# Patient Record
Sex: Female | Born: 1980 | Race: White | Hispanic: No | Marital: Married | Smoking: Never smoker
Health system: Southern US, Community
[De-identification: ages and names within clinical notes are randomized; demographics above are authoritative.]

## PROBLEM LIST (undated history)

## (undated) DIAGNOSIS — F32A Depression, unspecified: Secondary | ICD-10-CM

## (undated) DIAGNOSIS — Z6830 Body mass index (BMI) 30.0-30.9, adult: Secondary | ICD-10-CM

## (undated) DIAGNOSIS — T7840XA Allergy, unspecified, initial encounter: Secondary | ICD-10-CM

## (undated) DIAGNOSIS — M545 Low back pain, unspecified: Secondary | ICD-10-CM

## (undated) DIAGNOSIS — E559 Vitamin D deficiency, unspecified: Secondary | ICD-10-CM

## (undated) DIAGNOSIS — S0300XA Dislocation of jaw, unspecified side, initial encounter: Secondary | ICD-10-CM

## (undated) DIAGNOSIS — R739 Hyperglycemia, unspecified: Secondary | ICD-10-CM

## (undated) DIAGNOSIS — K5900 Constipation, unspecified: Secondary | ICD-10-CM

## (undated) DIAGNOSIS — Z5189 Encounter for other specified aftercare: Secondary | ICD-10-CM

## (undated) DIAGNOSIS — J302 Other seasonal allergic rhinitis: Secondary | ICD-10-CM

## (undated) DIAGNOSIS — L509 Urticaria, unspecified: Secondary | ICD-10-CM

## (undated) DIAGNOSIS — E041 Nontoxic single thyroid nodule: Secondary | ICD-10-CM

## (undated) DIAGNOSIS — L508 Other urticaria: Secondary | ICD-10-CM

## (undated) DIAGNOSIS — K219 Gastro-esophageal reflux disease without esophagitis: Secondary | ICD-10-CM

## (undated) DIAGNOSIS — F419 Anxiety disorder, unspecified: Secondary | ICD-10-CM

## (undated) HISTORY — DX: Low back pain, unspecified: M54.50

## (undated) HISTORY — DX: Low back pain: M54.5

## (undated) HISTORY — DX: Obesity, unspecified: E66.9

## (undated) HISTORY — DX: Urticaria, unspecified: L50.9

## (undated) HISTORY — DX: Constipation, unspecified: K59.00

## (undated) HISTORY — DX: Gastro-esophageal reflux disease without esophagitis: K21.9

## (undated) HISTORY — DX: Hyperlipidemia, unspecified: E78.5

## (undated) HISTORY — DX: Body mass index (BMI) 30.0-30.9, adult: Z68.30

## (undated) HISTORY — DX: Encounter for other specified aftercare: Z51.89

## (undated) HISTORY — DX: Chest pain, unspecified: R07.9

## (undated) HISTORY — DX: Depression, unspecified: F32.A

## (undated) HISTORY — DX: Other seasonal allergic rhinitis: J30.2

## (undated) HISTORY — DX: Vitamin D deficiency, unspecified: E55.9

## (undated) HISTORY — DX: Allergy, unspecified, initial encounter: T78.40XA

## (undated) HISTORY — DX: Hyperglycemia, unspecified: R73.9

## (undated) HISTORY — PX: SPINE SURGERY: SHX786

---
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
## 2004-09-19 HISTORY — PX: LUMBAR LAMINECTOMY: SHX95

## 2005-01-17 DIAGNOSIS — M51369 Other intervertebral disc degeneration, lumbar region without mention of lumbar back pain or lower extremity pain: Secondary | ICD-10-CM | POA: Insufficient documentation

## 2015-02-27 DIAGNOSIS — M519 Unspecified thoracic, thoracolumbar and lumbosacral intervertebral disc disorder: Secondary | ICD-10-CM | POA: Insufficient documentation

## 2015-04-20 DIAGNOSIS — R700 Elevated erythrocyte sedimentation rate: Secondary | ICD-10-CM | POA: Insufficient documentation

## 2015-10-12 ENCOUNTER — Other Ambulatory Visit: Payer: Self-pay | Admitting: Otolaryngology

## 2015-10-12 DIAGNOSIS — D34 Benign neoplasm of thyroid gland: Secondary | ICD-10-CM

## 2015-10-15 ENCOUNTER — Ambulatory Visit
Admission: RE | Admit: 2015-10-15 | Discharge: 2015-10-15 | Disposition: A | Discharge status: Home / Self Care | Payer: No Typology Code available for payment source | Source: Ambulatory Visit | Attending: Otolaryngology | Admitting: Otolaryngology

## 2015-10-15 DIAGNOSIS — D34 Benign neoplasm of thyroid gland: Secondary | ICD-10-CM

## 2015-10-19 ENCOUNTER — Other Ambulatory Visit: Payer: Self-pay

## 2015-11-10 ENCOUNTER — Other Ambulatory Visit (HOSPITAL_COMMUNITY): Payer: Self-pay | Admitting: Oncology

## 2015-11-10 DIAGNOSIS — F419 Anxiety disorder, unspecified: Secondary | ICD-10-CM | POA: Insufficient documentation

## 2015-11-10 DIAGNOSIS — E079 Disorder of thyroid, unspecified: Secondary | ICD-10-CM

## 2015-11-10 DIAGNOSIS — E0789 Other specified disorders of thyroid: Secondary | ICD-10-CM

## 2015-11-20 ENCOUNTER — Ambulatory Visit (HOSPITAL_COMMUNITY)
Admission: RE | Admit: 2015-11-20 | Discharge: 2015-11-20 | Disposition: A | Discharge status: Home / Self Care | Payer: PRIVATE HEALTH INSURANCE | Source: Ambulatory Visit | Attending: Oncology | Admitting: Oncology

## 2015-11-20 DIAGNOSIS — E079 Disorder of thyroid, unspecified: Secondary | ICD-10-CM

## 2015-11-20 DIAGNOSIS — E0789 Other specified disorders of thyroid: Secondary | ICD-10-CM | POA: Diagnosis not present

## 2015-11-20 MED ORDER — SODIUM PERTECHNETATE TC 99M INJECTION
11.0000 | Freq: Once | INTRAVENOUS | Status: AC | PRN
  Administered 2015-11-20: 11 via INTRAVENOUS

## 2015-11-26 ENCOUNTER — Other Ambulatory Visit (HOSPITAL_COMMUNITY): Payer: Self-pay | Admitting: Oncology

## 2015-11-26 ENCOUNTER — Other Ambulatory Visit: Payer: Self-pay | Admitting: Oncology

## 2015-11-26 DIAGNOSIS — E0789 Other specified disorders of thyroid: Secondary | ICD-10-CM

## 2015-11-26 DIAGNOSIS — E041 Nontoxic single thyroid nodule: Secondary | ICD-10-CM

## 2015-11-26 DIAGNOSIS — E079 Disorder of thyroid, unspecified: Secondary | ICD-10-CM

## 2015-12-03 ENCOUNTER — Ambulatory Visit
Admission: RE | Admit: 2015-12-03 | Discharge: 2015-12-03 | Disposition: A | Discharge status: Home / Self Care | Payer: PRIVATE HEALTH INSURANCE | Source: Ambulatory Visit | Attending: Oncology | Admitting: Oncology

## 2015-12-03 ENCOUNTER — Other Ambulatory Visit (HOSPITAL_COMMUNITY)
Admission: RE | Admit: 2015-12-03 | Discharge: 2015-12-03 | Disposition: A | Discharge status: Home / Self Care | Payer: PRIVATE HEALTH INSURANCE | Source: Ambulatory Visit | Attending: Interventional Radiology | Admitting: Interventional Radiology

## 2015-12-03 DIAGNOSIS — E041 Nontoxic single thyroid nodule: Secondary | ICD-10-CM | POA: Diagnosis present

## 2016-01-26 ENCOUNTER — Encounter: Payer: Self-pay | Admitting: Pediatrics

## 2016-01-26 ENCOUNTER — Ambulatory Visit (INDEPENDENT_AMBULATORY_CARE_PROVIDER_SITE_OTHER): Payer: PRIVATE HEALTH INSURANCE | Admitting: Pediatrics

## 2016-01-26 VITALS — BP 120/90 | HR 80 | Temp 98.7°F | Resp 16 | Ht 64.17 in | Wt 223.1 lb

## 2016-01-26 DIAGNOSIS — J301 Allergic rhinitis due to pollen: Secondary | ICD-10-CM | POA: Diagnosis not present

## 2016-01-26 DIAGNOSIS — L501 Idiopathic urticaria: Secondary | ICD-10-CM

## 2016-01-26 DIAGNOSIS — M1990 Unspecified osteoarthritis, unspecified site: Secondary | ICD-10-CM | POA: Insufficient documentation

## 2016-01-26 DIAGNOSIS — Z889 Allergy status to unspecified drugs, medicaments and biological substances status: Secondary | ICD-10-CM | POA: Insufficient documentation

## 2016-01-26 MED ORDER — EPINEPHRINE 0.3 MG/0.3ML IJ SOAJ: 0.3000 mg | Freq: Once | INTRAMUSCULAR | Status: DC

## 2016-01-26 NOTE — Progress Notes (Signed)
  [place] [id number] Dept: [phone]  FOLLOW UP NOTE  Patient ID: Christy Alexander, female    DOB: January 09, 1981  Age: 35 y.o. MRN: [id number] Date of Office Visit: 01/26/2016  Assessment Chief Complaint: Food/Drug Challenge and Food Intolerance  HPI Christy Alexander presents for evaluation of a drug allergy to nonsteroidal medications. She has been avoiding nonsteroidals medications. Several years ago after taking Naproxen, she developed swelling of the eyes and hoarseness but no hives,, difficulty swallowing or other allergic symptoms. She has had nasal congestion for several years which is worse in the springtime. She has a history of pressure related urticaria. She has developed arthritis and has been on prednisone 60 mg a day. She had a sedimentation rate of 26 mm/h, CRP 30.3, negative rheumatoid factor negative lupus screen and negative Sjogren's screening. . She did not stop her Zyrtec for 3 days before this visit.   Drug Allergies:  Allergies  Allergen Reactions  . Aspirin Swelling    hoarseness  . Chlorhexidine Dermatitis  . Nsaids Other (See Comments)    Eyes swelling/itching and loss of voice    Physical Exam: BP 120/90 mmHg  Pulse 80  Temp(Src) 98.7 F (37.1 C) (Oral)  Resp 16  Ht 5' 4.17" (1.63 m)  Wt 223 lb 1.7 oz (101.2 kg)  BMI 38.09 kg/m2   Physical Exam  Constitutional: She is oriented to person, place, and time. She appears well-developed and well-nourished.  HENT:  Eyes normal. Ears normal. Nose normal. Pharynx normal.  Neck: Neck supple. No thyromegaly present.  Cardiovascular:  S1 and S2 normal no murmurs  Pulmonary/Chest:  Clear to percussion and auscultation  Lymphadenopathy:    She has no cervical adenopathy.  Neurological: She is alert and oriented to person, place, and time.  Psychiatric: She has a normal mood and affect. Her behavior is normal. Judgment and thought content normal.  Vitals reviewed.   Diagnostics:    none Assessment and Plan: 1. Drug allergy   2. Idiopathic urticaria   3. Allergic rhinitis due to pollen   4. Arthritis         Patient Instructions  Stop Zyrtec 10 mg 3 days before the oral challenge to meloxicam. Do not take prednisone the day before the challenge or the day of the challenge    Return in about 6 days (around 02/01/2016).    Thank you for the opportunity to care for this patient.  Please do not hesitate to contact me with questions.  Penne Lash, M.D.  [facility] [address] [place], [place] [address] [phone]

## 2016-01-26 NOTE — Patient Instructions (Signed)
Stop Zyrtec 10 mg 3 days before the oral challenge to meloxicam. Do not take prednisone the day before the challenge or the day of the challenge

## 2016-01-26 NOTE — Addendum Note (Signed)
Addended by: Katherina Right D on: 01/26/2016 12:01 PM   Modules accepted: Orders

## 2016-01-29 ENCOUNTER — Encounter: Payer: PRIVATE HEALTH INSURANCE | Admitting: Pediatrics

## 2016-01-29 ENCOUNTER — Telehealth: Payer: Self-pay

## 2016-02-01 ENCOUNTER — Encounter: Payer: PRIVATE HEALTH INSURANCE | Admitting: Pediatrics

## 2016-02-02 NOTE — Telephone Encounter (Signed)
Spoke with patient about prednisone prior to her appointment with Dr. Shaune Leeks. Patient advised to take two 10mg  of prednisone BID if needed the day before appointment but none the day of.(per Dr. Shaune Leeks)

## 2016-03-08 DIAGNOSIS — K5909 Other constipation: Secondary | ICD-10-CM | POA: Insufficient documentation

## 2016-03-08 DIAGNOSIS — M519 Unspecified thoracic, thoracolumbar and lumbosacral intervertebral disc disorder: Secondary | ICD-10-CM | POA: Insufficient documentation

## 2016-03-08 DIAGNOSIS — R234 Changes in skin texture: Secondary | ICD-10-CM | POA: Insufficient documentation

## 2016-03-14 ENCOUNTER — Telehealth: Payer: Self-pay

## 2016-03-14 NOTE — Telephone Encounter (Signed)
Spoke to patient regarding coming in for the drug challenge to meloxicam in which she will be coming in on Friday at 8:30 am. She will stop the zyrtec and  Pt.hasn't had any prednisone since June 15th 2017. Pt. Will pick up meloxicam at the pharmacy and will bring for drug challenge. Dr. Shaune Leeks stated it was ok to bring pt. In to do the drug challenge.

## 2016-03-18 ENCOUNTER — Other Ambulatory Visit: Payer: Self-pay | Admitting: Pediatrics

## 2016-03-18 ENCOUNTER — Encounter: Payer: Self-pay | Admitting: Pediatrics

## 2016-03-18 ENCOUNTER — Ambulatory Visit (INDEPENDENT_AMBULATORY_CARE_PROVIDER_SITE_OTHER): Payer: PRIVATE HEALTH INSURANCE | Admitting: Pediatrics

## 2016-03-18 VITALS — BP 118/72 | HR 66 | Temp 98.0°F | Resp 20

## 2016-03-18 DIAGNOSIS — R109 Unspecified abdominal pain: Secondary | ICD-10-CM

## 2016-03-18 DIAGNOSIS — L503 Dermatographic urticaria: Secondary | ICD-10-CM | POA: Diagnosis not present

## 2016-03-18 DIAGNOSIS — Z889 Allergy status to unspecified drugs, medicaments and biological substances status: Secondary | ICD-10-CM

## 2016-03-18 NOTE — Patient Instructions (Addendum)
Zyrtec 10 mg once or twice a day if needed for itching or dermographia Do foods with salicylates make you itch? I will call you with the results of your  celiac antibody screen Meloxicam 7.5 mg once a day for 4 days. If you do well, increase meloxicam to 2 tablets equals 15 mg in the morning Call me if you are not doing well on this treatment plan

## 2016-03-18 NOTE — Progress Notes (Signed)
  [place] [address] Dept: [phone]  FOLLOW UP NOTE  Patient ID: Christy Alexander, female    DOB: 03/17/1981  Age: 35 y.o. MRN: [id number] Date of Office Visit: 03/18/2016  Assessment Chief Complaint: Food/Drug Challenge  HPI Christy Alexander presents for a gradual oral challenge to meloxicam.. When she was able to stop Zyrtec , she has noted some itching. Her other medications are outlined in the chart. She has been without any prednisone for about a week.. She has been having abdominal pain and is concerned that she may have celiac disease   Drug Allergies:  Allergies  Allergen Reactions  . Aspirin Swelling    hoarseness  . Chlorhexidine Dermatitis  . Nsaids Other (See Comments)    Eyes swelling/itching and loss of voice    Physical Exam: BP 118/72 mmHg  Pulse 66  Temp(Src) 98 F (36.7 C) (Oral)  Resp 20  SpO2 96%   Physical Exam  Constitutional: She appears well-developed and well-nourished.  HENT:  Eyes normal. Ears normal. Nose normal. Pharynx normal.  Neck: Neck supple.  Cardiovascular:  S1 and S2 normal no murmurs  Pulmonary/Chest:  Clear to percussion auscultation  Lymphadenopathy:    She has no cervical adenopathy.  Skin:  She had 2 erythematous areas around her neck. She also has dermographia  Psychiatric: She has a normal mood and affect. Her behavior is normal. Judgment and thought content normal.    Diagnostics:  She was given meloxicam 3.75 mg and 2 hours later 7.5 mg and she tolerated this challenge without any problems  Assessment and Plan: 1. Drug allergy   2. Abdominal pain, unspecified abdominal location   3. Dermographia         Patient Instructions  Zyrtec 10 mg once or twice a day if needed for itching or dermographia Do foods with salicylates make you itch? I will call you with the results of your  celiac antibody screen Meloxicam 7.5 mg once a day for 4 days. If you do well, increase meloxicam to 2 tablets equals  15 mg in the morning Call me if you are not doing well on this treatment plan    Return if symptoms worsen or fail to improve.    Thank you for the opportunity to care for this patient.  Please do not hesitate to contact me with questions.  Penne Lash, M.D.  [facility] [address] [place], [place] [address] [phone]

## 2016-03-22 LAB — CELIAC PANEL 10
Antigliadin Abs, IgA: 6 units (ref 0–19)
Endomysial IgA: NEGATIVE
Gliadin IgG: 2 units (ref 0–19)
IgA/Immunoglobulin A, Serum: 278 mg/dL (ref 87–352)
Tissue Transglut Ab: <2 U/mL (ref 0–5)
Transglutaminase IgA: <2 U/mL (ref 0–3)

## 2016-03-23 ENCOUNTER — Telehealth: Payer: Self-pay | Admitting: Allergy and Immunology

## 2016-03-23 NOTE — Progress Notes (Signed)
Oral challenge test to meloxicam was negative

## 2016-03-23 NOTE — Telephone Encounter (Signed)
Please call patient back with lab work.

## 2016-03-24 NOTE — Telephone Encounter (Signed)
Informed patient of blood test results. 

## 2016-04-28 ENCOUNTER — Encounter: Payer: Self-pay | Admitting: Allergy and Immunology

## 2016-05-10 ENCOUNTER — Other Ambulatory Visit: Payer: Self-pay | Admitting: Surgery

## 2016-05-10 DIAGNOSIS — K219 Gastro-esophageal reflux disease without esophagitis: Secondary | ICD-10-CM | POA: Insufficient documentation

## 2016-05-10 DIAGNOSIS — J302 Other seasonal allergic rhinitis: Secondary | ICD-10-CM | POA: Insufficient documentation

## 2016-05-10 DIAGNOSIS — E041 Nontoxic single thyroid nodule: Secondary | ICD-10-CM

## 2016-05-10 DIAGNOSIS — E669 Obesity, unspecified: Secondary | ICD-10-CM | POA: Insufficient documentation

## 2016-05-11 ENCOUNTER — Ambulatory Visit
Admission: RE | Admit: 2016-05-11 | Discharge: 2016-05-11 | Disposition: A | Discharge status: Home / Self Care | Payer: PRIVATE HEALTH INSURANCE | Source: Ambulatory Visit | Attending: Surgery | Admitting: Surgery

## 2016-05-11 DIAGNOSIS — E041 Nontoxic single thyroid nodule: Secondary | ICD-10-CM

## 2016-06-30 DIAGNOSIS — R7982 Elevated C-reactive protein (CRP): Secondary | ICD-10-CM | POA: Insufficient documentation

## 2016-06-30 DIAGNOSIS — M2550 Pain in unspecified joint: Secondary | ICD-10-CM | POA: Insufficient documentation

## 2016-08-18 ENCOUNTER — Telehealth: Payer: Self-pay

## 2016-08-18 NOTE — Telephone Encounter (Signed)
Patient has mold in home.  She wants to know if you can do any additional testing to see if she has been exposed to toxic mold.   Patient has seen rheumatologist for high ESR and CRP, joint pain, hair loss, dry mouth and dry eyes. All testing from rheumatologist Dr. Scarlette Shorts in [place], [place]  has been normal.  Patient has been to [facility] for a lot of testing.  All testing has been normal.  Autoimmune, opthalmology testing and all blood work is normal . Please advise.  Prefers blood testing.

## 2016-08-22 NOTE — Telephone Encounter (Signed)
Patient does want to have a blood test for mold hypersensitivity screen done.  Please mail lab order to her home.  What test do I order?

## 2016-08-22 NOTE — Telephone Encounter (Signed)
Agree 

## 2016-08-22 NOTE — Telephone Encounter (Signed)
Left message for patient to call back  

## 2016-08-22 NOTE — Telephone Encounter (Signed)
She could have a blood test for a mold hypersensitivity screen

## 2016-08-22 NOTE — Telephone Encounter (Addendum)
Per Dr. Shaune Leeks, we cannot order Hypersensitivity Pneumonitis.  We have only seen patient for possible NSAID drug intolerance.  Patient will make an OV with Dr. Shaune Leeks to discuss possible mold allergy, evaluation and treat.

## 2016-11-21 DIAGNOSIS — E785 Hyperlipidemia, unspecified: Secondary | ICD-10-CM | POA: Insufficient documentation

## 2017-03-27 DIAGNOSIS — N94819 Vulvodynia, unspecified: Secondary | ICD-10-CM | POA: Diagnosis not present

## 2017-03-27 DIAGNOSIS — E041 Nontoxic single thyroid nodule: Secondary | ICD-10-CM | POA: Diagnosis not present

## 2017-05-02 ENCOUNTER — Other Ambulatory Visit: Payer: Self-pay | Admitting: Surgery

## 2017-05-02 DIAGNOSIS — E042 Nontoxic multinodular goiter: Secondary | ICD-10-CM

## 2017-05-08 DIAGNOSIS — E042 Nontoxic multinodular goiter: Secondary | ICD-10-CM | POA: Diagnosis not present

## 2017-05-16 DIAGNOSIS — R5383 Other fatigue: Secondary | ICD-10-CM | POA: Diagnosis not present

## 2017-05-16 DIAGNOSIS — R7982 Elevated C-reactive protein (CRP): Secondary | ICD-10-CM | POA: Diagnosis not present

## 2017-05-16 DIAGNOSIS — E041 Nontoxic single thyroid nodule: Secondary | ICD-10-CM | POA: Diagnosis not present

## 2017-05-16 DIAGNOSIS — M2550 Pain in unspecified joint: Secondary | ICD-10-CM | POA: Diagnosis not present

## 2017-05-16 DIAGNOSIS — M25562 Pain in left knee: Secondary | ICD-10-CM | POA: Diagnosis not present

## 2017-05-29 DIAGNOSIS — K219 Gastro-esophageal reflux disease without esophagitis: Secondary | ICD-10-CM | POA: Diagnosis not present

## 2017-05-29 DIAGNOSIS — E041 Nontoxic single thyroid nodule: Secondary | ICD-10-CM | POA: Diagnosis not present

## 2017-05-29 DIAGNOSIS — J302 Other seasonal allergic rhinitis: Secondary | ICD-10-CM | POA: Diagnosis not present

## 2017-05-29 DIAGNOSIS — M2550 Pain in unspecified joint: Secondary | ICD-10-CM | POA: Insufficient documentation

## 2017-05-29 DIAGNOSIS — K5909 Other constipation: Secondary | ICD-10-CM | POA: Diagnosis not present

## 2017-05-29 DIAGNOSIS — Z6839 Body mass index (BMI) 39.0-39.9, adult: Secondary | ICD-10-CM | POA: Diagnosis not present

## 2017-05-29 DIAGNOSIS — Z01419 Encounter for gynecological examination (general) (routine) without abnormal findings: Secondary | ICD-10-CM | POA: Diagnosis not present

## 2017-05-29 DIAGNOSIS — Z789 Other specified health status: Secondary | ICD-10-CM | POA: Diagnosis not present

## 2017-05-29 DIAGNOSIS — Z0000 Encounter for general adult medical examination without abnormal findings: Secondary | ICD-10-CM | POA: Diagnosis not present

## 2017-05-29 DIAGNOSIS — H532 Diplopia: Secondary | ICD-10-CM | POA: Diagnosis not present

## 2017-05-29 DIAGNOSIS — E669 Obesity, unspecified: Secondary | ICD-10-CM | POA: Diagnosis not present

## 2017-05-29 DIAGNOSIS — E785 Hyperlipidemia, unspecified: Secondary | ICD-10-CM | POA: Diagnosis not present

## 2017-05-30 ENCOUNTER — Ambulatory Visit
Admission: RE | Admit: 2017-05-30 | Discharge: 2017-05-30 | Disposition: A | Discharge status: Home / Self Care | Payer: 59 | Source: Ambulatory Visit | Attending: Surgery | Admitting: Surgery

## 2017-05-30 DIAGNOSIS — E041 Nontoxic single thyroid nodule: Secondary | ICD-10-CM | POA: Diagnosis not present

## 2017-05-30 DIAGNOSIS — E042 Nontoxic multinodular goiter: Secondary | ICD-10-CM

## 2017-06-06 DIAGNOSIS — E041 Nontoxic single thyroid nodule: Secondary | ICD-10-CM | POA: Diagnosis not present

## 2017-08-31 ENCOUNTER — Telehealth: Payer: 59 | Admitting: Physician Assistant

## 2017-08-31 DIAGNOSIS — R300 Dysuria: Secondary | ICD-10-CM

## 2017-08-31 MED ORDER — CEPHALEXIN 500 MG PO CAPS: 500.0000 mg | ORAL_CAPSULE | Freq: Two times a day (BID) | ORAL | 0 refills | Status: AC

## 2017-08-31 NOTE — Progress Notes (Signed)

## 2017-09-06 DIAGNOSIS — H5213 Myopia, bilateral: Secondary | ICD-10-CM | POA: Diagnosis not present

## 2017-09-06 DIAGNOSIS — H532 Diplopia: Secondary | ICD-10-CM | POA: Diagnosis not present

## 2017-09-06 DIAGNOSIS — H43393 Other vitreous opacities, bilateral: Secondary | ICD-10-CM | POA: Diagnosis not present

## 2017-09-18 DIAGNOSIS — M542 Cervicalgia: Secondary | ICD-10-CM | POA: Diagnosis not present

## 2017-09-18 DIAGNOSIS — S139XXA Sprain of joints and ligaments of unspecified parts of neck, initial encounter: Secondary | ICD-10-CM | POA: Diagnosis not present

## 2017-09-18 DIAGNOSIS — M5090 Cervical disc disorder, unspecified, unspecified cervical region: Secondary | ICD-10-CM | POA: Diagnosis not present

## 2017-09-24 ENCOUNTER — Telehealth: Payer: 59 | Admitting: Family

## 2017-09-24 DIAGNOSIS — J019 Acute sinusitis, unspecified: Secondary | ICD-10-CM | POA: Diagnosis not present

## 2017-09-24 MED ORDER — AMOXICILLIN-POT CLAVULANATE 875-125 MG PO TABS: 1.0000 | ORAL_TABLET | Freq: Two times a day (BID) | ORAL | 0 refills | Status: DC

## 2017-09-24 NOTE — Progress Notes (Signed)
Disregard

## 2017-09-24 NOTE — Progress Notes (Signed)

## 2017-10-27 DIAGNOSIS — M542 Cervicalgia: Secondary | ICD-10-CM | POA: Diagnosis not present

## 2017-10-27 DIAGNOSIS — R7982 Elevated C-reactive protein (CRP): Secondary | ICD-10-CM | POA: Diagnosis not present

## 2017-10-27 DIAGNOSIS — M2550 Pain in unspecified joint: Secondary | ICD-10-CM | POA: Diagnosis not present

## 2017-12-13 DIAGNOSIS — E782 Mixed hyperlipidemia: Secondary | ICD-10-CM | POA: Diagnosis not present

## 2017-12-13 DIAGNOSIS — Z6841 Body Mass Index (BMI) 40.0 and over, adult: Secondary | ICD-10-CM | POA: Diagnosis not present

## 2017-12-13 DIAGNOSIS — M2550 Pain in unspecified joint: Secondary | ICD-10-CM | POA: Diagnosis not present

## 2017-12-13 DIAGNOSIS — E041 Nontoxic single thyroid nodule: Secondary | ICD-10-CM | POA: Diagnosis not present

## 2017-12-26 ENCOUNTER — Ambulatory Visit: Payer: Self-pay | Admitting: Surgery

## 2017-12-26 DIAGNOSIS — E041 Nontoxic single thyroid nodule: Secondary | ICD-10-CM | POA: Diagnosis not present

## 2017-12-28 ENCOUNTER — Other Ambulatory Visit: Payer: Self-pay | Admitting: Surgery

## 2017-12-28 DIAGNOSIS — E042 Nontoxic multinodular goiter: Secondary | ICD-10-CM

## 2018-01-03 ENCOUNTER — Encounter (HOSPITAL_COMMUNITY): Payer: Self-pay | Admitting: *Deleted

## 2018-01-08 ENCOUNTER — Ambulatory Visit
Admission: RE | Admit: 2018-01-08 | Discharge: 2018-01-08 | Disposition: A | Discharge status: Home / Self Care | Payer: 59 | Source: Ambulatory Visit | Attending: Surgery | Admitting: Surgery

## 2018-01-08 DIAGNOSIS — E041 Nontoxic single thyroid nodule: Secondary | ICD-10-CM | POA: Diagnosis not present

## 2018-01-08 DIAGNOSIS — E042 Nontoxic multinodular goiter: Secondary | ICD-10-CM

## 2018-01-25 NOTE — Patient Instructions (Addendum)
Christy Alexander  01/25/2018   Your procedure is scheduled on: 02-01-18  Report to [facility] Main  Entrance  Report to admitting at 530  AM    Call this number if you have problems the morning of surgery [phone]   Remember: Do not eat food or drink liquids :After Midnight.     Take these medicines the morning of surgery with A SIP OF WATER: NONE                               You may not have any metal on your body including hair pins and              piercings  Do not wear jewelry, make-up, lotions, powders or perfumes, deodorant             Do not wear nail polish.  Do not shave  48 hours prior to surgery.              Do not bring valuables to the hospital. [place].  Contacts, dentures or bridgework may not be worn into surgery.  Leave suitcase in the car. After surgery it may be brought to your room.                   Please read over the following fact sheets you were given: _____________________________________________________________________             [facility] - Preparing for Surgery Before surgery, you can play an important role.  Because skin is not sterile, your skin needs to be as free of germs as possible.  You can reduce the number of germs on your skin by washing with DIAL   soap before surgery. DIALis an antiseptic cleaner which kills germs and bonds with the skin to continue killing germs even after washing. Please DO NOT use if you have an allergy to DIALor antibacterial soaps.  If your skin becomes reddened/irritated stop using theDIAL and inform your nurse when you arrive at Short Stay. Do not shave (including legs and underarms) for at least 48 hours prior to the first DIAL shower.  You may shave your face/neck. Please follow these instructions carefully:  1.  Shower with DIAL Soap the night before surgery and the  morning of Surgery.  2.  If you choose to wash your hair, wash your  hair first as usual with your  normal  shampoo.  3.  After you shampoo, rinse your hair and body thoroughly to remove the  shampoo.                           4.  Use DIAL as you would any other liquid soap.  You can apply chg directly  to the skin and wash                       Gently with a scrungie or clean washcloth.  5.  Apply the DIAL Soap to your body ONLY FROM THE NECK DOWN.   Do not use on face/ open  Wound or open sores. Avoid contact with eyes, ears mouth and genitals (private parts).                       Wash face,  Genitals (private parts) with your normal soap.             6.  Wash thoroughly, paying special attention to the area where your surgery  will be performed.  7.  Thoroughly rinse your body with warm water from the neck down.  8.  DO NOT shower/wash with your normal soap after using and rinsing off  the DIAL Soap.                9.  Pat yourself dry with a clean towel.            10.  Wear clean pajamas.            11.  Place clean sheets on your bed the night of your first shower and do not  sleep with pets. Day of Surgery : Do not apply any lotions/deodorants the morning of surgery.  Please wear clean clothes to the hospital/surgery center.  FAILURE TO FOLLOW THESE INSTRUCTIONS MAY RESULT IN THE CANCELLATION OF YOUR SURGERY PATIENT SIGNATURE_________________________________  NURSE SIGNATURE__________________________________  ________________________________________________________________________

## 2018-01-26 ENCOUNTER — Encounter (HOSPITAL_COMMUNITY): Payer: Self-pay | Admitting: *Deleted

## 2018-01-26 ENCOUNTER — Ambulatory Visit (HOSPITAL_COMMUNITY)
Admission: RE | Admit: 2018-01-26 | Discharge: 2018-01-26 | Disposition: A | Discharge status: Home / Self Care | Payer: 59 | Source: Ambulatory Visit | Attending: Anesthesiology | Admitting: Anesthesiology

## 2018-01-26 ENCOUNTER — Encounter (HOSPITAL_COMMUNITY)
Admission: RE | Admit: 2018-01-26 | Discharge: 2018-01-26 | Disposition: A | Discharge status: Home / Self Care | Payer: 59 | Source: Ambulatory Visit | Attending: Surgery | Admitting: Surgery

## 2018-01-26 ENCOUNTER — Other Ambulatory Visit: Payer: Self-pay

## 2018-01-26 DIAGNOSIS — Z01818 Encounter for other preprocedural examination: Secondary | ICD-10-CM | POA: Diagnosis not present

## 2018-01-26 DIAGNOSIS — J398 Other specified diseases of upper respiratory tract: Secondary | ICD-10-CM | POA: Diagnosis not present

## 2018-01-26 DIAGNOSIS — E041 Nontoxic single thyroid nodule: Secondary | ICD-10-CM | POA: Diagnosis not present

## 2018-01-26 HISTORY — DX: Low back pain, unspecified: M54.50

## 2018-01-26 HISTORY — DX: Nontoxic single thyroid nodule: E04.1

## 2018-01-26 HISTORY — DX: Low back pain: M54.5

## 2018-01-26 HISTORY — DX: Gastro-esophageal reflux disease without esophagitis: K21.9

## 2018-01-26 HISTORY — DX: Other urticaria: L50.8

## 2018-01-26 HISTORY — DX: Anxiety disorder, unspecified: F41.9

## 2018-01-26 LAB — CBC
HCT: 42.4 % (ref 36.0–46.0)
Hemoglobin: 14 g/dL (ref 12.0–15.0)
MCH: 29.6 pg (ref 26.0–34.0)
MCHC: 33 g/dL (ref 30.0–36.0)
MCV: 89.6 fL (ref 78.0–100.0)
Platelets: 286 10*3/uL (ref 150–400)
RBC: 4.73 MIL/uL (ref 3.87–5.11)
RDW: 12.9 % (ref 11.5–15.5)
WBC: 8.1 10*3/uL (ref 4.0–10.5)

## 2018-01-26 LAB — HCG, SERUM, QUALITATIVE: Preg, Serum: NEGATIVE

## 2018-01-26 NOTE — Consult Note (Signed)
Was asked to see Christy Alexander as a preoperative consult for her Delayed Pressure Urticaria, she suffers from. 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 It has only involved her fingers and lower extremeties but she is concerned about her surgery and intubation causing this reaction.  Overweight female with a normal airway. She does have some TMJ symptoms. She brought some case reports with her, which suggest pretreatment with steroids, H1 and H2 blockers.  I answered all her questions and discussed her case with Dr Roanna [place] who agreed to care for her as he is scheduled at [facility] next Thursday.  Lillia Abed MD

## 2018-01-27 ENCOUNTER — Encounter (HOSPITAL_COMMUNITY): Payer: Self-pay | Admitting: Surgery

## 2018-01-27 DIAGNOSIS — E041 Nontoxic single thyroid nodule: Secondary | ICD-10-CM | POA: Diagnosis present

## 2018-01-27 NOTE — H&P (Signed)
[facility]  Christy Alexander DOB: [birth year] Married / Language: English / Race: [place] Female   History of Present Illness  The patient is a 38 year old female who presents with a thyroid nodule.  CC: dominant thyroid nodule with compressive symptoms  Patient returns for scheduled follow-up. She has a known right sided dominant thyroid nodule which measures nearly 5 cm in dimension. Left thyroid lobe is relatively normal with only a small cyst noted on previous ultrasound examinations. She has not had a recent ultrasound study. Patient was last evaluated in September 2018. At that time she was having mild to moderate compressive symptoms. Over the past 6 months she has noted increased symptoms including dysphagia and she has developed some discomfort in the right neck. She notes positional discomfort in the right neck. At this time she would like to proceed with thyroid surgery. We had previously discussed right thyroid lobectomy. However, the patient is concerned about the cyst noted in the left thyroid lobe on previous examinations. She would like to have her thyroid ultrasound repeated prior to surgery. We will arrange for that study in the near future. TSH level has been normal. Patient does not take thyroid hormone supplementation.   Problem List/Past Medical  MULTIPLE THYROID NODULES (E04.2)  RIGHT THYROID NODULE (E04.1)   Past Surgical History Oral Surgery  Spinal Surgery - Lower Back   Diagnostic Studies History Colonoscopy  1-5 years ago Mammogram  never Pap Smear  1-5 years ago  Allergies Aspirin *ANALGESICS - NonNarcotic*  NSAIDs  Chlorhexidine *PHARMACEUTICAL ADJUVANTS*  Allergies Reconciled   Medication History Mobic (7.5MG  Tablet, Oral as needed) Active. Vitamin C (Oral) Specific strength unknown - Active. EPINEPHrine (0.3MG /0.3ML Soln Auto-inj, Injection as needed) Active. ZyrTEC Allergy (10MG   Capsule, Oral) Active. Docusate Calcium (240MG  Capsule, Oral) Active. Medications Reconciled  Social History Caffeine use  Coffee, Tea. No alcohol use  No drug use  Tobacco use  Never smoker.  Family History Anesthetic complications  Father. Cancer  Father. Diabetes Mellitus  Brother, Mother. Heart Disease  Father, Mother. Heart disease in female family member before age 85  Hypertension  Father, Mother. Ischemic Bowel Disease  Family Members In General.  Pregnancy / Birth History Age at menarche  12 years. Contraceptive History  Oral contraceptives. Gravida  0 Irregular periods  Para  0  Other Problems Anxiety Disorder  Arthritis  Back Pain  Gastroesophageal Reflux Disease  Hemorrhoids  Thyroid Disease   Vitals Weight: 239.2 lb Height: 64in Body Surface Area: 2.11 m Body Mass Index: 41.06 kg/m  Temp.: 98.1F  Pulse: 102 (Regular)  BP: 124/86 (Sitting, Left Arm, Standard)  Physical Exam  See vital signs recorded above  GENERAL APPEARANCE Development: normal Nutritional status: normal Gross deformities: none  SKIN Rash, lesions, ulcers: none Induration, erythema: none Nodules: none palpable  EYES Conjunctiva and lids: normal Pupils: equal and reactive Iris: normal bilaterally  EARS, NOSE, MOUTH, THROAT External ears: no lesion or deformity External nose: no lesion or deformity Hearing: grossly normal Lips: no lesion or deformity Dentition: normal for age Oral mucosa: moist  NECK Symmetric: no Trachea: midline Thyroid: Dominant mass occupying most of the right thyroid lobe, smooth, firm, approximately 5 cm in size, mobile with swallowing. Left thyroid lobe without palpable abnormality.  CHEST Respiratory effort: normal Retraction or accessory muscle use: no Breath sounds: normal bilaterally Rales, rhonchi, wheeze: none  CARDIOVASCULAR Auscultation: regular rhythm, normal rate Murmurs: none Pulses:  carotid and radial pulse 2+  palpable Lower extremity edema: none Lower extremity varicosities: none  MUSCULOSKELETAL Station and gait: normal Digits and nails: no clubbing or cyanosis Muscle strength: grossly normal all extremities Range of motion: grossly normal all extremities Deformity: none  LYMPHATIC Cervical: none palpable Supraclavicular: none palpable  PSYCHIATRIC Oriented to person, place, and time: yes Mood and affect: normal for situation Judgment and insight: appropriate for situation    Assessment & Plan  RIGHT THYROID NODULE (E04.1)  Patient returns with a dominant right sided thyroid nodule and progression in her compressive symptoms. We discussed proceeding with either thyroid lobectomy or total thyroidectomy. Patient would like to repeat her thyroid ultrasound prior to making a final decision on the surgical procedure. We will arrange for that study and the near future and intact the patient with results when they are available.  We discussed thyroid lobectomy versus total thyroidectomy. We discussed the risk and benefits of the procedure including the risk of recurrent laryngeal nerve injury and injury to parathyroid glands. We discussed the hospital stay to be anticipated. We discussed the location and size of her surgical incision. We discussed the potential need for lifelong thyroid hormone replacement. Patient understands and wishes to proceed with surgery in the near future.  Patient has concerns regarding reactive airway and immune mediated soft tissue swelling which could lead to airway compromise. We will arrange for preoperative anesthesia consultation to discuss these issues prior to surgery.  The risks and benefits of the procedure have been discussed at length with the patient. The patient understands the proposed procedure, potential alternative treatments, and the course of recovery to be expected. All of the patient's questions have been  answered at this time. The patient wishes to proceed with surgery.   Christy Alexander, [place] [facility] Office: [phone]

## 2018-01-30 ENCOUNTER — Encounter (HOSPITAL_COMMUNITY): Payer: Self-pay | Admitting: *Deleted

## 2018-02-01 ENCOUNTER — Encounter (HOSPITAL_COMMUNITY): Payer: Self-pay | Admitting: *Deleted

## 2018-02-01 ENCOUNTER — Observation Stay (HOSPITAL_COMMUNITY)
Admission: RE | Admit: 2018-02-01 | Discharge: 2018-02-02 | Disposition: A | Discharge status: Home / Self Care | Payer: 59 | Source: Ambulatory Visit | Attending: Surgery | Admitting: Surgery

## 2018-02-01 ENCOUNTER — Ambulatory Visit (HOSPITAL_COMMUNITY): Payer: 59 | Admitting: Anesthesiology

## 2018-02-01 ENCOUNTER — Encounter (HOSPITAL_COMMUNITY)
Admission: RE | Disposition: A | Discharge status: Home / Self Care | Payer: Self-pay | Source: Ambulatory Visit | Attending: Surgery

## 2018-02-01 ENCOUNTER — Other Ambulatory Visit: Payer: Self-pay

## 2018-02-01 DIAGNOSIS — F419 Anxiety disorder, unspecified: Secondary | ICD-10-CM | POA: Insufficient documentation

## 2018-02-01 DIAGNOSIS — L501 Idiopathic urticaria: Secondary | ICD-10-CM | POA: Insufficient documentation

## 2018-02-01 DIAGNOSIS — M1990 Unspecified osteoarthritis, unspecified site: Secondary | ICD-10-CM | POA: Insufficient documentation

## 2018-02-01 DIAGNOSIS — E041 Nontoxic single thyroid nodule: Principal | ICD-10-CM | POA: Diagnosis present

## 2018-02-01 DIAGNOSIS — Z888 Allergy status to other drugs, medicaments and biological substances status: Secondary | ICD-10-CM | POA: Diagnosis not present

## 2018-02-01 DIAGNOSIS — K219 Gastro-esophageal reflux disease without esophagitis: Secondary | ICD-10-CM | POA: Diagnosis not present

## 2018-02-01 DIAGNOSIS — Z6841 Body Mass Index (BMI) 40.0 and over, adult: Secondary | ICD-10-CM | POA: Diagnosis not present

## 2018-02-01 DIAGNOSIS — Z886 Allergy status to analgesic agent status: Secondary | ICD-10-CM | POA: Insufficient documentation

## 2018-02-01 DIAGNOSIS — Z79899 Other long term (current) drug therapy: Secondary | ICD-10-CM | POA: Insufficient documentation

## 2018-02-01 DIAGNOSIS — D34 Benign neoplasm of thyroid gland: Secondary | ICD-10-CM | POA: Diagnosis not present

## 2018-02-01 DIAGNOSIS — Z91041 Radiographic dye allergy status: Secondary | ICD-10-CM | POA: Insufficient documentation

## 2018-02-01 HISTORY — PX: THYROID LOBECTOMY: SHX420

## 2018-02-01 HISTORY — DX: Dislocation of jaw, unspecified side, initial encounter: S03.00XA

## 2018-02-01 SURGERY — LOBECTOMY, THYROID
Anesthesia: General | Site: Neck | Laterality: Right

## 2018-02-01 MED ORDER — HYDROMORPHONE HCL 1 MG/ML IJ SOLN
INTRAMUSCULAR | Status: AC
  Filled 2018-02-01: qty 1

## 2018-02-01 MED ORDER — PHENYLEPHRINE 40 MCG/ML (10ML) SYRINGE FOR IV PUSH (FOR BLOOD PRESSURE SUPPORT)
PREFILLED_SYRINGE | INTRAVENOUS | Status: AC
  Filled 2018-02-01: qty 10

## 2018-02-01 MED ORDER — ROCURONIUM BROMIDE 10 MG/ML (PF) SYRINGE
PREFILLED_SYRINGE | INTRAVENOUS | Status: AC
  Filled 2018-02-01: qty 5

## 2018-02-01 MED ORDER — FENTANYL CITRATE (PF) 100 MCG/2ML IJ SOLN
INTRAMUSCULAR | Status: DC | PRN
  Administered 2018-02-01 (×2): 50 ug via INTRAVENOUS
  Administered 2018-02-01: 150 ug via INTRAVENOUS
  Administered 2018-02-01 (×2): 50 ug via INTRAVENOUS

## 2018-02-01 MED ORDER — ONDANSETRON HCL 4 MG/2ML IJ SOLN
4.0000 mg | Freq: Four times a day (QID) | INTRAMUSCULAR | Status: DC | PRN
  Administered 2018-02-01: 4 mg via INTRAVENOUS
  Filled 2018-02-01: qty 2

## 2018-02-01 MED ORDER — DIPHENHYDRAMINE HCL 50 MG/ML IJ SOLN
25.0000 mg | Freq: Once | INTRAMUSCULAR | Status: AC
  Administered 2018-02-01: 07:00:00 via INTRAVENOUS
  Filled 2018-02-01: qty 1

## 2018-02-01 MED ORDER — DEXAMETHASONE SODIUM PHOSPHATE 10 MG/ML IJ SOLN
INTRAMUSCULAR | Status: DC | PRN
  Administered 2018-02-01: 10 mg via INTRAVENOUS

## 2018-02-01 MED ORDER — ACETAMINOPHEN 500 MG PO TABS
1000.0000 mg | ORAL_TABLET | Freq: Once | ORAL | Status: AC
  Administered 2018-02-01: 1000 mg via ORAL
  Filled 2018-02-01: qty 2

## 2018-02-01 MED ORDER — EPHEDRINE 5 MG/ML INJ
INTRAVENOUS | Status: AC
  Filled 2018-02-01: qty 10

## 2018-02-01 MED ORDER — ACETAMINOPHEN 650 MG RE SUPP: 650.0000 mg | Freq: Four times a day (QID) | RECTAL | Status: DC | PRN

## 2018-02-01 MED ORDER — HYDROMORPHONE HCL 1 MG/ML IJ SOLN
1.0000 mg | INTRAMUSCULAR | Status: DC | PRN
  Administered 2018-02-01: 1 mg via INTRAVENOUS

## 2018-02-01 MED ORDER — CEFAZOLIN SODIUM-DEXTROSE 2-4 GM/100ML-% IV SOLN
2.0000 g | INTRAVENOUS | Status: AC
  Administered 2018-02-01: 2 g via INTRAVENOUS
  Filled 2018-02-01: qty 100

## 2018-02-01 MED ORDER — ONDANSETRON HCL 4 MG/2ML IJ SOLN
INTRAMUSCULAR | Status: AC
  Filled 2018-02-01: qty 2

## 2018-02-01 MED ORDER — HYDROMORPHONE HCL 1 MG/ML IJ SOLN
0.2500 mg | INTRAMUSCULAR | Status: DC | PRN
  Administered 2018-02-01 (×4): 0.5 mg via INTRAVENOUS
  Filled 2018-02-01: qty 1

## 2018-02-01 MED ORDER — HYDROMORPHONE HCL 1 MG/ML IJ SOLN
INTRAMUSCULAR | Status: AC
  Administered 2018-02-01: 0.5 mg via INTRAVENOUS
  Filled 2018-02-01: qty 2

## 2018-02-01 MED ORDER — MIDAZOLAM HCL 5 MG/5ML IJ SOLN
INTRAMUSCULAR | Status: DC | PRN
  Administered 2018-02-01: 1 mg via INTRAVENOUS

## 2018-02-01 MED ORDER — 0.9 % SODIUM CHLORIDE (POUR BTL) OPTIME
TOPICAL | Status: DC | PRN
  Administered 2018-02-01: 1000 mL

## 2018-02-01 MED ORDER — PROPOFOL 10 MG/ML IV BOLUS
INTRAVENOUS | Status: AC
  Filled 2018-02-01: qty 20

## 2018-02-01 MED ORDER — SUCCINYLCHOLINE CHLORIDE 200 MG/10ML IV SOSY
PREFILLED_SYRINGE | INTRAVENOUS | Status: AC
  Filled 2018-02-01: qty 10

## 2018-02-01 MED ORDER — LIDOCAINE 2% (20 MG/ML) 5 ML SYRINGE
INTRAMUSCULAR | Status: AC
  Filled 2018-02-01: qty 5

## 2018-02-01 MED ORDER — PROPOFOL 10 MG/ML IV BOLUS
INTRAVENOUS | Status: DC | PRN
  Administered 2018-02-01: 200 mg via INTRAVENOUS

## 2018-02-01 MED ORDER — CHLORHEXIDINE GLUCONATE CLOTH 2 % EX PADS: 6.0000 | MEDICATED_PAD | Freq: Once | CUTANEOUS | Status: DC

## 2018-02-01 MED ORDER — TRAMADOL HCL 50 MG PO TABS: 50.0000 mg | ORAL_TABLET | Freq: Four times a day (QID) | ORAL | Status: DC | PRN

## 2018-02-01 MED ORDER — OXYCODONE HCL 5 MG/5ML PO SOLN
5.0000 mg | Freq: Once | ORAL | Status: AC | PRN
  Filled 2018-02-01: qty 5

## 2018-02-01 MED ORDER — SUGAMMADEX SODIUM 200 MG/2ML IV SOLN
INTRAVENOUS | Status: AC
  Filled 2018-02-01: qty 2

## 2018-02-01 MED ORDER — ONDANSETRON 4 MG PO TBDP: 4.0000 mg | ORAL_TABLET | Freq: Four times a day (QID) | ORAL | Status: DC | PRN

## 2018-02-01 MED ORDER — OXYCODONE HCL 5 MG PO TABS
5.0000 mg | ORAL_TABLET | Freq: Once | ORAL | Status: AC | PRN
  Administered 2018-02-01: 5 mg via ORAL
  Filled 2018-02-01: qty 1

## 2018-02-01 MED ORDER — ONDANSETRON HCL 4 MG/2ML IJ SOLN
INTRAMUSCULAR | Status: DC | PRN
Start: 2018-02-01 — End: 2018-02-01
  Administered 2018-02-01: 4 mg via INTRAVENOUS

## 2018-02-01 MED ORDER — PROMETHAZINE HCL 25 MG/ML IJ SOLN
6.2500 mg | INTRAMUSCULAR | Status: DC | PRN
Start: 2018-02-01 — End: 2018-02-01
  Administered 2018-02-01: 6.25 mg via INTRAVENOUS

## 2018-02-01 MED ORDER — SUGAMMADEX SODIUM 200 MG/2ML IV SOLN
INTRAVENOUS | Status: DC | PRN
  Administered 2018-02-01: 200 mg via INTRAVENOUS

## 2018-02-01 MED ORDER — FENTANYL CITRATE (PF) 250 MCG/5ML IJ SOLN
INTRAMUSCULAR | Status: AC
  Filled 2018-02-01: qty 5

## 2018-02-01 MED ORDER — FAMOTIDINE 20 MG PO TABS
20.0000 mg | ORAL_TABLET | Freq: Once | ORAL | Status: AC
  Administered 2018-02-01: 20 mg via ORAL
  Filled 2018-02-01: qty 1

## 2018-02-01 MED ORDER — ACETAMINOPHEN 160 MG/5ML PO SOLN: 960.0000 mg | Freq: Once | ORAL | Status: AC

## 2018-02-01 MED ORDER — KCL IN DEXTROSE-NACL 20-5-0.45 MEQ/L-%-% IV SOLN
INTRAVENOUS | Status: DC
  Administered 2018-02-01: 15:00:00 via INTRAVENOUS
  Filled 2018-02-01: qty 1000

## 2018-02-01 MED ORDER — EPINEPHRINE 0.3 MG/0.3ML IJ SOAJ: 0.3000 mg | Freq: Every day | INTRAMUSCULAR | Status: DC | PRN

## 2018-02-01 MED ORDER — DEXAMETHASONE SODIUM PHOSPHATE 10 MG/ML IJ SOLN
INTRAMUSCULAR | Status: AC
  Filled 2018-02-01: qty 1

## 2018-02-01 MED ORDER — PROMETHAZINE HCL 25 MG/ML IJ SOLN
INTRAMUSCULAR | Status: AC
  Filled 2018-02-01: qty 1

## 2018-02-01 MED ORDER — FENTANYL CITRATE (PF) 100 MCG/2ML IJ SOLN
INTRAMUSCULAR | Status: AC
  Filled 2018-02-01: qty 2

## 2018-02-01 MED ORDER — PROMETHAZINE HCL 25 MG/ML IJ SOLN
12.5000 mg | Freq: Four times a day (QID) | INTRAMUSCULAR | Status: DC | PRN
Start: 2018-02-01 — End: 2018-02-02
  Administered 2018-02-01: 12.5 mg via INTRAVENOUS
  Filled 2018-02-01: qty 1

## 2018-02-01 MED ORDER — LIDOCAINE 2% (20 MG/ML) 5 ML SYRINGE
INTRAMUSCULAR | Status: DC | PRN
  Administered 2018-02-01: 50 mg via INTRAVENOUS

## 2018-02-01 MED ORDER — ROCURONIUM BROMIDE 50 MG/5ML IV SOSY
PREFILLED_SYRINGE | INTRAVENOUS | Status: DC | PRN
  Administered 2018-02-01: 40 mg via INTRAVENOUS
  Administered 2018-02-01: 20 mg via INTRAVENOUS

## 2018-02-01 MED ORDER — HYDROCODONE-ACETAMINOPHEN 5-325 MG PO TABS
1.0000 | ORAL_TABLET | ORAL | Status: DC | PRN
  Administered 2018-02-01 – 2018-02-02 (×4): 1 via ORAL
  Filled 2018-02-01 (×4): qty 1

## 2018-02-01 MED ORDER — LACTATED RINGERS IV SOLN
INTRAVENOUS | Status: DC
  Administered 2018-02-01 (×2): via INTRAVENOUS

## 2018-02-01 MED ORDER — DIPHENHYDRAMINE HCL 25 MG PO CAPS: 25.0000 mg | ORAL_CAPSULE | Freq: Four times a day (QID) | ORAL | Status: DC | PRN

## 2018-02-01 MED ORDER — ACETAMINOPHEN 325 MG PO TABS: 650.0000 mg | ORAL_TABLET | Freq: Four times a day (QID) | ORAL | Status: DC | PRN

## 2018-02-01 MED ORDER — MIDAZOLAM HCL 2 MG/2ML IJ SOLN
INTRAMUSCULAR | Status: AC
  Filled 2018-02-01: qty 2

## 2018-02-01 SURGICAL SUPPLY — 34 items
ATTRACTOMAT 16X20 MAGNETIC DRP (DRAPES) ×2 IMPLANT
BLADE SURG 15 STRL LF DISP TIS (BLADE) ×1 IMPLANT
BLADE SURG 15 STRL SS (BLADE) ×1
CHLORAPREP W/TINT 26ML (MISCELLANEOUS) ×4 IMPLANT
CLIP VESOCCLUDE MED 6/CT (CLIP) ×4 IMPLANT
CLIP VESOCCLUDE SM WIDE 6/CT (CLIP) ×4 IMPLANT
DISSECTOR ROUND CHERRY 3/8 STR (MISCELLANEOUS) IMPLANT
DRAPE LAPAROTOMY T 98X78 PEDS (DRAPES) ×2 IMPLANT
ELECT PENCIL ROCKER SW 15FT (MISCELLANEOUS) ×2 IMPLANT
ELECT REM PT RETURN 15FT ADLT (MISCELLANEOUS) ×2 IMPLANT
GAUZE 4X4 16PLY RFD (DISPOSABLE) ×4 IMPLANT
GAUZE SPONGE 4X4 12PLY STRL (GAUZE/BANDAGES/DRESSINGS) ×2 IMPLANT
GLOVE SURG ORTHO 8.0 STRL STRW (GLOVE) ×2 IMPLANT
GOWN STRL REUS W/TWL XL LVL3 (GOWN DISPOSABLE) ×4 IMPLANT
HEMOSTAT SURGICEL 2X4 FIBR (HEMOSTASIS) ×2 IMPLANT
ILLUMINATOR WAVEGUIDE N/F (MISCELLANEOUS) ×2 IMPLANT
KIT BASIN OR (CUSTOM PROCEDURE TRAY) ×2 IMPLANT
LIGHT WAVEGUIDE WIDE FLAT (MISCELLANEOUS) IMPLANT
PACK BASIC VI WITH GOWN DISP (CUSTOM PROCEDURE TRAY) ×2 IMPLANT
POWDER SURGICEL 3.0 GRAM (HEMOSTASIS) IMPLANT
SHEARS HARMONIC 9CM CVD (BLADE) ×2 IMPLANT
STAPLER VISISTAT 35W (STAPLE) ×2 IMPLANT
STRIP CLOSURE SKIN 1/2X4 (GAUZE/BANDAGES/DRESSINGS) ×2 IMPLANT
SUT MNCRL AB 4-0 PS2 18 (SUTURE) ×2 IMPLANT
SUT SILK 2 0 (SUTURE) ×1
SUT SILK 2-0 18XBRD TIE 12 (SUTURE) ×1 IMPLANT
SUT SILK 3 0 (SUTURE)
SUT SILK 3-0 18XBRD TIE 12 (SUTURE) IMPLANT
SUT VIC AB 3-0 SH 18 (SUTURE) ×2 IMPLANT
SYR BULB IRRIGATION 50ML (SYRINGE) ×2 IMPLANT
TAPE CLOTH SURG 4X10 WHT LF (GAUZE/BANDAGES/DRESSINGS) ×2 IMPLANT
TOWEL OR 17X26 10 PK STRL BLUE (TOWEL DISPOSABLE) ×2 IMPLANT
TOWEL OR NON WOVEN STRL DISP B (DISPOSABLE) ×2 IMPLANT
YANKAUER SUCT BULB TIP 10FT TU (MISCELLANEOUS) ×2 IMPLANT

## 2018-02-01 NOTE — Anesthesia Preprocedure Evaluation (Addendum)
Anesthesia Evaluation  Patient identified by MRN, date of birth, ID band Patient awake    Reviewed: Allergy & Precautions, NPO status , Patient's Chart, lab work & pertinent test results  Airway Mallampati: II  TM Distance: >3 FB Neck ROM: Full    Dental no notable dental hx.    Pulmonary neg pulmonary ROS,    Pulmonary exam normal breath sounds clear to auscultation       Cardiovascular negative cardio ROS Normal cardiovascular exam Rhythm:Regular Rate:Normal     Neuro/Psych Anxiety negative neurological ROS     GI/Hepatic Neg liver ROS, GERD  Controlled,  Endo/Other  Morbid obesityIdiopathic urticaria  Renal/GU negative Renal ROS     Musculoskeletal Lower back pain   Abdominal (+) + obese,   Peds  Hematology negative hematology ROS (+)   Anesthesia Other Findings RIGHT THYROID NODULE  Reproductive/Obstetrics hcg negative                            Anesthesia Physical Anesthesia Plan  ASA: III  Anesthesia Plan: General   Post-op Pain Management:    Induction: Intravenous  PONV Risk Score and Plan: 3 and Midazolam, Dexamethasone, Ondansetron and Treatment may vary due to age or medical condition  Airway Management Planned: Oral ETT and Video Laryngoscope Planned  Additional Equipment:   Intra-op Plan:   Post-operative Plan: Extubation in OR and Possible Post-op intubation/ventilation  Informed Consent: I have reviewed the patients History and Physical, chart, labs and discussed the procedure including the risks, benefits and alternatives for the proposed anesthesia with the patient or authorized representative who has indicated his/her understanding and acceptance.   Dental advisory given  Plan Discussed with: CRNA and Surgeon  Anesthesia Plan Comments: (Case discussed with Dr. Conrad [place] and Dr. Harlow Asa)        Anesthesia Quick Evaluation

## 2018-02-01 NOTE — Progress Notes (Signed)
Dr. Roanna Banning in to see pt. Ok to discharge to floor, room 1534

## 2018-02-01 NOTE — Progress Notes (Signed)
Report given at bedside to [place].

## 2018-02-01 NOTE — Interval H&P Note (Signed)
History and Physical Interval Note:  02/01/2018 7:02 AM  Christy Alexander  has presented today for surgery, with the diagnosis of RIGHT THYROID NODULE.  The various methods of treatment have been discussed with the patient and family. After consideration of risks, benefits and other options for treatment, the patient has consented to    Procedure(s): RIGHT THYROID LOBECTOMY (Right) as a surgical intervention.    The patient's history has been reviewed, patient examined, no change in status, stable for surgery.  I have reviewed the patient's chart and labs.  Questions were answered to the patient's satisfaction.    Armandina Gemma, [place] [facility] Office: [place]

## 2018-02-01 NOTE — Op Note (Signed)
Procedure Note  Pre-operative Diagnosis:  Right thyroid nodule  Post-operative Diagnosis:  same  Surgeon:  Armandina Gemma, MD  Assistant:  none   Procedure:  Right thyroid lobectomy  Anesthesia:  General  Estimated Blood Loss:  minimal  Drains: none         Specimen: thyroid lobe to pathology  Indications:  Patient returns for scheduled follow-up. She has a known right sided dominant thyroid nodule which measures nearly 5 cm in dimension. Left thyroid lobe is relatively normal with only a small cyst noted on previous ultrasound examinations. She has not had a recent ultrasound study. Patient was last evaluated in September 2018. At that time she was having mild to moderate compressive symptoms. Over the past 6 months she has noted increased symptoms including dysphagia and she has developed some discomfort in the right neck. She notes positional discomfort in the right neck. At this time she would like to proceed with thyroid surgery.  Procedure Details: Procedure was done in OR #5 at the [facility].  The patient was brought to the operating room and placed in a supine position on the operating room table.  Following administration of general anesthesia, the patient was positioned and then prepped and draped in the usual aseptic fashion.  After ascertaining that an adequate level of anesthesia had been achieved, a Kocher incision was made with #15 blade.  Dissection was carried through subcutaneous tissues and platysma. Hemostasis was achieved with the electrocautery.  Skin flaps were elevated cephalad and caudad from the thyroid notch to the sternal notch.  A self-retaining retractor was placed for exposure.  Strap muscles were incised in the midline and dissection was begun on the right side.  Strap muscles were reflected laterally.  The right thyroid lobe was moderately enlarged with a dominant nodule in the mid and inferior portion of the lobe.  The lobe was gently mobilized  with blunt dissection.  Superior pole vessels were dissected out and divided individually between small and medium Ligaclips with the Harmonic scalpel.  The thyroid lobe was rolled anteriorly.  Branches of the inferior thyroid artery were divided between small Ligaclips with the Harmonic scalpel.  Inferior venous tributaries were divided between Ligaclips.  Both the superior and inferior parathyroid glands were identified and preserved on their vascular pedicles.  The recurrent laryngeal nerve was identified and preserved along its course.  The ligament of Gwenlyn Found was released with the electrocautery and the gland was mobilized onto the anterior trachea. Isthmus was mobilized across the midline.  There was a small pyramidal lobe present which was resected with the thyroid isthmus.  The thyroid parenchyma was transected at the junction of the isthmus and contralateral thyroid lobe with the Harmonic scalpel.  The thyroid lobe and isthmus were submitted to pathology for review.  The neck was irrigated with warm saline.  Fibrillar was placed throughout the operative field.  Strap muscles were reapproximated in the midline with interrupted 3-0 Vicryl sutures.  Platysma was closed with interrupted 3-0 Vicryl sutures.  Skin was closed with a running 4-0 Monocryl subcuticular suture.  Wound was washed and dried and steri-strips were applied.  Dry gauze dressing was placed.  The patient was awakened from anesthesia and brought to the recovery room.  The patient tolerated the procedure well.   Armandina Gemma, MD [facility] Office: [phone]

## 2018-02-01 NOTE — Anesthesia Procedure Notes (Signed)
Procedure Name: Intubation Date/Time: 02/01/2018 7:50 AM Performed by: Maxwell Caul, CRNA Pre-anesthesia Checklist: Patient identified, Emergency Drugs available, Suction available and Patient being monitored Patient Re-evaluated:Patient Re-evaluated prior to induction Oxygen Delivery Method: Circle system utilized Preoxygenation: Pre-oxygenation with 100% oxygen Induction Type: IV induction Ventilation: Mask ventilation without difficulty Laryngoscope Size: Glidescope Grade View: Grade I Tube type: Oral Tube size: 7.0 mm Airway Equipment and Method: Video-laryngoscopy and Stylet Placement Confirmation: ETT inserted through vocal cords under direct vision,  positive ETCO2,  CO2 detector and breath sounds checked- equal and bilateral Secured at: 22 cm Tube secured with: Tape Dental Injury: Bloody posterior oropharynx

## 2018-02-01 NOTE — Transfer of Care (Signed)
Immediate Anesthesia Transfer of Care Note  Patient: Christy Alexander  Procedure(s) Performed: RIGHT THYROID LOBECTOMY (Right Neck)  Patient Location: PACU  Anesthesia Type:General  Level of Consciousness: awake, alert  and oriented  Airway & Oxygen Therapy: Patient Spontanous Breathing and Patient connected to face mask oxygen  Post-op Assessment: Report given to RN and Post -op Vital signs reviewed and stable  Post vital signs: Reviewed and stable  Last Vitals:  Vitals Value Taken Time  BP    Temp    Pulse    Resp    SpO2      Last Pain:  Vitals:   02/01/18 0546  TempSrc: Oral      Patients Stated Pain Goal: 4 (27/07/86 7544)  Complications: No apparent anesthesia complications

## 2018-02-01 NOTE — Anesthesia Postprocedure Evaluation (Signed)
Anesthesia Post Note  Patient: Christy Alexander  Procedure(s) Performed: RIGHT THYROID LOBECTOMY (Right Neck)     Patient location during evaluation: PACU Anesthesia Type: General Level of consciousness: awake and alert Pain management: pain level controlled Vital Signs Assessment: post-procedure vital signs reviewed and stable Respiratory status: spontaneous breathing, nonlabored ventilation, respiratory function stable and patient connected to nasal cannula oxygen Cardiovascular status: blood pressure returned to baseline and stable Postop Assessment: no apparent nausea or vomiting Anesthetic complications: no    Last Vitals:  Vitals:   02/01/18 1358 02/01/18 1442  BP: (!) 142/88 129/86  Pulse: 88 85  Resp: 18 17  Temp: 36.6 C 36.7 C  SpO2: 99% 97%    Last Pain:  Vitals:   02/01/18 1442  TempSrc: Oral  PainSc: 2                  Ryan P Ellender

## 2018-02-02 ENCOUNTER — Encounter (HOSPITAL_COMMUNITY): Payer: Self-pay | Admitting: Surgery

## 2018-02-02 DIAGNOSIS — Z888 Allergy status to other drugs, medicaments and biological substances status: Secondary | ICD-10-CM | POA: Diagnosis not present

## 2018-02-02 DIAGNOSIS — M1990 Unspecified osteoarthritis, unspecified site: Secondary | ICD-10-CM | POA: Diagnosis not present

## 2018-02-02 DIAGNOSIS — L501 Idiopathic urticaria: Secondary | ICD-10-CM | POA: Diagnosis not present

## 2018-02-02 DIAGNOSIS — Z91041 Radiographic dye allergy status: Secondary | ICD-10-CM | POA: Diagnosis not present

## 2018-02-02 DIAGNOSIS — Z886 Allergy status to analgesic agent status: Secondary | ICD-10-CM | POA: Diagnosis not present

## 2018-02-02 DIAGNOSIS — F419 Anxiety disorder, unspecified: Secondary | ICD-10-CM | POA: Diagnosis not present

## 2018-02-02 DIAGNOSIS — E041 Nontoxic single thyroid nodule: Secondary | ICD-10-CM | POA: Diagnosis not present

## 2018-02-02 DIAGNOSIS — K219 Gastro-esophageal reflux disease without esophagitis: Secondary | ICD-10-CM | POA: Diagnosis not present

## 2018-02-02 LAB — BASIC METABOLIC PANEL
Anion gap: 9 (ref 5–15)
BUN: 9 mg/dL (ref 6–20)
CO2: 24 mmol/L (ref 22–32)
Calcium: 8.8 mg/dL — ABNORMAL LOW (ref 8.9–10.3)
Chloride: 104 mmol/L (ref 101–111)
Creatinine, Ser: 0.57 mg/dL (ref 0.44–1.00)
GFR calc Af Amer: >60 mL/min (ref >60)
GFR calc non Af Amer: >60 mL/min (ref >60)
Glucose, Bld: 113 mg/dL — ABNORMAL HIGH (ref 65–99)
Potassium: 4.4 mmol/L (ref 3.5–5.1)
Sodium: 137 mmol/L (ref 135–145)

## 2018-02-02 MED ORDER — TRAMADOL HCL 50 MG PO TABS: 50.0000 mg | ORAL_TABLET | Freq: Four times a day (QID) | ORAL | 0 refills | Status: DC | PRN

## 2018-02-02 MED ORDER — MUPIROCIN 2 % EX OINT
TOPICAL_OINTMENT | CUTANEOUS | Status: AC
  Filled 2018-02-02: qty 22

## 2018-02-02 NOTE — Progress Notes (Signed)
Discharge instructions discussed with patient and family, verbalized agreement and understanding, prescription given to patient 

## 2018-02-02 NOTE — Discharge Summary (Signed)
Physician Discharge Summary [facility]  Patient ID: Christy Alexander MRN: [id number] DOB/AGE: 1981/06/22 37 y.o.  Admit date: 02/01/2018 Discharge date: 02/02/2018  Admission Diagnoses:  Right thyroid nodule  Discharge Diagnoses:  Principal Problem:   Right thyroid nodule   Discharged Condition: good  Hospital Course: Patient was admitted for observation following thyroid surgery.  Post op course was uncomplicated.  Pain was well controlled.  Tolerated diet.  Post op calcium level on morning following surgery was 8.8 mg/dl.  Patient was prepared for discharge home on POD#1.  Consults: None  Treatments: surgery: right thyroid lobectomy  Discharge Exam: Blood pressure (!) 96/57, pulse (!) 58, temperature 98.5 F (36.9 C), temperature source Oral, resp. rate 18, height 5\' 4"  (1.626 m), weight 106.6 kg (235 lb), last menstrual period 01/30/2018, SpO2 97 %. HEENT - clear Neck - wound dry and intact; mild STS; voice normal Chest - clear bilaterally Cor - RRR  Disposition: Home  Discharge Instructions    Diet - low sodium heart healthy   Complete by:  As directed    Discharge instructions   Complete by:  As directed    [facility]  THYROID & PARATHYROID SURGERY:  POST-OP INSTRUCTIONS  Always review your discharge instruction sheet from the facility where your surgery was performed.  A prescription for pain medication may be given to you upon discharge.  Take your pain medication as prescribed.  If narcotic pain medicine is not needed, then you may take acetaminophen (Tylenol) or ibuprofen (Advil) as needed.  Take your usually prescribed medications unless otherwise directed.  If you need a refill on your pain medication, please contact our office during regular business hours.  Prescriptions cannot be processed by our office after 5 pm or on weekends.  Start with a light diet upon arrival home, such as soup and crackers or toast.  Be  sure to drink plenty of fluids daily.  Resume your normal diet the day after surgery.  Most patients will experience some swelling and bruising on the chest and neck area.  Ice packs will help.  Swelling and bruising can take several days to resolve.   It is common to experience some constipation after surgery.  Increasing fluid intake and taking a stool softener (Colace) will usually help or prevent this problem.  A mild laxative (Milk of Magnesia or Miralax) should be taken according to package directions if there has been no bowel movement after 48 hours.  You have steri-strips and a gauze dressing over your incision.  You may remove the gauze bandage on the second day after surgery, and you may shower at that time.  Leave your steri-strips (small skin tapes) in place directly over the incision.  These strips should remain on the skin for 5-7 days and then be removed.  You may get them wet in the shower and pat them dry.  You may resume regular (light) daily activities beginning the next day (such as daily self-care, walking, climbing stairs) gradually increasing activities as tolerated.  You may have sexual intercourse when it is comfortable.  Refrain from any heavy lifting or straining until approved by your doctor.  You may drive when you no longer are taking prescription pain medication, you can comfortably wear a seatbelt, and you can safely maneuver your car and apply brakes.  You should see your doctor in the office for a follow-up appointment approximately three weeks after your surgery.  Make sure that you call  for this appointment within a day or two after you arrive home to insure a convenient appointment time.  WHEN TO CALL YOUR DOCTOR: -- Fever greater than 101.5 -- Inability to urinate -- Nausea and/or vomiting - persistent -- Extreme swelling or bruising -- Continued bleeding from incision -- Increased pain, redness, or drainage from the incision -- Difficulty swallowing or  breathing -- Muscle cramping or spasms -- Numbness or tingling in hands or around lips  The clinic staff is available to answer your questions during regular business hours.  Please don't hesitate to call and ask to speak to one of the nurses if you have concerns.  Armandina Gemma, MD [facility] Office: [phone]   Increase activity slowly   Complete by:  As directed    Remove dressing in 24 hours   Complete by:  As directed      Allergies as of 02/02/2018      Reactions   Aspirin Swelling   hoarseness   Chlorhexidine Dermatitis   Contrast Media [iodinated Diagnostic Agents] Hives, Itching   Nsaids Other (See Comments)   Eyes swelling/loss of voice      Medication List    TAKE these medications   acetaminophen 500 MG tablet Commonly known as:  TYLENOL Take 1,000 mg by mouth every 6 (six) hours as needed for headache.   amoxicillin-clavulanate 875-125 MG tablet Commonly known as:  AUGMENTIN Take 1 tablet by mouth 2 (two) times daily.   cetirizine 10 MG tablet Commonly known as:  ZYRTEC Take 10 mg by mouth at bedtime.   diphenhydrAMINE 2 % cream Commonly known as:  BENADRYL Apply 1 application topically 3 (three) times daily as needed (for hives.).   diphenhydrAMINE 25 mg capsule Commonly known as:  BENADRYL Take 25-50 mg by mouth every 6 (six) hours as needed (for hives.).   Docusate Sodium 100 MG capsule Take 200 mg by mouth at bedtime.   EPINEPHrine 0.3 mg/0.3 mL Soaj injection Commonly known as:  EPIPEN 2-PAK Inject 0.3 mLs (0.3 mg total) into the muscle once. What changed:    when to take this  reasons to take this   meloxicam 15 MG tablet Commonly known as:  MOBIC Take 15 mg by mouth daily as needed (for back/joint pain).   traMADol 50 MG tablet Commonly known as:  ULTRAM Take 1-2 tablets (50-100 mg total) by mouth every 6 (six) hours as needed for moderate pain (mild pain not relieved by APAP).   vitamin C 1000 MG tablet Take  1,000 mg by mouth at bedtime.      Follow-up Information    Armandina Gemma, MD. Schedule an appointment as soon as possible for a visit in 3 week(s).   Specialty:  General Surgery Contact information: [address] [place] [address] [phone]           Earnstine Regal, MD, [facility] Office: [phone]   Signed: Earnstine Regal 02/02/2018, 8:16 AM

## 2018-02-06 NOTE — Progress Notes (Signed)
Please contact patient and notify of benign pathology results.  Lexington Krotz M. Devell Parkerson, MD, FACS [facility] Office: [phone]   

## 2018-02-13 DIAGNOSIS — E042 Nontoxic multinodular goiter: Secondary | ICD-10-CM | POA: Diagnosis not present

## 2018-02-13 DIAGNOSIS — E041 Nontoxic single thyroid nodule: Secondary | ICD-10-CM | POA: Diagnosis not present

## 2018-03-01 DIAGNOSIS — E042 Nontoxic multinodular goiter: Secondary | ICD-10-CM | POA: Diagnosis not present

## 2018-03-01 DIAGNOSIS — E041 Nontoxic single thyroid nodule: Secondary | ICD-10-CM | POA: Diagnosis not present

## 2018-06-11 DIAGNOSIS — L304 Erythema intertrigo: Secondary | ICD-10-CM | POA: Diagnosis not present

## 2018-06-11 DIAGNOSIS — Z01419 Encounter for gynecological examination (general) (routine) without abnormal findings: Secondary | ICD-10-CM | POA: Diagnosis not present

## 2018-06-11 MED FILL — LARIN FE 1.5-30 TABLET: 1.5-30 | 84 days supply | Qty: 84 | Fill #0

## 2018-06-25 DIAGNOSIS — K219 Gastro-esophageal reflux disease without esophagitis: Secondary | ICD-10-CM | POA: Diagnosis not present

## 2018-06-25 DIAGNOSIS — E785 Hyperlipidemia, unspecified: Secondary | ICD-10-CM | POA: Diagnosis not present

## 2018-06-25 DIAGNOSIS — K5909 Other constipation: Secondary | ICD-10-CM | POA: Diagnosis not present

## 2018-06-25 DIAGNOSIS — Z6841 Body Mass Index (BMI) 40.0 and over, adult: Secondary | ICD-10-CM | POA: Diagnosis not present

## 2018-06-25 DIAGNOSIS — Z79899 Other long term (current) drug therapy: Secondary | ICD-10-CM | POA: Diagnosis not present

## 2018-06-25 DIAGNOSIS — J302 Other seasonal allergic rhinitis: Secondary | ICD-10-CM | POA: Diagnosis not present

## 2018-06-25 DIAGNOSIS — E041 Nontoxic single thyroid nodule: Secondary | ICD-10-CM | POA: Diagnosis not present

## 2018-06-25 DIAGNOSIS — Z0000 Encounter for general adult medical examination without abnormal findings: Secondary | ICD-10-CM | POA: Diagnosis not present

## 2018-06-26 DIAGNOSIS — E559 Vitamin D deficiency, unspecified: Secondary | ICD-10-CM | POA: Insufficient documentation

## 2018-06-26 DIAGNOSIS — R7309 Other abnormal glucose: Secondary | ICD-10-CM | POA: Insufficient documentation

## 2018-06-29 DIAGNOSIS — Z0100 Encounter for examination of eyes and vision without abnormal findings: Secondary | ICD-10-CM | POA: Diagnosis not present

## 2018-06-29 DIAGNOSIS — H5213 Myopia, bilateral: Secondary | ICD-10-CM | POA: Diagnosis not present

## 2018-06-29 DIAGNOSIS — H52223 Regular astigmatism, bilateral: Secondary | ICD-10-CM | POA: Diagnosis not present

## 2018-07-03 DIAGNOSIS — J343 Hypertrophy of nasal turbinates: Secondary | ICD-10-CM | POA: Diagnosis not present

## 2018-07-03 DIAGNOSIS — R490 Dysphonia: Secondary | ICD-10-CM

## 2018-07-03 DIAGNOSIS — Z9009 Acquired absence of other part of head and neck: Secondary | ICD-10-CM | POA: Diagnosis not present

## 2018-07-03 DIAGNOSIS — E890 Postprocedural hypothyroidism: Secondary | ICD-10-CM | POA: Insufficient documentation

## 2018-07-03 DIAGNOSIS — J384 Edema of larynx: Secondary | ICD-10-CM | POA: Diagnosis not present

## 2018-07-03 HISTORY — DX: Dysphonia: R49.0

## 2018-07-13 DIAGNOSIS — H532 Diplopia: Secondary | ICD-10-CM | POA: Diagnosis not present

## 2018-07-13 DIAGNOSIS — H04123 Dry eye syndrome of bilateral lacrimal glands: Secondary | ICD-10-CM | POA: Diagnosis not present

## 2018-07-19 ENCOUNTER — Encounter: Payer: 59 | Attending: Internal Medicine | Admitting: Dietician

## 2018-07-19 ENCOUNTER — Encounter: Payer: Self-pay | Admitting: Dietician

## 2018-07-19 DIAGNOSIS — E669 Obesity, unspecified: Secondary | ICD-10-CM

## 2018-07-19 DIAGNOSIS — Z713 Dietary counseling and surveillance: Secondary | ICD-10-CM | POA: Diagnosis not present

## 2018-07-19 NOTE — Progress Notes (Signed)
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 Pt states she feels that she eats pretty healthy but is concerned with her portion sizes. Pt states she does not like salty foods so it is easy for her to not eat it. Pt states that she loves sweets, but does not eat them in excessive amounts (a little something every day.) Pt states she understands her "trigger foods" and does not do well with restriction, because it often leads to overeating later.  Estimated Daily Fluid Intake: 16 oz GI / Other Notable Symptoms: constipation  Physical Activity  Current average weekly physical activity: none currently, but just got a new exercise bike assembled today. Pt states she and her husband used to walk daily during which time pt lost ~50 lbs.   Encouraged to engage in 150 minutes of moderate physical activity including  cardiovascular and weight baring weekly.   Estimated Energy Needs Calories: 1800 Carbohydrate: 200g Protein: 113g Fat: 60g   NUTRITION DIAGNOSIS  Overweight/obesity ([place]-3.3) related to excessive energy intake and physical inactivity as evidenced by BMI more than normative standard for age and sex (BMI 40.8), estimated excessive energy intake, infrequent physical activity, thyroid disease, and hx of familial obesity.    NUTRITION INTERVENTION  Nutrition education (E-1) and diet modification including general healthful diet (ND-1.1) and energy modified diet (ND-1.2.2.) Content related nutrition education included the following topics:   General healthful diet: review of MyPlate, food groups, healthful food choices  Fiber: importance (increase satiety, promote digestion, relieve constipation, etc,) food sources  Fluid: goal of 64+ fl oz/day, tips to increase intake (refillable water bottles, set reminders, etc.)   Energy/calories: calculated REE, discussed macronutrient distribution (amounts of energy coming from carbohydrates, protein, and fat,) deficit needed for weight loss (-500 kcals/day to lose 1 lb/week)   Thyroid: nutrients that promote and inhibit thyroid health, role in metabolism   Physical activity: recommend 150+ mins/week of physical activity  Prediabetes: brief discussion of Hgb A1c and lifestyle strategies to control blood sugar (healthy diet, exercise, and weight loss of ~5-7% of body weight)   Handouts Provided Include   MyPlate  Types of Fat  Types of Sugar   Thyroid Health   Weight Management Resources  Learning Style & Readiness for Change Teaching method utilized: Visual & Auditory  Demonstrated degree of understanding via: Teach Back  Barriers to learning/adherence to lifestyle change: none identified   Pt asked many well-informed  questions and is already very knowlegable about nutrition. Pt states she has identified her weaknesses (regarding dietary  choices/habits and physical activity) and is willing to make changes. Today pt made goals to increase fluid intake, work on monitoring added sugar intake, and incorporating physical activity throughout the week using new stationary bike.    MONITORING & EVALUATION Dietary intake, fluid intake, weekly physical activity, and weight.   Next Steps  Patient is to contact [facility] for follow up appointment as needed/desired by pt or recommended by MD.

## 2018-07-19 NOTE — Patient Instructions (Addendum)
   Work on limiting intake of sweets (added sugars) with goal of <25g of added sugars/day    Incorporate physical activity, getting in 150+ minutes / week  Ride new stationary bike  Aim to get 64+ fl oz of fluid per day (ideally at least 1/2 coming from plain water)

## 2018-07-30 NOTE — Progress Notes (Signed)
Name: Christy Alexander  MRN/ DOB: [id number], Jan 28, 1981    Age/ Sex: 37 y.o., female    PCP: Pomposini, Cherly Anderson, MD   Reason for Endocrinology Evaluation: Thyroid nodule      Date of Initial Endocrinology Evaluation: 07/31/2018     HPI: Christy Alexander is a 37 y.o. female with a past medical history of idiopathic urticaria. The patient presented for initial endocrinology clinic visit on 07/31/2018 for consultative assistance with her Thyroid nodule .   Pt was noted to have a right thyroid nodule many years ago. She is S/P right thyroid nodule FNA in 2017 but due to compression symptoms, patient underwent a right thyroid lobectomy in 01/2018 with benign pathology.   Today she is c/o weight gain, dry skin , dry hair and arthralgias which she attributes her symptoms to TSH trending upwards since her lobectomy.   Her TSH prior to the surgery used to be in 1-2 uIU/mL range but after her surgery it is now in the 4's. uIU/mL  The patient attributed her symptoms to hypothyroidism.   We discussed her normal TSH but she is convinced that since her TSH was much lower prior to the lobectomy and now since the lobectomy her TSH is in the 4's that she may need LT-4 replacement.    She denies any local neck symptoms at this time  No FH of thyroid disease but has strong FH of DM   HISTORY:  Past Medical History:  Past Medical History:  Diagnosis Date  . Anxiety   . Back pain at L4-L5 level   . Delayed pressure urticaria dx age 45   trauma to skin causes deep tissue  swelling  . GERD (gastroesophageal reflux disease)   . Lower back pain    l4 to l5  . Right thyroid nodule   . TMJ (dislocation of temporomandibular joint)    Past Surgical History:  Past Surgical History:  Procedure Laterality Date  . LUMBAR LAMINECTOMY  2006   L4 and L5 discectomy micro, hemilaminectomy, had conscious sedation for   . THYROID LOBECTOMY Right 02/01/2018   Procedure: RIGHT THYROID LOBECTOMY;  Surgeon:  Armandina Gemma, MD;  Location: [facility];  Service: General;  Laterality: Right;  . WISDOM TOOTH EXTRACTION  2001      Social History:  reports that she has never smoked. She has never used smokeless tobacco. She reports that she does not drink alcohol or use drugs.  Family History: family history includes Diabetes type II in her brother and mother.   HOME MEDICATIONS: Current Outpatient Medications on File Prior to Visit  Medication Sig Dispense Refill  . acetaminophen (TYLENOL) 500 MG tablet Take 1,000 mg by mouth every 6 (six) hours as needed for headache.     . Ascorbic Acid (VITAMIN C) 1000 MG tablet Take 1,000 mg by mouth at bedtime.    . cetirizine (ZYRTEC) 10 MG tablet Take 10 mg by mouth at bedtime.     . diphenhydrAMINE (BENADRYL) 2 % cream Apply 1 application topically 3 (three) times daily as needed (for hives.).    Marland Kitchen diphenhydrAMINE (BENADRYL) 25 mg capsule Take 25-50 mg by mouth every 6 (six) hours as needed (for hives.).     Marland Kitchen Docusate Sodium 100 MG capsule Take 200 mg by mouth at bedtime.     Marland Kitchen EPINEPHrine (EPIPEN 2-PAK) 0.3 mg/0.3 mL IJ SOAJ injection Inject 0.3 mLs (0.3 mg total) into the muscle once. (Patient taking differently: Inject 0.3 mg into the  muscle daily as needed (for anaphylatic reaction.). ) 2 Device 1  . meloxicam (MOBIC) 15 MG tablet Take 15 mg by mouth daily as needed (for back/joint pain).    Marland Kitchen amoxicillin-clavulanate (AUGMENTIN) 875-125 MG tablet Take 1 tablet by mouth 2 (two) times daily. (Patient not taking: Reported on 01/23/2018) 14 tablet 0  . traMADol (ULTRAM) 50 MG tablet Take 1-2 tablets (50-100 mg total) by mouth every 6 (six) hours as needed for moderate pain (mild pain not relieved by APAP). (Patient not taking: Reported on 07/31/2018) 20 tablet 0   No current facility-administered medications on file prior to visit.       REVIEW OF SYSTEMS: A comprehensive ROS was conducted with the patient and is negative except as per HPI and below:  Review of  Systems  Constitutional: Positive for malaise/fatigue and weight loss.  HENT: Negative for congestion and sore throat.   Eyes: Negative for blurred vision and pain.  Respiratory: Negative for cough and shortness of breath.   Cardiovascular: Negative for chest pain and palpitations.  Gastrointestinal: Positive for constipation. Negative for nausea.  Genitourinary: Negative for frequency.  Musculoskeletal: Positive for joint pain.  Skin:       Dry skin   Neurological: Negative for tingling and tremors.  Endo/Heme/Allergies: Positive for polydipsia.  Psychiatric/Behavioral: Negative for depression. The patient is nervous/anxious.        OBJECTIVE:  VS: BP 138/82 (BP Location: Right Arm, Patient Position: Sitting, Cuff Size: Normal)   Pulse 95   Ht 5\' 4"  (1.626 m)   Wt 240 lb 3.2 oz (109 kg)   SpO2 98%   BMI 41.23 kg/m    Wt Readings from Last 3 Encounters:  07/31/18 240 lb 3.2 oz (109 kg)  07/19/18 237 lb 8 oz (107.7 kg)  02/01/18 235 lb (106.6 kg)     EXAM: General: Pt appears well and is in NAD  Hydration: Well-hydrated with moist mucous membranes and good skin turgor  Eyes: External eye exam normal without stare, lid lag or exophthalmos.  EOM intact.    Ears, Nose, Throat: Hearing: Grossly intact bilaterally Dental: Good dentition  Throat: Clear without mass, erythema or exudate  Neck: General: Supple without adenopathy. Thyroid: No goiter or nodules appreciated. No thyroid bruit. Lower neck scar reddish.   Lungs: Clear with good BS bilat with no rales, rhonchi, or wheezes  Heart: Auscultation: RRR.  Abdomen: Normoactive bowel sounds, soft, nontender, without masses or organomegaly palpable  Extremities: BL LE: No pretibial edema normal ROM and strength.  Skin: Hair: Texture and amount normal with gender appropriate distribution Skin Inspection: No rashes Skin Palpation: Skin temperature, texture, and thickness normal to palpation  Neuro: Cranial nerves: II - XII  grossly intact  Motor: Normal strength throughout DTRs: 2+ and symmetric in UE without delay in relaxation phase  Mental Status: Judgment, insight: Intact Orientation: Oriented to time, place, and person Mood and affect: No depression, anxiety, or agitation     DATA REVIEWED: 06/26/18  TSH 4.14 uIU/mL TT4 7.6 mcg/dL     TSH (05/08/17) 1.260 uIU/mL  Vitamin D 21 ng/mL A1c 5.9%  Thyroid Ultrasound (01/08/2018) Parenchymal Echotexture: Moderately heterogenous  Isthmus: 0.2 cm  Right lobe: 6.3 x 3.1 x 3.8 cm  Left lobe: 5.8 x 1.4 x 1.6 cm  _________________________________________________________  Estimated total number of nodules >/= 1 cm: 1  Number of spongiform nodules >/=  2 cm not described below (TR1): 0  Number of mixed cystic and solid nodules >/= 1.5  cm not described below ([place]): 0  _________________________________________________________  Similar appearance of the previously biopsied right-sided mixed solid and cystic thyroid nodule. The mass measures approximately 5.3 x 3.6 x 2.7 cm compared to 4.7 x 3.2 x 2.4 cm previously no new thyroid nodules are identified. The mass demonstrates similar imaging characteristics. No new or suspicious features.  IMPRESSION: 1. Perhaps slight interval enlargement of the mixed cystic and solid nodule occupying the majority of the right gland. This nodule was previously biopsied on 12/03/2015. Recommend correlation with prior biopsy results. 2. No new thyroid nodules.   Results for ANIZA, SHOR (MRN [id number]) as of 08/01/2018 12:32  Ref. Range 07/31/2018 13:58  TSH Latest Ref Range: 0.35 - 4.50 uIU/mL 2.95  T4,Free(Direct) Latest Ref Range: 0.60 - 1.60 ng/dL 0.69     ASSESSMENT/PLAN/RECOMMENDATIONS:   1. Hx of right thyroid nodule, S/P Right thyroid lobectomy :   - Patient with multiple non-specific symptoms. She is biochemically euthyroid - No local neck symptoms.  - I have explained to her that to  determine the need for LT-4 replacement, her TSH has to be > 10 uI/mL or have a low FT4. 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   2. Vitamin D insufficiency:  - Continue Vitamin D 1000 units daily, recheck by PCP in 3 months.   3. Pre-Diabetes :   - Praised patient on recent lifestyle changes, as she just started intermittent fasting and exercise.  - Encouraged aerobic exercise 150-175 min/week - Discussed further options for low CHO diet and avoid snacks - She already avoids sugar-sweetened beverages.    Addendum: TSh and FT4 is normal . I do not recommend any LT-4 replacement at this time unless TSH is above 10 uIU/mL of FT4 is below normal. Attempted to call the patient on 11/13 , left a message for a call back to discuss the above.  :mychart" message will be sent    F/u PRN   Signed electronically by: Mack Guise, MD  [facility]  [facility] [address] [place], [place] [address] Phone: [phone] FAX: [phone]   CC: Pomposini, Cherly Anderson, MD No address on file Phone: None Fax: [phone]   Return to Endocrinology clinic as below: No future appointments.

## 2018-07-31 ENCOUNTER — Ambulatory Visit: Payer: 59 | Admitting: Internal Medicine

## 2018-07-31 ENCOUNTER — Encounter: Payer: Self-pay | Admitting: Internal Medicine

## 2018-07-31 VITALS — BP 138/82 | HR 95 | Ht 64.0 in | Wt 240.2 lb

## 2018-07-31 DIAGNOSIS — R5383 Other fatigue: Secondary | ICD-10-CM

## 2018-07-31 DIAGNOSIS — E559 Vitamin D deficiency, unspecified: Secondary | ICD-10-CM | POA: Diagnosis not present

## 2018-07-31 DIAGNOSIS — M2550 Pain in unspecified joint: Secondary | ICD-10-CM

## 2018-07-31 DIAGNOSIS — R7303 Prediabetes: Secondary | ICD-10-CM

## 2018-07-31 DIAGNOSIS — Z9889 Other specified postprocedural states: Secondary | ICD-10-CM | POA: Diagnosis not present

## 2018-07-31 DIAGNOSIS — E041 Nontoxic single thyroid nodule: Secondary | ICD-10-CM | POA: Diagnosis not present

## 2018-07-31 LAB — T4, FREE: Free T4: 0.69 ng/dL (ref 0.60–1.60)

## 2018-07-31 LAB — TSH: TSH: 2.95 u[IU]/mL (ref 0.35–4.50)

## 2018-07-31 NOTE — Patient Instructions (Addendum)
-   Please stop by the lab today - We will contact you with your results.

## 2018-08-01 ENCOUNTER — Encounter: Payer: Self-pay | Admitting: Internal Medicine

## 2018-08-01 ENCOUNTER — Telehealth: Payer: Self-pay | Admitting: Internal Medicine

## 2018-08-01 LAB — THYROID PEROXIDASE ANTIBODIES (TPO) (REFL): Thyroperoxidase Ab SerPl-aCnc: 2 IU/mL (ref <9)

## 2018-08-01 NOTE — Telephone Encounter (Signed)
Pt stated understanding but wanted to know if she still needed to go to [facility] for the antibodies lab work, I informed pt that you would reach out to her on her mychart

## 2018-08-01 NOTE — Telephone Encounter (Signed)
Patient stated she received a phone call from Dr Kelton Pillar.

## 2018-08-01 NOTE — Telephone Encounter (Signed)
Please advise, do not see any previous phone note

## 2018-09-03 DIAGNOSIS — K219 Gastro-esophageal reflux disease without esophagitis: Secondary | ICD-10-CM | POA: Diagnosis not present

## 2018-09-03 DIAGNOSIS — R490 Dysphonia: Secondary | ICD-10-CM | POA: Diagnosis not present

## 2018-09-03 DIAGNOSIS — Z9009 Acquired absence of other part of head and neck: Secondary | ICD-10-CM | POA: Diagnosis not present

## 2018-09-13 MED FILL — OMEPRAZOLE 40 MG CPDR: 40 | 30 days supply | Qty: 60 | Fill #0

## 2018-09-26 MED FILL — OMEPRAZOLE 40 MG CPDR: 40 | 30 days supply | Qty: 60 | Fill #0

## 2018-10-02 ENCOUNTER — Telehealth: Payer: Self-pay | Admitting: Internal Medicine

## 2018-10-02 NOTE — Telephone Encounter (Signed)
Called pt and left detailed voicemail explaining that the labs that she had drawn in the office were sent to [facility] to be tested and that is why she received a letter from them.

## 2018-10-02 NOTE — Telephone Encounter (Signed)
Please call patient at ph# [phone]. Patient is confused due to getting lab results that she never had done at [facility] (for antibodies).

## 2018-10-24 DIAGNOSIS — Z79899 Other long term (current) drug therapy: Secondary | ICD-10-CM | POA: Diagnosis not present

## 2018-10-24 DIAGNOSIS — E041 Nontoxic single thyroid nodule: Secondary | ICD-10-CM | POA: Diagnosis not present

## 2018-10-24 DIAGNOSIS — Z6841 Body Mass Index (BMI) 40.0 and over, adult: Secondary | ICD-10-CM | POA: Diagnosis not present

## 2018-11-01 DIAGNOSIS — J385 Laryngeal spasm: Secondary | ICD-10-CM | POA: Diagnosis not present

## 2018-11-01 DIAGNOSIS — K219 Gastro-esophageal reflux disease without esophagitis: Secondary | ICD-10-CM | POA: Diagnosis not present

## 2018-11-01 DIAGNOSIS — R490 Dysphonia: Secondary | ICD-10-CM | POA: Diagnosis not present

## 2018-11-20 DIAGNOSIS — N9089 Other specified noninflammatory disorders of vulva and perineum: Secondary | ICD-10-CM | POA: Diagnosis not present

## 2018-11-28 DIAGNOSIS — G4730 Sleep apnea, unspecified: Secondary | ICD-10-CM | POA: Diagnosis not present

## 2018-12-03 DIAGNOSIS — K219 Gastro-esophageal reflux disease without esophagitis: Secondary | ICD-10-CM | POA: Diagnosis not present

## 2018-12-03 DIAGNOSIS — R490 Dysphonia: Secondary | ICD-10-CM | POA: Diagnosis not present

## 2018-12-03 MED FILL — DEXILANT DR 60 MG CAPSULE: 60 | 30 days supply | Qty: 30 | Fill #0

## 2018-12-13 DIAGNOSIS — R309 Painful micturition, unspecified: Secondary | ICD-10-CM | POA: Diagnosis not present

## 2019-01-04 MED FILL — OMEPRAZOLE 40 MG CPDR: 40 | 30 days supply | Qty: 60 | Fill #1

## 2019-03-17 MED FILL — OMEPRAZOLE 40 MG CPDR: 40 | 30 days supply | Qty: 60 | Fill #2

## 2019-03-21 DIAGNOSIS — Z6841 Body Mass Index (BMI) 40.0 and over, adult: Secondary | ICD-10-CM | POA: Diagnosis not present

## 2019-03-21 DIAGNOSIS — E782 Mixed hyperlipidemia: Secondary | ICD-10-CM | POA: Diagnosis not present

## 2019-03-21 DIAGNOSIS — R7309 Other abnormal glucose: Secondary | ICD-10-CM | POA: Diagnosis not present

## 2019-03-21 DIAGNOSIS — E559 Vitamin D deficiency, unspecified: Secondary | ICD-10-CM | POA: Diagnosis not present

## 2019-03-21 DIAGNOSIS — Z79899 Other long term (current) drug therapy: Secondary | ICD-10-CM | POA: Diagnosis not present

## 2019-03-22 DIAGNOSIS — R739 Hyperglycemia, unspecified: Secondary | ICD-10-CM | POA: Insufficient documentation

## 2019-04-10 MED FILL — OMEPRAZOLE 40 MG CPDR: 40 | 30 days supply | Qty: 60 | Fill #3

## 2019-04-18 DIAGNOSIS — K219 Gastro-esophageal reflux disease without esophagitis: Secondary | ICD-10-CM | POA: Diagnosis not present

## 2019-04-18 DIAGNOSIS — R490 Dysphonia: Secondary | ICD-10-CM | POA: Diagnosis not present

## 2019-05-10 MED FILL — OMEPRAZOLE 40 MG CPDR: 40 | 30 days supply | Qty: 60 | Fill #4

## 2019-06-04 ENCOUNTER — Other Ambulatory Visit: Payer: Self-pay

## 2019-06-04 ENCOUNTER — Ambulatory Visit: Payer: 59 | Admitting: Neurology

## 2019-06-04 ENCOUNTER — Encounter: Payer: Self-pay | Admitting: Neurology

## 2019-06-04 VITALS — BP 124/79 | HR 85 | Temp 97.1°F | Ht 64.0 in | Wt 240.8 lb

## 2019-06-04 DIAGNOSIS — H532 Diplopia: Secondary | ICD-10-CM | POA: Diagnosis not present

## 2019-06-04 DIAGNOSIS — R531 Weakness: Secondary | ICD-10-CM

## 2019-06-04 DIAGNOSIS — R202 Paresthesia of skin: Secondary | ICD-10-CM | POA: Insufficient documentation

## 2019-06-04 NOTE — Progress Notes (Signed)
Vision Screen w/ correction: L: 20/20-1 R: 20/20

## 2019-06-04 NOTE — Progress Notes (Signed)
PATIENT: Christy Alexander DOB: 1980-10-21  Chief Complaint  Patient presents with  . Multiple Concerns    Reports concerns about the following symptoms:  blurred/double vision, voice cracking/hoarseness, stress incontinence, constipation, weak hand strength, tingling in toes/fingers, fatigue.  [facility]    Melida Quitter, MD - referring MD  . PCP    Pomposini, Cherly Anderson, MD     HISTORICAL  Christy Alexander 38 year old female, seen in request by ENT Dr. Melida Quitter and her primary care physician Dr. Harmon Pier, Cherly Anderson for evaluation of constellation of complaints, initial evaluation was #15 2020.  I have reviewed and summarized the referring note from the referring physician.  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  She had a history of lumbar decompression surgery for right lumbar radiculopathy many years ago, mild low back pain, recent onset of numbness tingling lateral fourth and fifth toes, in addition she complains of symptoms over the years, this including double vision, difficulty focusing, she described monocular double vision, was seen by different ophthalmologist over the past few years, was no significant abnormality found, she also had a history of right thyroidectomy in May 2019, following the surgery, she noticed significant voice quality change, she can no longer seen, she was seen by ENT, there was no significant structural abnormality identified, she also complains of chronic stress incontinence, chronic constipation I reviewed ENT evaluation by Dr. Redmond Baseman on April 18, 2019, dysphonia, laryngeal pharyngeal reflux, suggested continue speech therapy, Prilosec treatment   REVIEW OF SYSTEMS: Full 14 system review of systems performed and notable only for as above All other review of systems were negative.  ALLERGIES: Allergies  Allergen Reactions  . Aspirin Swelling    hoarseness  . Chlorhexidine  Dermatitis  . Contrast Media [Iodinated Diagnostic Agents] Hives and Itching  . Nsaids Other (See Comments)    Eyes swelling/loss of voice    HOME MEDICATIONS: Current Outpatient Medications  Medication Sig Dispense Refill  . acetaminophen (TYLENOL) 500 MG tablet Take 1,000 mg by mouth every 6 (six) hours as needed for headache.     . Ascorbic Acid (VITAMIN C) 1000 MG tablet Take 1,000 mg by mouth at bedtime.    . cetirizine (ZYRTEC) 10 MG tablet Take 10 mg by mouth at bedtime.     . diphenhydrAMINE (BENADRYL) 2 % cream Apply 1 application topically 3 (three) times daily as needed (for hives.).    Marland Kitchen diphenhydrAMINE (BENADRYL) 25 mg capsule Take 25-50 mg by mouth every 6 (six) hours as needed (for hives.).     Marland Kitchen Docusate Sodium 100 MG capsule Take 200 mg by mouth at bedtime.     Marland Kitchen EPINEPHrine (EPIPEN 2-PAK) 0.3 mg/0.3 mL IJ SOAJ injection Inject 0.3 mLs (0.3 mg total) into the muscle once. (Patient taking differently: Inject 0.3 mg into the muscle daily as needed (for anaphylatic reaction.). ) 2 Device 1  . meloxicam (MOBIC) 15 MG tablet Take 15 mg by mouth daily as needed (for back/joint pain).    Marland Kitchen omeprazole (PRILOSEC) 40 MG capsule Take 40 mg by mouth 2 (two) times daily.    Marland Kitchen VITAMIN D PO Take 1,000 Units by mouth daily.     No current facility-administered medications for this visit.     PAST MEDICAL HISTORY: Past Medical History:  Diagnosis Date  . Anxiety   . Back pain at L4-L5 level   . Delayed pressure urticaria dx age 76  trauma to skin causes deep tissue  swelling  . GERD (gastroesophageal reflux disease)   . Lower back pain    l4 to l5  . Right thyroid nodule   . TMJ (dislocation of temporomandibular joint)     PAST SURGICAL HISTORY: Past Surgical History:  Procedure Laterality Date  . LUMBAR LAMINECTOMY  2006   L4 and L5 discectomy micro, hemilaminectomy, had conscious sedation for   . THYROID LOBECTOMY Right 02/01/2018   Procedure: RIGHT THYROID LOBECTOMY;   Surgeon: Armandina Gemma, MD;  Location: [facility];  Service: General;  Laterality: Right;  . WISDOM TOOTH EXTRACTION  2001    FAMILY HISTORY: Family History  Problem Relation Age of Onset  . Diabetes type II Mother   . Heart disease Mother   . Sleep apnea Mother   . Tongue cancer Father   . Glaucoma Father   . Heart disease Father   . Diabetes type II Brother   . Cataracts Brother   . Heart attack Maternal Grandmother   . Heart disease Maternal Grandmother   . Other Maternal Grandfather        worked in [employer]  . Colon cancer Paternal Grandmother   . Diabetes Paternal Grandfather   . Heart disease Paternal Grandfather   . Allergic rhinitis Neg Hx   . Angioedema Neg Hx   . Asthma Neg Hx   . Eczema Neg Hx   . Immunodeficiency Neg Hx   . Urticaria Neg Hx     SOCIAL HISTORY: Social History   Socioeconomic History  . Marital status: Married    Spouse name: Not on file  . Number of children: 0  . Years of education: college  . Highest education level: Master's degree (e.g., MA, MS, MEng, MEd, MSW, MBA)  Occupational History  . Occupation: Therapist, sports  Social Needs  . Financial resource strain: Not on file  . Food insecurity    Worry: Never true    Inability: Never true  . Transportation needs    Medical: Not on file    Non-medical: Not on file  Tobacco Use  . Smoking status: Never Smoker  . Smokeless tobacco: Never Used  Substance and Sexual Activity  . Alcohol use: No  . Drug use: No  . Sexual activity: Not on file  Lifestyle  . Physical activity    Days per week: Not on file    Minutes per session: Not on file  . Stress: Not on file  Relationships  . Social Herbalist on phone: Not on file    Gets together: Not on file    Attends religious service: Not on file    Active member of club or organization: Not on file    Attends meetings of clubs or organizations: Not on file    Relationship status: Not on file  . Intimate partner violence    Fear of  current or ex partner: Not on file    Emotionally abused: Not on file    Physically abused: Not on file    Forced sexual activity: Not on file  Other Topics Concern  . Not on file  Social History Narrative   Lives at home with her husband.   Right-handed.   1 cup coffee per day.     PHYSICAL EXAM   Vitals:   06/04/19 0819  BP: 124/79  Pulse: 85  Temp: (!) 97.1 F (36.2 C)  Weight: 240 lb 12 oz (109.2 kg)  Height: 5\' 4"  (  1.626 m)    Not recorded      Body mass index is 41.32 kg/m.  PHYSICAL EXAMNIATION:  Gen: NAD, conversant, well nourised, well groomed                     Cardiovascular: Regular rate rhythm, no peripheral edema, warm, nontender. Eyes: Conjunctivae clear without exudates or hemorrhage Neck: Supple, no carotid bruits. Pulmonary: Clear to auscultation bilaterally   NEUROLOGICAL EXAM:  MENTAL STATUS: Speech:    Speech is normal; fluent and spontaneous with normal comprehension.  Cognition:     Orientation to time, place and person     Normal recent and remote memory     Normal Attention span and concentration     Normal Language, naming, repeating,spontaneous speech     Fund of knowledge   CRANIAL NERVES: CN II: Visual fields are full to confrontation.  Pupils are round equal and briskly reactive to light. CN III, IV, VI: extraocular movement are normal. No ptosis. CN V: Facial sensation is intact to pinprick in all 3 divisions bilaterally. Corneal responses are intact.  CN VII: Face is symmetric with normal eye closure and smile. CN VIII: Hearing is normal to causal conversation. CN IX, X: Palate elevates symmetrically. Phonation is normal. CN XI: Head turning and shoulder shrug are intact CN XII: Tongue is midline with normal movements and no atrophy.  MOTOR: There is no pronator drift of out-stretched arms. Muscle bulk and tone are normal. Muscle strength is normal.  REFLEXES: Reflexes are 2+ and symmetric at the biceps, triceps, knees,  and ankles. Plantar responses are flexor.  SENSORY: Intact to light touch, pinprick, positional sensation and vibratory sensation are intact in fingers and toes.  COORDINATION: Rapid alternating movements and fine finger movements are intact. There is no dysmetria on finger-to-nose and heel-knee-shin.    GAIT/STANCE: Posture is normal. Gait is steady with normal steps, base, arm swing, and turning. Heel and toe walking are normal. Tandem gait is normal.  Romberg is absent.   DIAGNOSTIC DATA (LABS, IMAGING, TESTING) - I reviewed patient records, labs, notes, testing and imaging myself where available.   ASSESSMENT AND PLAN  Christy Alexander is a 38 y.o. female   Constellation of complaints, including lower extremity paresthesia, voice change, double vision  Proceed with MRI of the brain, cervical spine,  Laboratory evaluation to rule out neuromuscular junctional disorders   Christy Alexander, M.D. Ph.D.  [facility] [address] [place], [place] [address] Ph: [phone] Fax: [phone]  CC: Melida Quitter, MD, Pomposini, Cherly Anderson, MD

## 2019-06-05 ENCOUNTER — Telehealth: Payer: Self-pay | Admitting: Neurology

## 2019-06-05 ENCOUNTER — Encounter: Payer: Self-pay | Admitting: Neurology

## 2019-06-05 LAB — COMPREHENSIVE METABOLIC PANEL
ALT: 30 IU/L (ref 0–32)
AST: 21 IU/L (ref 0–40)
Albumin/Globulin Ratio: 1.7 (ref 1.2–2.2)
Albumin: 4.3 g/dL (ref 3.8–4.8)
Alkaline Phosphatase: 80 IU/L (ref 39–117)
BUN/Creatinine Ratio: 15 (ref 9–23)
BUN: 10 mg/dL (ref 6–20)
Bilirubin Total: 0.3 mg/dL (ref 0.0–1.2)
CO2: 23 mmol/L (ref 20–29)
Calcium: 8.8 mg/dL (ref 8.7–10.2)
Chloride: 104 mmol/L (ref 96–106)
Creatinine, Ser: 0.65 mg/dL (ref 0.57–1.00)
GFR calc Af Amer: 132 mL/min/{1.73_m2} (ref >59)
GFR calc non Af Amer: 115 mL/min/{1.73_m2} (ref >59)
Globulin, Total: 2.5 g/dL (ref 1.5–4.5)
Glucose: 99 mg/dL (ref 65–99)
Potassium: 4.5 mmol/L (ref 3.5–5.2)
Sodium: 140 mmol/L (ref 134–144)
Total Protein: 6.8 g/dL (ref 6.0–8.5)

## 2019-06-05 LAB — PROTEIN ELECTROPHORESIS
A/G Ratio: 1.1 (ref 0.7–1.7)
Albumin ELP: 3.6 g/dL (ref 2.9–4.4)
Alpha 1: 0.2 g/dL (ref 0.0–0.4)
Alpha 2: 0.9 g/dL (ref 0.4–1.0)
Beta: 1.2 g/dL (ref 0.7–1.3)
Gamma Globulin: 0.9 g/dL (ref 0.4–1.8)
Globulin, Total: 3.2 g/dL (ref 2.2–3.9)

## 2019-06-05 LAB — CBC WITH DIFFERENTIAL
Basophils Absolute: 0.1 10*3/uL (ref 0.0–0.2)
Basos: 1 %
EOS (ABSOLUTE): 0.1 10*3/uL (ref 0.0–0.4)
Eos: 2 %
Hematocrit: 39.4 % (ref 34.0–46.6)
Hemoglobin: 12.9 g/dL (ref 11.1–15.9)
Immature Grans (Abs): 0 10*3/uL (ref 0.0–0.1)
Immature Granulocytes: 0 %
Lymphocytes Absolute: 1.8 10*3/uL (ref 0.7–3.1)
Lymphs: 28 %
MCH: 28.8 pg (ref 26.6–33.0)
MCHC: 32.7 g/dL (ref 31.5–35.7)
MCV: 88 fL (ref 79–97)
Monocytes Absolute: 0.4 10*3/uL (ref 0.1–0.9)
Monocytes: 7 %
Neutrophils Absolute: 3.9 10*3/uL (ref 1.4–7.0)
Neutrophils: 62 %
RBC: 4.48 x10E6/uL (ref 3.77–5.28)
RDW: 13.5 % (ref 11.7–15.4)
WBC: 6.3 10*3/uL (ref 3.4–10.8)

## 2019-06-05 LAB — FOLATE: Folate: 9.7 ng/mL (ref >3.0)

## 2019-06-05 LAB — CK: Total CK: 45 U/L (ref 32–182)

## 2019-06-05 LAB — TSH: TSH: 2.52 u[IU]/mL (ref 0.450–4.500)

## 2019-06-05 LAB — VITAMIN B12: Vitamin B-12: 402 pg/mL (ref 232–1245)

## 2019-06-05 LAB — RPR: RPR Ser Ql: NONREACTIVE

## 2019-06-05 LAB — SEDIMENTATION RATE: Sed Rate: 22 mm/hr (ref 0–32)

## 2019-06-05 LAB — C-REACTIVE PROTEIN: CRP: 13 mg/L — ABNORMAL HIGH (ref 0–10)

## 2019-06-05 LAB — ACETYLCHOLINE RECEPTOR, BINDING: AChR Binding Ab, Serum: 0.03 nmol/L (ref 0.00–0.24)

## 2019-06-05 LAB — VITAMIN D 25 HYDROXY (VIT D DEFICIENCY, FRACTURES): Vit D, 25-Hydroxy: 31.3 ng/mL (ref 30.0–100.0)

## 2019-06-05 NOTE — Telephone Encounter (Signed)
LVM for pt to call back about scheduling mri  [facility] Auth: [place] Ref # [id number]

## 2019-06-05 NOTE — Telephone Encounter (Signed)
Patient returned my call she is scheduled for 06/11/19 at [facility]. No to the covid questions.

## 2019-06-11 ENCOUNTER — Other Ambulatory Visit: Payer: Self-pay

## 2019-06-11 ENCOUNTER — Ambulatory Visit: Payer: 59

## 2019-06-11 DIAGNOSIS — R202 Paresthesia of skin: Secondary | ICD-10-CM | POA: Diagnosis not present

## 2019-06-11 DIAGNOSIS — R531 Weakness: Secondary | ICD-10-CM

## 2019-06-11 DIAGNOSIS — H532 Diplopia: Secondary | ICD-10-CM

## 2019-06-11 MED FILL — OMEPRAZOLE 40 MG CPDR: 40 | 30 days supply | Qty: 60 | Fill #0

## 2019-06-19 DIAGNOSIS — E669 Obesity, unspecified: Secondary | ICD-10-CM | POA: Diagnosis not present

## 2019-06-19 DIAGNOSIS — Z01419 Encounter for gynecological examination (general) (routine) without abnormal findings: Secondary | ICD-10-CM | POA: Diagnosis not present

## 2019-06-19 DIAGNOSIS — Z719 Counseling, unspecified: Secondary | ICD-10-CM | POA: Diagnosis not present

## 2019-06-19 DIAGNOSIS — Z2821 Immunization not carried out because of patient refusal: Secondary | ICD-10-CM | POA: Diagnosis not present

## 2019-06-19 MED FILL — HYDROXYZINE PAMOATE 25 MG C: 25 | 30 days supply | Qty: 30 | Fill #0

## 2019-06-19 MED FILL — HYDROCORTISONE 2.5% OINT: 2.5 | 28 days supply | Qty: 28 | Fill #0

## 2019-06-26 DIAGNOSIS — N3946 Mixed incontinence: Secondary | ICD-10-CM | POA: Diagnosis not present

## 2019-06-26 DIAGNOSIS — R3121 Asymptomatic microscopic hematuria: Secondary | ICD-10-CM | POA: Diagnosis not present

## 2019-07-29 MED FILL — OMEPRAZOLE 40 MG CPDR: 40 | 30 days supply | Qty: 60 | Fill #0

## 2019-08-05 ENCOUNTER — Ambulatory Visit: Payer: 59 | Admitting: Neurology

## 2019-08-23 ENCOUNTER — Ambulatory Visit (INDEPENDENT_AMBULATORY_CARE_PROVIDER_SITE_OTHER): Payer: 59

## 2019-08-23 ENCOUNTER — Other Ambulatory Visit: Payer: Self-pay

## 2019-08-23 ENCOUNTER — Ambulatory Visit
Admission: EM | Admit: 2019-08-23 | Discharge: 2019-08-23 | Disposition: A | Discharge status: Home / Self Care | Payer: 59 | Attending: Emergency Medicine | Admitting: Emergency Medicine

## 2019-08-23 DIAGNOSIS — W25XXXA Contact with sharp glass, initial encounter: Secondary | ICD-10-CM

## 2019-08-23 DIAGNOSIS — M25511 Pain in right shoulder: Secondary | ICD-10-CM

## 2019-08-23 DIAGNOSIS — W11XXXA Fall on and from ladder, initial encounter: Secondary | ICD-10-CM

## 2019-08-23 DIAGNOSIS — S21201A Unspecified open wound of right back wall of thorax without penetration into thoracic cavity, initial encounter: Secondary | ICD-10-CM | POA: Diagnosis not present

## 2019-08-23 DIAGNOSIS — S4991XA Unspecified injury of right shoulder and upper arm, initial encounter: Secondary | ICD-10-CM | POA: Diagnosis not present

## 2019-08-23 MED ORDER — MUPIROCIN 2 % EX OINT: 1.0000 "application " | TOPICAL_OINTMENT | Freq: Two times a day (BID) | CUTANEOUS | 0 refills | Status: DC

## 2019-08-23 MED FILL — OMEPRAZOLE 40 MG CPDR: 40 | 30 days supply | Qty: 60 | Fill #1
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
## 2019-08-23 NOTE — ED Triage Notes (Signed)
Pt fell backwards against mirror and thinks there may be glass in back that has become red and irritated

## 2019-08-23 NOTE — Discharge Instructions (Signed)
X-rays negative for foreign body Wash with warm water and mild soap Keep covered Apply neosporin or bactroban twice daily after dressing changes Follow up with PCP if symptoms persists Return or go to the ED if you have any new or worsening symptoms such as increased pain, redness, swelling, discharge, high fever, night sweats, abdominal pain, etc..Marland Kitchen

## 2019-09-18 ENCOUNTER — Other Ambulatory Visit: Payer: Self-pay

## 2019-09-18 ENCOUNTER — Ambulatory Visit: Payer: 59 | Attending: Internal Medicine

## 2019-09-18 DIAGNOSIS — Z20828 Contact with and (suspected) exposure to other viral communicable diseases: Secondary | ICD-10-CM | POA: Diagnosis not present

## 2019-09-18 DIAGNOSIS — Z20822 Contact with and (suspected) exposure to covid-19: Secondary | ICD-10-CM

## 2019-09-20 LAB — NOVEL CORONAVIRUS, NAA: SARS-CoV-2, NAA: DETECTED — AB

## 2019-09-21 ENCOUNTER — Other Ambulatory Visit: Payer: Self-pay

## 2019-09-21 ENCOUNTER — Ambulatory Visit
Admission: EM | Admit: 2019-09-21 | Discharge: 2019-09-21 | Disposition: A | Discharge status: Home / Self Care | Payer: 59 | Attending: Emergency Medicine | Admitting: Emergency Medicine

## 2019-09-21 ENCOUNTER — Ambulatory Visit (INDEPENDENT_AMBULATORY_CARE_PROVIDER_SITE_OTHER): Payer: 59

## 2019-09-21 DIAGNOSIS — U071 COVID-19: Secondary | ICD-10-CM

## 2019-09-21 DIAGNOSIS — R0989 Other specified symptoms and signs involving the circulatory and respiratory systems: Secondary | ICD-10-CM | POA: Diagnosis not present

## 2019-09-21 DIAGNOSIS — R0789 Other chest pain: Secondary | ICD-10-CM

## 2019-09-21 DIAGNOSIS — R0981 Nasal congestion: Secondary | ICD-10-CM

## 2019-09-21 MED FILL — OMEPRAZOLE 40 MG CPDR: 40 | 30 days supply | Qty: 60 | Fill #2

## 2019-09-21 NOTE — ED Triage Notes (Signed)
Pt presents to UC stating she tested covid positive on 09/18/2019. Pt started to feel chest tightness last night and heard crackles in her chest this morning. Pt requests xray

## 2019-09-21 NOTE — Discharge Instructions (Addendum)
Chest x-ray was negative for viral pneumonia Advised patient to quarantine To go to ED for worsening of symptoms

## 2019-09-21 NOTE — ED Provider Notes (Signed)
[facility]    CSN: [id number] Arrival date & time: 09/21/19  1530      History   Chief Complaint Chief Complaint  Patient presents with  . chest tightness    HPI Christy Alexander is a 39 y.o. female.   Christy Alexander presented to the urgent care for complaint of chest tightness for the past 2 to 3 days.  Reported she tested positive for COVID-19.  Denies sick exposure to COVID, flu or strep.  Denies recent travel.  Denies aggravating or alleviating symptoms.  Denies previous COVID infection.   Denies fever, chills, fatigue, nasal congestion, rhinorrhea, sore throat, cough, SOB, wheezing, chest pain, nausea, vomiting, changes in bowel or bladder habits.       Past Medical History:  Diagnosis Date  . Anxiety   . Back pain at L4-L5 level   . Delayed pressure urticaria dx age 89   trauma to skin causes deep tissue  swelling  . GERD (gastroesophageal reflux disease)   . Lower back pain    l4 to l5  . Right thyroid nodule   . TMJ (dislocation of temporomandibular joint)     Patient Active Problem List   Diagnosis Date Noted  . Double vision 06/04/2019  . Paresthesia 06/04/2019  . Weakness 06/04/2019  . Right thyroid nodule 01/27/2018  . AP (abdominal pain) 03/18/2016  . 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 Drug allergy 01/26/2016  . 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 Allergic rhinitis due to pollen 01/26/2016  . Arthritis 01/26/2016    Past Surgical History:  Procedure Laterality Date  . LUMBAR LAMINECTOMY  2006   L4 and L5 discectomy micro, hemilaminectomy, had conscious sedation for   . THYROID LOBECTOMY Right 02/01/2018   Procedure: RIGHT THYROID LOBECTOMY;  Surgeon: Armandina Gemma, MD;  Location: [facility];  Service: General;  Laterality: Right;  . WISDOM TOOTH EXTRACTION  2001    OB History   No obstetric history on file.      Home Medications    Prior to Admission medications   Medication Sig Start Date End Date Taking? Authorizing Provider  acetaminophen  (TYLENOL) 500 MG tablet Take 1,000 mg by mouth every 6 (six) hours as needed for headache.     [provider]  Ascorbic Acid (VITAMIN C) 1000 MG tablet Take 1,000 mg by mouth at bedtime.    [provider]  cetirizine (ZYRTEC) 10 MG tablet Take 10 mg by mouth at bedtime.     [provider]  diphenhydrAMINE (BENADRYL) 2 % cream Apply 1 application topically 3 (three) times daily as needed (for hives.).    [provider]  diphenhydrAMINE (BENADRYL) 25 mg capsule Take 25-50 mg by mouth every 6 (six) hours as needed (for hives.).     [provider]  meloxicam (MOBIC) 15 MG tablet Take 15 mg by mouth daily as needed (for back/joint pain).    [provider]  mupirocin ointment (BACTROBAN) 2 % Apply 1 application topically 2 (two) times daily. 08/23/19   [place], [place], PA-C  omeprazole (PRILOSEC) 40 MG capsule Take 40 mg by mouth 2 (two) times daily.    [provider]  VITAMIN D PO Take 1,000 Units by mouth daily.    [provider]    Family History Family History  Problem Relation Age of Onset  . Diabetes type II Mother   . Heart disease Mother   . Sleep apnea Mother   . Tongue cancer Father   . Glaucoma Father   .  Heart disease Father   . Diabetes type II Brother   . Cataracts Brother   . Heart attack Maternal Grandmother   . Heart disease Maternal Grandmother   . Other Maternal Grandfather        worked in [employer]  . Colon cancer Paternal Grandmother   . Diabetes Paternal Grandfather   . Heart disease Paternal Grandfather   . Allergic rhinitis Neg Hx   . Angioedema Neg Hx   . Asthma Neg Hx   . Eczema Neg Hx   . Immunodeficiency Neg Hx   . Urticaria Neg Hx     Social History Social History   Tobacco Use  . Smoking status: Never Smoker  . Smokeless tobacco: Never Used  Substance Use Topics  . Alcohol use: No  . Drug use: No     Allergies   Aspirin, Chlorhexidine, Contrast media [iodinated  diagnostic agents], and Nsaids   Review of Systems Review of Systems  Constitutional: Negative.   HENT: Negative.   Respiratory: Positive for chest tightness.   Cardiovascular: Negative.   Gastrointestinal: Negative.   Neurological: Negative.      Physical Exam Triage Vital Signs ED Triage Vitals  Enc Vitals Group     BP 09/21/19 1618 115/64     Pulse Rate 09/21/19 1618 97     Resp 09/21/19 1618 16     Temp 09/21/19 1618 98.7 F (37.1 C)     Temp Source 09/21/19 1618 Oral     SpO2 09/21/19 1618 97 %     Weight --      Height --      Head Circumference --      Peak Flow --      Pain Score 09/21/19 1619 2     Pain Loc --      Pain Edu? --      Excl. in [place]? --    No data found.  Updated Vital Signs BP 115/64 (BP Location: Right Arm)   Pulse 97   Temp 98.7 F (37.1 C) (Oral)   Resp 16   SpO2 97%   Visual Acuity Right Eye Distance:   Left Eye Distance:   Bilateral Distance:    Right Eye Near:   Left Eye Near:    Bilateral Near:     Physical Exam Constitutional:      General: She is not in acute distress.    Appearance: Normal appearance. She is normal weight. She is not ill-appearing or toxic-appearing.  HENT:     Head: Normocephalic.     Right Ear: Tympanic membrane, ear canal and external ear normal. There is no impacted cerumen.     Left Ear: Tympanic membrane, ear canal and external ear normal. There is no impacted cerumen.     Nose: Nose normal. No congestion.     Mouth/Throat:     Mouth: Mucous membranes are moist.     Pharynx: No oropharyngeal exudate or posterior oropharyngeal erythema.  Cardiovascular:     Rate and Rhythm: Normal rate and regular rhythm.     Pulses: Normal pulses.     Heart sounds: Normal heart sounds. No murmur.  Pulmonary:     Effort: Pulmonary effort is normal. No respiratory distress.     Breath sounds: No wheezing or rhonchi.  Chest:     Chest wall: No tenderness.  Abdominal:     General: Abdomen is flat. Bowel  sounds are normal. There is no distension.     Palpations: There  is no mass.  Skin:    Capillary Refill: Capillary refill takes less than 2 seconds.  Neurological:     Mental Status: She is alert and oriented to person, place, and time.      UC Treatments / Results  Labs (all labs ordered are listed, but only abnormal results are displayed) Labs Reviewed - No data to display  EKG   Radiology DG Chest 2 View  Result Date: 09/21/2019 CLINICAL DATA:  Patient is positive for Covid, chest pressure, chest congestion x 2 days. Hx of thyroidectomy. EXAM: CHEST - 2 VIEW COMPARISON:  Chest radiograph 01/26/2018 FINDINGS: The heart size and mediastinal contours are within normal limits. There is mild thickening of the interstitium bilaterally. No focal infiltrate. No pneumothorax or pleural effusion. The visualized skeletal structures are unremarkable. IMPRESSION: Mild thickening of the interstitium bilaterally as can be seen in bronchitis/viral infection. No evidence of pneumonia. Electronically Signed   By: Audie Pinto M.D.   On: 09/21/2019 16:32    Procedures Procedures (including critical care time)  Medications Ordered in UC Medications - No data to display  Initial Impression / Assessment and Plan / UC Course  I have reviewed the triage vital signs and the nursing notes.  Pertinent labs & imaging results that were available during my care of the patient were reviewed by me and considered in my medical decision making (see chart for details).     Chest x-ray was ordered and result review.  Patient was negative for pneumonia.  Advised to quarantine.  To go to ED for worsening of symptoms.  Patient verbalized understanding of the plan of care.  Final Clinical Impressions(s) / UC Diagnoses   Final diagnoses:  COVID-19 virus infection     Discharge Instructions     Chest x-ray was negative for viral pneumonia Advised patient to quarantine To go to ED for worsening of  symptoms    ED Prescriptions    None     PDMP not reviewed this encounter.   Emerson Monte, [place] 09/21/19 1706

## 2019-09-23 ENCOUNTER — Ambulatory Visit: Payer: Self-pay | Admitting: *Deleted

## 2019-09-23 ENCOUNTER — Telehealth: Payer: Self-pay | Admitting: Internal Medicine

## 2019-09-23 NOTE — Telephone Encounter (Signed)
Patient missed call with her COVID test results. 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 Patient reviewed her COVID test result on Mychart.   Returned call to patient in regards to the MyChart message and missed phone call. Pt stated that she tested positive on 09/18/2019. She was exposed on 12/23 and started having symptoms on 12/27. Her symptoms have decreased now, denies fever. She notified her supervisor and [employer] was notified. She works from home. Advised to notify her pcp. And to continue with the 14 day quarantine.  She voiced understanding.

## 2019-10-07 ENCOUNTER — Telehealth: Payer: 59 | Admitting: Physician Assistant

## 2019-10-07 DIAGNOSIS — M545 Low back pain, unspecified: Secondary | ICD-10-CM

## 2019-10-07 DIAGNOSIS — R310 Gross hematuria: Secondary | ICD-10-CM

## 2019-10-07 DIAGNOSIS — R300 Dysuria: Secondary | ICD-10-CM

## 2019-10-07 NOTE — Progress Notes (Signed)
Good afternoon Bethsaida,   I am concerned about your kidneys, given your symptoms of back pain and blood in your urine.  I would feel more comfortable if you were seen and evaluated in person.   Based on what you shared with me, I feel your condition warrants further evaluation and I recommend that you be seen for a face to face office visit.   NOTE: If you entered your credit card information for this eVisit, you will not be charged. You may see a "hold" on your card for the $35 but that hold will drop off and you will not have a charge processed.   If you are having a true medical emergency please call 911.      For an urgent face to face visit, [facility] has five urgent care centers for your convenience:      NEW:  [facility] [place] [facility] [place] Get Driving Directions [id number] [address], [place] [address] . 10 am - 6pm Monday - Friday    [facility] [place] [facility]) Get Driving Directions [id number] [address] [place], [place] [address] . 10 am to 8 pm Monday-Friday . 12 pm to 8 pm [facility] Get Driving Directions [id number] [place] [address], [place], [place] [address] . 8 am to 8 pm Monday-Friday . 9 am to 6 pm Saturday . 11 am to 6 pm Sunday     [facility] at [facility] Get Driving Directions  [id number] [address] [place], [place] [address] . 8 am to 8 pm Monday-Friday . 8 am to 4 pm [facility] at [place] Get Driving Directions [id number] [address] [place], [place] [address] . 12 pm to 6 pm Monday-Friday      Your e-visit answers were reviewed by a board certified advanced clinical practitioner to complete your personal care plan.  Thank you for using e-Visits.

## 2019-10-16 DIAGNOSIS — J029 Acute pharyngitis, unspecified: Secondary | ICD-10-CM | POA: Diagnosis not present

## 2019-10-16 DIAGNOSIS — H9202 Otalgia, left ear: Secondary | ICD-10-CM | POA: Diagnosis not present

## 2019-10-22 MED FILL — OMEPRAZOLE 40 MG CPDR: 40 | 30 days supply | Qty: 60 | Fill #3

## 2019-10-31 DIAGNOSIS — R070 Pain in throat: Secondary | ICD-10-CM | POA: Insufficient documentation

## 2019-10-31 DIAGNOSIS — M26623 Arthralgia of bilateral temporomandibular joint: Secondary | ICD-10-CM | POA: Diagnosis not present

## 2019-10-31 DIAGNOSIS — H9203 Otalgia, bilateral: Secondary | ICD-10-CM | POA: Diagnosis not present

## 2019-10-31 DIAGNOSIS — M26629 Arthralgia of temporomandibular joint, unspecified side: Secondary | ICD-10-CM | POA: Insufficient documentation

## 2019-11-20 MED FILL — OMEPRAZOLE 40 MG CPDR: 40 | 30 days supply | Qty: 60 | Fill #4

## 2019-11-26 ENCOUNTER — Other Ambulatory Visit (HOSPITAL_COMMUNITY): Payer: Self-pay | Admitting: Internal Medicine

## 2019-11-26 ENCOUNTER — Other Ambulatory Visit: Payer: Self-pay | Admitting: Internal Medicine

## 2019-11-26 DIAGNOSIS — E041 Nontoxic single thyroid nodule: Secondary | ICD-10-CM | POA: Diagnosis not present

## 2019-11-26 DIAGNOSIS — Z79899 Other long term (current) drug therapy: Secondary | ICD-10-CM | POA: Diagnosis not present

## 2019-11-26 DIAGNOSIS — R739 Hyperglycemia, unspecified: Secondary | ICD-10-CM | POA: Diagnosis not present

## 2019-11-26 DIAGNOSIS — J302 Other seasonal allergic rhinitis: Secondary | ICD-10-CM | POA: Diagnosis not present

## 2019-11-26 DIAGNOSIS — E785 Hyperlipidemia, unspecified: Secondary | ICD-10-CM | POA: Diagnosis not present

## 2019-11-26 DIAGNOSIS — K219 Gastro-esophageal reflux disease without esophagitis: Secondary | ICD-10-CM | POA: Diagnosis not present

## 2019-11-26 DIAGNOSIS — Z0000 Encounter for general adult medical examination without abnormal findings: Secondary | ICD-10-CM | POA: Diagnosis not present

## 2019-11-26 DIAGNOSIS — E559 Vitamin D deficiency, unspecified: Secondary | ICD-10-CM | POA: Diagnosis not present

## 2019-11-26 DIAGNOSIS — M2550 Pain in unspecified joint: Secondary | ICD-10-CM | POA: Diagnosis not present

## 2019-11-26 DIAGNOSIS — R7309 Other abnormal glucose: Secondary | ICD-10-CM | POA: Diagnosis not present

## 2019-12-04 ENCOUNTER — Other Ambulatory Visit: Payer: Self-pay

## 2019-12-04 ENCOUNTER — Ambulatory Visit (HOSPITAL_COMMUNITY)
Admission: RE | Admit: 2019-12-04 | Discharge: 2019-12-04 | Disposition: A | Discharge status: Home / Self Care | Payer: 59 | Source: Ambulatory Visit | Attending: Internal Medicine | Admitting: Internal Medicine

## 2019-12-04 DIAGNOSIS — E041 Nontoxic single thyroid nodule: Secondary | ICD-10-CM | POA: Diagnosis not present

## 2019-12-17 ENCOUNTER — Other Ambulatory Visit (HOSPITAL_COMMUNITY): Payer: Self-pay | Admitting: Internal Medicine

## 2019-12-17 DIAGNOSIS — R070 Pain in throat: Secondary | ICD-10-CM | POA: Diagnosis not present

## 2019-12-17 DIAGNOSIS — R490 Dysphonia: Secondary | ICD-10-CM | POA: Diagnosis not present

## 2019-12-17 DIAGNOSIS — K219 Gastro-esophageal reflux disease without esophagitis: Secondary | ICD-10-CM | POA: Diagnosis not present

## 2019-12-17 MED FILL — ESOMEPRAZOLE MAG DR 40 MG C: 40 | 30 days supply | Qty: 60 | Fill #0

## 2019-12-17 MED FILL — MELOXICAM 7.5 MG TABLET: 7.5 | 90 days supply | Qty: 90 | Fill #0

## 2019-12-18 ENCOUNTER — Telehealth: Payer: Self-pay | Admitting: Neurology

## 2019-12-18 NOTE — Telephone Encounter (Signed)
It is Ok to switch to different provider. 

## 2019-12-18 NOTE — Telephone Encounter (Signed)
Pt called back to check on when she will hear back in regards to switching providers informed staff member would reach back out. Pt would like to know what she should do until a decision has been made as she is currently experiencing sharp back pain (right side) when moving

## 2019-12-18 NOTE — Telephone Encounter (Addendum)
I returned the call to the patient. She has a history of intermittent, low back pain since having surgery at age 39. However, she is reporting a new onset of back pain that is different than her normal discomfort. I ask her to call her PCP for an initial evaluation. I explained the rationale behind the request is to allow the PCP to determine which type of specialist would be more appropriate for her to see (neurology vs orthopaedics). She verbalized understanding and will contact her PCP first. States that if she needs to return to our office, she would like to be scheduled with a different provider due to personality differences. Dr. Krista Blue has approved the switch. It will be addressed further if she needs to return to our office.

## 2019-12-18 NOTE — Telephone Encounter (Signed)
Pt called wanting to switch providers. Please advise.

## 2019-12-19 DIAGNOSIS — S39012A Strain of muscle, fascia and tendon of lower back, initial encounter: Secondary | ICD-10-CM | POA: Diagnosis not present

## 2019-12-22 ENCOUNTER — Ambulatory Visit
Admission: EM | Admit: 2019-12-22 | Discharge: 2019-12-22 | Disposition: A | Discharge status: Home / Self Care | Payer: 59 | Attending: Emergency Medicine | Admitting: Emergency Medicine

## 2019-12-22 ENCOUNTER — Other Ambulatory Visit: Payer: Self-pay

## 2019-12-22 DIAGNOSIS — M545 Low back pain, unspecified: Secondary | ICD-10-CM

## 2019-12-22 DIAGNOSIS — M62838 Other muscle spasm: Secondary | ICD-10-CM

## 2019-12-22 MED ORDER — CYCLOBENZAPRINE HCL 10 MG PO TABS: 10.0000 mg | ORAL_TABLET | Freq: Every day | ORAL | 0 refills | Status: DC

## 2019-12-22 MED ORDER — DEXAMETHASONE SODIUM PHOSPHATE 10 MG/ML IJ SOLN
10.0000 mg | Freq: Once | INTRAMUSCULAR | Status: AC
  Administered 2019-12-22: 13:00:00 10 mg via INTRAMUSCULAR

## 2019-12-22 MED ORDER — KETOROLAC TROMETHAMINE 30 MG/ML IJ SOLN
30.0000 mg | Freq: Once | INTRAMUSCULAR | Status: AC
  Administered 2019-12-22: 30 mg via INTRAMUSCULAR

## 2019-12-22 NOTE — ED Triage Notes (Signed)
Pt has had low back pain that started last Saturday , pt has mobic and prednisone without relief

## 2019-12-22 NOTE — ED Provider Notes (Signed)
[place]   [id number] 12/22/19 Arrival Time: 1228  CC: Back PAIN  SUBJECTIVE: History from: patient. Periann Barredo is a 39 y.o. female complains of RT low back pain x 1 week.  Denies a precipitating event or specific injury, but was working out prior to symptoms.  Localizes the pain to the RT low back.  Describes the pain as constant, burning and intermittently "seizing up" in character.  ">10"/10.  Has tried prednisone and mobic without relief.  Symptoms are made worse with leaning forward and at night.  Denies similar symptoms in the past.  Denies fever, chills, nausea, vomiting, erythema, ecchymosis, effusion, weakness, numbness and tingling, saddle paresthesias, loss of bowel or bladder function.      ROS: As per HPI.  All other pertinent ROS negative.     Past Medical History:  Diagnosis Date  . Anxiety   . Back pain at L4-L5 level   . Delayed pressure urticaria dx age 67   trauma to skin causes deep tissue  swelling  . GERD (gastroesophageal reflux disease)   . Lower back pain    l4 to l5  . Right thyroid nodule   . TMJ (dislocation of temporomandibular joint)    Past Surgical History:  Procedure Laterality Date  . LUMBAR LAMINECTOMY  2006   L4 and L5 discectomy micro, hemilaminectomy, had conscious sedation for   . THYROID LOBECTOMY Right 02/01/2018   Procedure: RIGHT THYROID LOBECTOMY;  Surgeon: Armandina Gemma, MD;  Location: [facility];  Service: General;  Laterality: Right;  . WISDOM TOOTH EXTRACTION  2001   Allergies  Allergen Reactions  . Aspirin Swelling    hoarseness  . Chlorhexidine Dermatitis  . Contrast Media [Iodinated Diagnostic Agents] Hives and Itching  . Nsaids Other (See Comments)    Eyes swelling/loss of voice   No current facility-administered medications on file prior to encounter.   Current Outpatient Medications on File Prior to Encounter  Medication Sig Dispense Refill  . acetaminophen (TYLENOL) 500 MG tablet Take 1,000 mg by mouth every  6 (six) hours as needed for headache.     . Ascorbic Acid (VITAMIN C) 1000 MG tablet Take 1,000 mg by mouth at bedtime.    . cetirizine (ZYRTEC) 10 MG tablet Take 10 mg by mouth at bedtime.     . diphenhydrAMINE (BENADRYL) 2 % cream Apply 1 application topically 3 (three) times daily as needed (for hives.).    Marland Kitchen diphenhydrAMINE (BENADRYL) 25 mg capsule Take 25-50 mg by mouth every 6 (six) hours as needed (for hives.).     Marland Kitchen meloxicam (MOBIC) 15 MG tablet Take 15 mg by mouth daily as needed (for back/joint pain).    . mupirocin ointment (BACTROBAN) 2 % Apply 1 application topically 2 (two) times daily. 30 g 0  . omeprazole (PRILOSEC) 40 MG capsule Take 40 mg by mouth 2 (two) times daily.    Marland Kitchen VITAMIN D PO Take 1,000 Units by mouth daily.     Social History   Socioeconomic History  . Marital status: Married    Spouse name: Not on file  . Number of children: 0  . Years of education: college  . Highest education level: Master's degree (e.g., MA, MS, MEng, MEd, MSW, MBA)  Occupational History  . Occupation: Therapist, sports  Tobacco Use  . Smoking status: Never Smoker  . Smokeless tobacco: Never Used  Substance and Sexual Activity  . Alcohol use: No  . Drug use: No  . Sexual activity: Not on file  Other Topics Concern  . Not on file  Social History Narrative   Lives at home with her husband.   Right-handed.   1 cup coffee per day.   Social Determinants of Health   Financial Resource Strain:   . Difficulty of Paying Living Expenses:   Food Insecurity:   . Worried About Charity fundraiser in the Last Year:   . Arboriculturist in the Last Year:   Transportation Needs:   . Film/video editor (Medical):   Marland Kitchen Lack of Transportation (Non-Medical):   Physical Activity:   . Days of Exercise per Week:   . Minutes of Exercise per Session:   Stress:   . Feeling of Stress :   Social Connections:   . Frequency of Communication with Friends and Family:   . Frequency of Social Gatherings with  Friends and Family:   . Attends Religious Services:   . Active Member of Clubs or Organizations:   . Attends Archivist Meetings:   Marland Kitchen Marital Status:   Intimate Partner Violence:   . Fear of Current or Ex-Partner:   . Emotionally Abused:   Marland Kitchen Physically Abused:   . Sexually Abused:    Family History  Problem Relation Age of Onset  . Diabetes type II Mother   . Heart disease Mother   . Sleep apnea Mother   . Tongue cancer Father   . Glaucoma Father   . Heart disease Father   . Diabetes type II Brother   . Cataracts Brother   . Heart attack Maternal Grandmother   . Heart disease Maternal Grandmother   . Other Maternal Grandfather        worked in [employer]  . Colon cancer Paternal Grandmother   . Diabetes Paternal Grandfather   . Heart disease Paternal Grandfather   . Allergic rhinitis Neg Hx   . Angioedema Neg Hx   . Asthma Neg Hx   . Eczema Neg Hx   . Immunodeficiency Neg Hx   . Urticaria Neg Hx     OBJECTIVE:  Vitals:   12/22/19 1237  BP: 127/85  Pulse: 78  Resp: 18  Temp: 98.6 F (37 C)  SpO2: 99%    General appearance: ALERT; in no acute distress.  Head: NCAT ENT: PERRL,  EOMI grossly; oropharynx Lungs: Normal respiratory effort; CTAB CV: RRR Musculoskeletal: Back  Inspection: Skin warm, dry, clear and intact without obvious erythema, effusion, or ecchymosis.  Palpation: TTP over RT lower back ROM: FROM active and passive Strength: 5/5 shld abduction, 5/5 shld adduction, 5/5 elbow flexion, 5/5 elbow extension, 5/5 grip strength, 4+/5 hip flexion, 5/5 hip extension, 5/5 knee flexion, 5/5 knee extension, 5/5 dorsiflexion, 5/5 plantar flexion Skin: warm and dry Neurologic: Ambulates with minimal difficulty; Sensation intact about the upper/ lower extremities Psychological: alert and cooperative; normal mood and affect   ASSESSMENT & PLAN:  1. Acute right-sided low back pain without sciatica   2. Muscle spasm     Meds ordered this encounter    Medications  . dexamethasone (DECADRON) injection 10 mg  . ketorolac (TORADOL) 30 MG/ML injection 30 mg  . cyclobenzaprine (FLEXERIL) 10 MG tablet    Sig: Take 1 tablet (10 mg total) by mouth at bedtime.    Dispense:  20 tablet    Refill:  0    Order Specific Question:   Supervising Provider    Answer:   Raylene Everts [id number]   Steroid shot and toradol shot  give in office Continue conservative management of rest, ice, heat, and gentle stretches/ massage Continue with prednisone as prescribed.   Take cyclobenzaprine at nighttime for symptomatic relief. Avoid driving or operating heavy machinery while using medication. Follow up with PCP if symptoms persist Return or go to the ER if you have any new or worsening symptoms (fever, chills, chest pain, abdominal pain, changes in bowel or bladder habits, pain radiating into lower legs, etc...)   Reviewed expectations re: course of current medical issues. Questions answered. Outlined signs and symptoms indicating need for more acute intervention. Patient verbalized understanding. After Visit Summary given.    Lestine Box, PA-C 12/22/19 1258

## 2019-12-22 NOTE — Discharge Instructions (Signed)
Steroid shot and toradol shot give in office Continue conservative management of rest, ice, heat, and gentle stretches/ massage Continue with prednisone as prescribed.   Take cyclobenzaprine at nighttime for symptomatic relief. Avoid driving or operating heavy machinery while using medication. Follow up with PCP if symptoms persist Return or go to the ER if you have any new or worsening symptoms (fever, chills, chest pain, abdominal pain, changes in bowel or bladder habits, pain radiating into lower legs, etc...)

## 2019-12-23 DIAGNOSIS — M961 Postlaminectomy syndrome, not elsewhere classified: Secondary | ICD-10-CM | POA: Diagnosis not present

## 2019-12-23 DIAGNOSIS — M7918 Myalgia, other site: Secondary | ICD-10-CM | POA: Diagnosis not present

## 2019-12-23 DIAGNOSIS — M545 Low back pain: Secondary | ICD-10-CM | POA: Diagnosis not present

## 2019-12-23 DIAGNOSIS — Z6841 Body Mass Index (BMI) 40.0 and over, adult: Secondary | ICD-10-CM | POA: Diagnosis not present

## 2019-12-26 DIAGNOSIS — R262 Difficulty in walking, not elsewhere classified: Secondary | ICD-10-CM | POA: Diagnosis not present

## 2019-12-26 DIAGNOSIS — M545 Low back pain: Secondary | ICD-10-CM | POA: Diagnosis not present

## 2019-12-26 DIAGNOSIS — M2569 Stiffness of other specified joint, not elsewhere classified: Secondary | ICD-10-CM | POA: Diagnosis not present

## 2019-12-26 DIAGNOSIS — M6281 Muscle weakness (generalized): Secondary | ICD-10-CM | POA: Diagnosis not present

## 2019-12-27 ENCOUNTER — Other Ambulatory Visit: Payer: Self-pay

## 2019-12-27 ENCOUNTER — Emergency Department (HOSPITAL_COMMUNITY): Payer: 59

## 2019-12-27 ENCOUNTER — Emergency Department (HOSPITAL_COMMUNITY)
Admission: EM | Admit: 2019-12-27 | Discharge: 2019-12-27 | Disposition: A | Discharge status: Home / Self Care | Payer: 59 | Attending: Emergency Medicine | Admitting: Emergency Medicine

## 2019-12-27 DIAGNOSIS — Y9389 Activity, other specified: Secondary | ICD-10-CM | POA: Insufficient documentation

## 2019-12-27 DIAGNOSIS — W19XXXA Unspecified fall, initial encounter: Secondary | ICD-10-CM | POA: Diagnosis not present

## 2019-12-27 DIAGNOSIS — R200 Anesthesia of skin: Secondary | ICD-10-CM | POA: Insufficient documentation

## 2019-12-27 DIAGNOSIS — Y929 Unspecified place or not applicable: Secondary | ICD-10-CM | POA: Diagnosis not present

## 2019-12-27 DIAGNOSIS — R202 Paresthesia of skin: Secondary | ICD-10-CM | POA: Insufficient documentation

## 2019-12-27 DIAGNOSIS — Y999 Unspecified external cause status: Secondary | ICD-10-CM | POA: Diagnosis not present

## 2019-12-27 LAB — DIFFERENTIAL
Abs Immature Granulocytes: 0.16 10*3/uL — ABNORMAL HIGH (ref 0.00–0.07)
Basophils Absolute: 0.1 10*3/uL (ref 0.0–0.1)
Basophils Relative: 0 %
Eosinophils Absolute: 0.1 10*3/uL (ref 0.0–0.5)
Eosinophils Relative: 1 %
Immature Granulocytes: 1 %
Lymphocytes Relative: 41 %
Lymphs Abs: 6 10*3/uL — ABNORMAL HIGH (ref 0.7–4.0)
Monocytes Absolute: 0.9 10*3/uL (ref 0.1–1.0)
Monocytes Relative: 6 %
Neutro Abs: 7.5 10*3/uL (ref 1.7–7.7)
Neutrophils Relative %: 51 %

## 2019-12-27 LAB — CBC
HCT: 43.9 % (ref 36.0–46.0)
Hemoglobin: 14.1 g/dL (ref 12.0–15.0)
MCH: 28.8 pg (ref 26.0–34.0)
MCHC: 32.1 g/dL (ref 30.0–36.0)
MCV: 89.8 fL (ref 80.0–100.0)
Platelets: 357 10*3/uL (ref 150–400)
RBC: 4.89 MIL/uL (ref 3.87–5.11)
RDW: 13.6 % (ref 11.5–15.5)
WBC: 14.8 10*3/uL — ABNORMAL HIGH (ref 4.0–10.5)
nRBC: 0 % (ref 0.0–0.2)

## 2019-12-27 LAB — COMPREHENSIVE METABOLIC PANEL
ALT: 49 U/L — ABNORMAL HIGH (ref 0–44)
AST: 28 U/L (ref 15–41)
Albumin: 3.6 g/dL (ref 3.5–5.0)
Alkaline Phosphatase: 68 U/L (ref 38–126)
Anion gap: 9 (ref 5–15)
BUN: 20 mg/dL (ref 6–20)
CO2: 28 mmol/L (ref 22–32)
Calcium: 8.3 mg/dL — ABNORMAL LOW (ref 8.9–10.3)
Chloride: 103 mmol/L (ref 98–111)
Creatinine, Ser: 0.8 mg/dL (ref 0.44–1.00)
GFR calc Af Amer: >60 mL/min (ref >60)
GFR calc non Af Amer: >60 mL/min (ref >60)
Glucose, Bld: 97 mg/dL (ref 70–99)
Potassium: 3.5 mmol/L (ref 3.5–5.1)
Sodium: 140 mmol/L (ref 135–145)
Total Bilirubin: <0.1 mg/dL — ABNORMAL LOW (ref 0.3–1.2)
Total Protein: 6.5 g/dL (ref 6.5–8.1)

## 2019-12-27 LAB — I-STAT CHEM 8, ED
BUN: 23 mg/dL — ABNORMAL HIGH (ref 6–20)
Calcium, Ion: 1.13 mmol/L — ABNORMAL LOW (ref 1.15–1.40)
Chloride: 100 mmol/L (ref 98–111)
Creatinine, Ser: 0.8 mg/dL (ref 0.44–1.00)
Glucose, Bld: 93 mg/dL (ref 70–99)
HCT: 41 % (ref 36.0–46.0)
Hemoglobin: 13.9 g/dL (ref 12.0–15.0)
Potassium: 3.4 mmol/L — ABNORMAL LOW (ref 3.5–5.1)
Sodium: 141 mmol/L (ref 135–145)
TCO2: 30 mmol/L (ref 22–32)

## 2019-12-27 LAB — PROTIME-INR
INR: 1 (ref 0.8–1.2)
Prothrombin Time: 12.8 seconds (ref 11.4–15.2)

## 2019-12-27 LAB — APTT: aPTT: 26 seconds (ref 24–36)

## 2019-12-27 LAB — I-STAT BETA HCG BLOOD, ED (MC, WL, AP ONLY): I-stat hCG, quantitative: <5 m[IU]/mL (ref <5)

## 2019-12-27 LAB — CBG MONITORING, ED: Glucose-Capillary: 79 mg/dL (ref 70–99)

## 2019-12-27 MED ORDER — SODIUM CHLORIDE 0.9% FLUSH
3.0000 mL | Freq: Once | INTRAVENOUS | Status: AC
  Administered 2019-12-27: 3 mL via INTRAVENOUS

## 2019-12-27 NOTE — ED Provider Notes (Signed)
[place] EMERGENCY DEPARTMENT Provider Note  CSN: [id number] Arrival date & time: 12/27/19 1800    History No chief complaint on file.   HPI  Georgeanne Frankland is a 39 y.o. female presents to the emerged part for evaluation of right sided face and arm numbness.  Patient reports she had an episode of right face and right arm numbness yesterday that resolved.  She reports that symptoms started again 1 hour prior to arrival.  She is also had numbness to her bilateral toes and feet that she attributed to falling and injuring her back last week.  She has no known history of hypertension, diabetes, hyperlipidemia or smoking.  She describes the numbness as "feeling like it fell asleep".  Weakness, headaches, visual changes, slurred speech or facial droop.  Past Medical History:  Diagnosis Date  . Anxiety   . Back pain at L4-L5 level   . Delayed pressure urticaria dx age 14   trauma to skin causes deep tissue  swelling  . GERD (gastroesophageal reflux disease)   . Lower back pain    l4 to l5  . Right thyroid nodule   . TMJ (dislocation of temporomandibular joint)     Past Surgical History:  Procedure Laterality Date  . LUMBAR LAMINECTOMY  2006   L4 and L5 discectomy micro, hemilaminectomy, had conscious sedation for   . THYROID LOBECTOMY Right 02/01/2018   Procedure: RIGHT THYROID LOBECTOMY;  Surgeon: Armandina Gemma, MD;  Location: [facility];  Service: General;  Laterality: Right;  . WISDOM TOOTH EXTRACTION  2001    Family History  Problem Relation Age of Onset  . Diabetes type II Mother   . Heart disease Mother   . Sleep apnea Mother   . Tongue cancer Father   . Glaucoma Father   . Heart disease Father   . Diabetes type II Brother   . Cataracts Brother   . Heart attack Maternal Grandmother   . Heart disease Maternal Grandmother   . Other Maternal Grandfather        worked in [employer]  . Colon cancer Paternal Grandmother   . Diabetes Paternal Grandfather   . Heart disease  Paternal Grandfather   . Allergic rhinitis Neg Hx   . Angioedema Neg Hx   . Asthma Neg Hx   . Eczema Neg Hx   . Immunodeficiency Neg Hx   . Urticaria Neg Hx     Social History   Tobacco Use  . Smoking status: Never Smoker  . Smokeless tobacco: Never Used  Substance Use Topics  . Alcohol use: No  . Drug use: No     Home Medications Prior to Admission medications   Medication Sig Start Date End Date Taking? Authorizing Provider  acetaminophen (TYLENOL) 500 MG tablet Take 1,000 mg by mouth every 6 (six) hours as needed for headache.     [provider]  Ascorbic Acid (VITAMIN C) 1000 MG tablet Take 1,000 mg by mouth at bedtime.    [provider]  cetirizine (ZYRTEC) 10 MG tablet Take 10 mg by mouth at bedtime.     [provider]  cyclobenzaprine (FLEXERIL) 10 MG tablet Take 1 tablet (10 mg total) by mouth at bedtime. 12/22/19   Wurst, [place], PA-C  diphenhydrAMINE (BENADRYL) 2 % cream Apply 1 application topically 3 (three) times daily as needed (for hives.).    [provider]  diphenhydrAMINE (BENADRYL) 25 mg capsule Take 25-50 mg by mouth every 6 (six) hours as needed (  for hives.).     [provider]  meloxicam (MOBIC) 15 MG tablet Take 15 mg by mouth daily as needed (for back/joint pain).    [provider]  mupirocin ointment (BACTROBAN) 2 % Apply 1 application topically 2 (two) times daily. 08/23/19   Wurst, [place], PA-C  omeprazole (PRILOSEC) 40 MG capsule Take 40 mg by mouth 2 (two) times daily.    [provider]  VITAMIN D PO Take 1,000 Units by mouth daily.    [provider]     Allergies    Aspirin, Chlorhexidine, Contrast media [iodinated diagnostic agents], and Nsaids   Review of Systems   Review of Systems  Constitutional: Negative for fever.  HENT: Negative for congestion and sore throat.   Respiratory: Negative for cough and shortness of breath.   Cardiovascular: Negative for chest  pain.  Gastrointestinal: Negative for abdominal pain, diarrhea, nausea and vomiting.  Genitourinary: Negative for dysuria.  Musculoskeletal: Negative for myalgias.  Skin: Negative for rash.  Neurological: Positive for numbness. Negative for headaches.  Psychiatric/Behavioral: Negative for behavioral problems.     Physical Exam BP (!) 101/54   Pulse (!) 103   Resp 18   LMP 12/21/2019   SpO2 99%   Physical Exam Constitutional:      Appearance: Normal appearance.  HENT:     Head: Normocephalic and atraumatic.     Nose: Nose normal.     Mouth/Throat:     Mouth: Mucous membranes are moist.  Eyes:     Extraocular Movements: Extraocular movements intact.     Conjunctiva/sclera: Conjunctivae normal.  Cardiovascular:     Rate and Rhythm: Normal rate.  Pulmonary:     Effort: Pulmonary effort is normal.     Breath sounds: Normal breath sounds.  Abdominal:     General: Abdomen is flat.     Palpations: Abdomen is soft.     Tenderness: There is no abdominal tenderness.  Musculoskeletal:        General: No swelling. Normal range of motion.     Cervical back: Neck supple.  Skin:    General: Skin is warm and dry.  Neurological:     Mental Status: She is alert and oriented to person, place, and time.     Cranial Nerves: No cranial nerve deficit.     Sensory: Sensory deficit (Subjective decreased sensation on the right arm and right face) present.     Motor: No weakness.  Psychiatric:        Mood and Affect: Mood normal.      ED Results / Procedures / Treatments   Labs (all labs ordered are listed, but only abnormal results are displayed) Labs Reviewed  CBC - Abnormal; Notable for the following components:      Result Value   WBC 14.8 (*)    All other components within normal limits  DIFFERENTIAL - Abnormal; Notable for the following components:   Lymphs Abs 6.0 (*)    Abs Immature Granulocytes 0.16 (*)    All other components within normal limits  COMPREHENSIVE  METABOLIC PANEL - Abnormal; Notable for the following components:   Calcium 8.3 (*)    ALT 49 (*)    Total Bilirubin <0.1 (*)    All other components within normal limits  I-STAT CHEM 8, ED - Abnormal; Notable for the following components:   Potassium 3.4 (*)    BUN 23 (*)    Calcium, Ion 1.13 (*)    All other components within  normal limits  PROTIME-INR  APTT  CBG MONITORING, ED  I-STAT BETA HCG BLOOD, ED ([facility], [facility], [facility] ONLY)    EKG None  Radiology MR BRAIN WO CONTRAST  Result Date: 12/27/2019 CLINICAL DATA:  39 year old female with right facial numbness on set 1700 hours. EXAM: MRI HEAD WITHOUT CONTRAST TECHNIQUE: Multiplanar, multiecho pulse sequences of the brain and surrounding structures were obtained without intravenous contrast. COMPARISON:  Head CT earlier today. Brain MRI 06/11/2019. FINDINGS: Brain: No restricted diffusion to suggest acute infarction. No midline shift, mass effect, evidence of mass lesion, ventriculomegaly, extra-axial collection or acute intracranial hemorrhage. Cervicomedullary junction and pituitary are within normal limits. Pearline Cables and Leonetti matter signal is within normal limits throughout the brain. No encephalomalacia or chronic cerebral blood products identified. Vascular: Major intracranial vascular flow voids are stable. Skull and upper cervical spine: Negative visible cervical spine. Visualized bone marrow signal is within normal limits. Sinuses/Orbits: Negative orbits. Normal noncontrast appearance of the cavernous sinuses. Symmetric appearing cisternal 5th nerve segments on series 17, image 15. Paranasal sinuses and mastoids are stable and well pneumatized. Other: Grossly normal visible internal auditory structures. Scalp and face soft tissues appear negative; incidental right side accessory parotid gland tissue (normal variant series 17, image 21). IMPRESSION: Stable and normal noncontrast MRI appearance of the brain. Electronically Signed   By: Genevie Ann M.D.    On: 12/27/2019 19:21   CT HEAD CODE STROKE WO CONTRAST  Result Date: 12/27/2019 CLINICAL DATA:  Code stroke. 39 year old female with right facial numbness on set 1700 hours. EXAM: CT HEAD WITHOUT CONTRAST TECHNIQUE: Contiguous axial images were obtained from the base of the skull through the vertex without intravenous contrast. COMPARISON:  Outside brain MRI 06/11/2019. FINDINGS: Brain: Stable and normal cerebral volume. No midline shift, ventriculomegaly, mass effect, evidence of mass lesion, intracranial hemorrhage or evidence of cortically based acute infarction. Gray-Menees matter differentiation is within normal limits throughout the brain. Vascular: No suspicious intracranial vascular hyperdensity. Skull: Negative. Sinuses/Orbits: Visualized paranasal sinuses and mastoids are clear. Other: Visualized orbits and scalp soft tissues are within normal limits. ASPECTS [facility] Stroke Program Early CT Score) Total score (0-10 with 10 being normal): 10 IMPRESSION: Normal noncontrast CT appearance of the brain.  ASPECTS 10. These results were communicated to Dr. Lorraine Lax at 6:27 pmon 4/9/2021by text page via the [facility] messaging system. Electronically Signed   By: Genevie Ann M.D.   On: 12/27/2019 18:28    Procedures Procedures  Medications Ordered in the ED Medications  sodium chloride flush (NS) 0.9 % injection 3 mL (3 mLs Intravenous Given by Other 12/27/19 1840)     ED Course  I have reviewed the triage vital signs and the nursing notes.  Pertinent labs & imaging results that were available during my care of the patient were reviewed by me and considered in my medical decision making (see chart for details).     MDM Rules/Calculators/A&P MDM Number of Diagnoses or Management Options Diagnosis management comments: Patient with onset of right face and right arm numbness, about 1 hour prior to arrival.  Code stroke was activated from triage and the patient was met in CT scan.  Neurology was at bedside  and do not feel this is a acute stroke in need of TPA.  For full NIH stroke scale please see their documentation.  Neurology recommends an MRI.    Amount and/or Complexity of Data Reviewed Clinical lab tests: ordered and reviewed Tests in the radiology section of CPT: ordered and reviewed Review  and summarize past medical records: yes Independent visualization of images, tracings, or specimens: yes  Risk of Complications, Morbidity, and/or Mortality Presenting problems: high Diagnostic procedures: high Management options: high    Final Clinical Impression(s) / ED Diagnoses Final diagnoses:  Paresthesia    Rx / DC Orders ED Discharge Orders    None       Truddie Hidden, MD 12/27/19 2153

## 2019-12-27 NOTE — ED Notes (Signed)
Pt transported to MRI 

## 2019-12-27 NOTE — ED Triage Notes (Addendum)
1800- Pt arrived via POV.  Reports R sided facial numbness, R sided lips numb and R arm numbness since 5pm.  States she had numbness yesterday with dizziness and symptoms resolved.  Reports she has also had numbness to bilateral toes that she related to a back injury last week.  No arm drift or facial droop. Pt to bridge and Code Stroke activated.  Dr. Regenia Skeeter cleared airway at bridge for CT and labs obtained.

## 2019-12-27 NOTE — ED Notes (Signed)
Patient verbalizes understanding of discharge instructions. Opportunity for questioning and answers were provided. Armband removed by staff, pt discharged from ED ambulatory.   

## 2019-12-27 NOTE — Consult Note (Signed)
Requesting Physician: Dr. Karle Starch    Chief Complaint: Right-sided facial and arm numbness  History obtained from: Patient and Chart     HPI:                                                                                                                                       Christy Alexander is a 39 y.o. female with past medical history of anxiety, back pain presents to the emergency department with numbness and tingling on the right side of her face and right arm that began around 5:30 PM. Code stroke was activated in the emergency department.  Patient states that she had similar symptoms yesterday that lasted for a few hours and completely resolved. She was fine today when at 5:30 PM, she first noticed numbness and tingling of her right arm that gradually involved the right side of her face.  Later she has numbness and tingling and sensation of cold in both feet. Denies any associated headache, blurry vision, floaters.  Denies any slurred speech or weakness.  Describes the numbness as as if her arm was falling asleep.  She has history of headaches usually bitemporal, occasionally has migraine-like headaches that are unilateral and throbbing.  Denies having any episodes similar to this or aura before.  No known stroke risk factors such as hypertension or diabetes.  She has seen neurologist before for chronic low back pain.   Date last known well: 4.921 Time last known well: 5:30 PM. tPA Given: No symptoms mild and low suspicion for stroke NIHSS: 1 Baseline MRS 0  Past Medical History:  Diagnosis Date  . Anxiety   . Back pain at L4-L5 level   . Delayed pressure urticaria dx age 47   trauma to skin causes deep tissue  swelling  . GERD (gastroesophageal reflux disease)   . Lower back pain    l4 to l5  . Right thyroid nodule   . TMJ (dislocation of temporomandibular joint)     Past Surgical History:  Procedure Laterality Date  . LUMBAR LAMINECTOMY  2006   L4 and L5 discectomy micro,  hemilaminectomy, had conscious sedation for   . THYROID LOBECTOMY Right 02/01/2018   Procedure: RIGHT THYROID LOBECTOMY;  Surgeon: Armandina Gemma, MD;  Location: [facility];  Service: General;  Laterality: Right;  . WISDOM TOOTH EXTRACTION  2001    Family History  Problem Relation Age of Onset  . Diabetes type II Mother   . Heart disease Mother   . Sleep apnea Mother   . Tongue cancer Father   . Glaucoma Father   . Heart disease Father   . Diabetes type II Brother   . Cataracts Brother   . Heart attack Maternal Grandmother   . Heart disease Maternal Grandmother   . Other Maternal Grandfather        worked in [employer]  . Colon cancer Paternal Grandmother   .  Diabetes Paternal Grandfather   . Heart disease Paternal Grandfather   . Allergic rhinitis Neg Hx   . Angioedema Neg Hx   . Asthma Neg Hx   . Eczema Neg Hx   . Immunodeficiency Neg Hx   . Urticaria Neg Hx    Social History:  reports that she has never smoked. She has never used smokeless tobacco. She reports that she does not drink alcohol or use drugs.  Allergies:  Allergies  Allergen Reactions  . Aspirin Swelling    hoarseness  . Chlorhexidine Dermatitis  . Contrast Media [Iodinated Diagnostic Agents] Hives and Itching  . Nsaids Other (See Comments)    Eyes swelling/loss of voice    Medications:                                                                                                                        I reviewed home medications   ROS:                                                                                                                                     Systems reviewed and negative except above    Examination:                                                                                                      General: Appears well-developed Psych: Affect appropriate to situation Eyes: No scleral injection HENT: No OP obstrucion Head: Normocephalic.  Cardiovascular: Normal rate and  regular rhythm.  Respiratory: Effort normal and breath sounds normal to anterior ascultation GI: Soft.  No distension. There is no tenderness.  Skin: WDI    Neurological Examination Mental Status: Alert, oriented, thought content appropriate.  Speech fluent without evidence of aphasia. Able to follow 3 step commands without difficulty. Cranial Nerves: II: Visual fields grossly normal,  III,IV, VI: ptosis not present, extra-ocular motions intact bilaterally, pupils equal, round, reactive to light and accommodation V,VII: smile symmetric, facial light touch sensation normal  bilaterally VIII: hearing normal bilaterally IX,X: uvula rises symmetrically XI: bilateral shoulder shrug XII: midline tongue extension Motor: Right : Upper extremity   5/5    Left:     Upper extremity   5/5  Lower extremity   5/5     Lower extremity   5/5 Tone and bulk:normal tone throughout; no atrophy noted Sensory: Reduced sensation of the right side of her face and right arm compared to the left side to light touch. Plantars: Right: downgoing   Left: downgoing Cerebellar: normal finger-to-nose Gait: normal gait and station     Lab Results: Basic Metabolic Panel: Recent Labs  Lab 12/27/19 1811 12/27/19 1819  NA 140 141  K 3.5 3.4*  CL 103 100  CO2 28  --   GLUCOSE 97 93  BUN 20 23*  CREATININE 0.80 0.80  CALCIUM 8.3*  --     CBC: Recent Labs  Lab 12/27/19 1811 12/27/19 1819  WBC 14.8*  --   NEUTROABS 7.5  --   HGB 14.1 13.9  HCT 43.9 41.0  MCV 89.8  --   PLT 357  --     Coagulation Studies: Recent Labs    12/27/19 1811  LABPROT 12.8  INR 1.0    Imaging: CT HEAD CODE STROKE WO CONTRAST  Result Date: 12/27/2019 CLINICAL DATA:  Code stroke. 39 year old female with right facial numbness on set 1700 hours. EXAM: CT HEAD WITHOUT CONTRAST TECHNIQUE: Contiguous axial images were obtained from the base of the skull through the vertex without intravenous contrast. COMPARISON:  Outside  brain MRI 06/11/2019. FINDINGS: Brain: Stable and normal cerebral volume. No midline shift, ventriculomegaly, mass effect, evidence of mass lesion, intracranial hemorrhage or evidence of cortically based acute infarction. Gray-Gipe matter differentiation is within normal limits throughout the brain. Vascular: No suspicious intracranial vascular hyperdensity. Skull: Negative. Sinuses/Orbits: Visualized paranasal sinuses and mastoids are clear. Other: Visualized orbits and scalp soft tissues are within normal limits. ASPECTS [facility] Stroke Program Early CT Score) Total score (0-10 with 10 being normal): 10 IMPRESSION: Normal noncontrast CT appearance of the brain.  ASPECTS 10. These results were communicated to Dr. Lorraine Lax at 6:27 pmon 4/9/2021by text page via the [facility] messaging system. Electronically Signed   By: Genevie Ann M.D.   On: 12/27/2019 18:28     ASSESSMENT AND PLAN   39 year old female with anxiety presents to the emergency department with a right facial numbness/paresthesias as well as right arm paresthesia and bilateral foot paresthesias. Stat CT head was negative for acute findings and a stat MRI was also performed which was negative for acute stroke.  MRI brain also negative for hyper intense signals on flair that would be suggestive for MS.  Impression Right facial numbness and right arm numbness/tingling  Recommendations -Migraine cocktail -Reassured patient this is not a stroke -If this continues to persist/reoccur then recommend outpatient follow-up with neurology    Lorely Bubb [facility] Pager Number [phone]

## 2019-12-31 ENCOUNTER — Other Ambulatory Visit (HOSPITAL_COMMUNITY): Payer: Self-pay | Admitting: Rehabilitation

## 2019-12-31 ENCOUNTER — Other Ambulatory Visit: Payer: Self-pay | Admitting: Rehabilitation

## 2019-12-31 DIAGNOSIS — M545 Low back pain, unspecified: Secondary | ICD-10-CM

## 2020-01-03 ENCOUNTER — Other Ambulatory Visit (HOSPITAL_COMMUNITY): Payer: Self-pay | Admitting: Rehabilitation

## 2020-01-03 DIAGNOSIS — M545 Low back pain, unspecified: Secondary | ICD-10-CM

## 2020-01-07 DIAGNOSIS — M545 Low back pain: Secondary | ICD-10-CM | POA: Diagnosis not present

## 2020-01-13 ENCOUNTER — Ambulatory Visit (HOSPITAL_COMMUNITY): Payer: 59

## 2020-01-13 DIAGNOSIS — M545 Low back pain: Secondary | ICD-10-CM | POA: Diagnosis not present

## 2020-01-13 MED FILL — SAXENDA 18 MG/3 ML PEN: 18 | 30 days supply | Qty: 15 | Fill #0

## 2020-01-13 MED FILL — UNIFINE PENTIPS 6MM 31G: 31G X 6 MM | 90 days supply | Qty: 100 | Fill #0

## 2020-01-20 DIAGNOSIS — M545 Low back pain: Secondary | ICD-10-CM | POA: Diagnosis not present

## 2020-01-22 ENCOUNTER — Ambulatory Visit (HOSPITAL_COMMUNITY)
Admission: RE | Admit: 2020-01-22 | Discharge: 2020-01-22 | Disposition: A | Discharge status: Home / Self Care | Payer: 59 | Source: Ambulatory Visit | Attending: Rehabilitation | Admitting: Rehabilitation

## 2020-01-22 DIAGNOSIS — M545 Low back pain, unspecified: Secondary | ICD-10-CM

## 2020-01-22 DIAGNOSIS — M5126 Other intervertebral disc displacement, lumbar region: Secondary | ICD-10-CM | POA: Diagnosis not present

## 2020-01-22 DIAGNOSIS — M48061 Spinal stenosis, lumbar region without neurogenic claudication: Secondary | ICD-10-CM | POA: Diagnosis not present

## 2020-01-24 DIAGNOSIS — M961 Postlaminectomy syndrome, not elsewhere classified: Secondary | ICD-10-CM | POA: Diagnosis not present

## 2020-01-24 DIAGNOSIS — M7918 Myalgia, other site: Secondary | ICD-10-CM | POA: Diagnosis not present

## 2020-01-24 DIAGNOSIS — M545 Low back pain: Secondary | ICD-10-CM | POA: Diagnosis not present

## 2020-01-27 DIAGNOSIS — M545 Low back pain: Secondary | ICD-10-CM | POA: Diagnosis not present

## 2020-02-18 ENCOUNTER — Ambulatory Visit
Admission: EM | Admit: 2020-02-18 | Discharge: 2020-02-18 | Disposition: A | Discharge status: Home / Self Care | Payer: 59 | Attending: Emergency Medicine | Admitting: Emergency Medicine

## 2020-02-18 ENCOUNTER — Other Ambulatory Visit: Payer: Self-pay

## 2020-02-18 ENCOUNTER — Encounter: Payer: Self-pay | Admitting: Emergency Medicine

## 2020-02-18 DIAGNOSIS — R21 Rash and other nonspecific skin eruption: Secondary | ICD-10-CM

## 2020-02-18 DIAGNOSIS — W57XXXA Bitten or stung by nonvenomous insect and other nonvenomous arthropods, initial encounter: Secondary | ICD-10-CM

## 2020-02-18 MED ORDER — PREDNISONE 10 MG (21) PO TBPK
ORAL_TABLET | ORAL | 0 refills | Status: DC
Start: 2020-02-18 — End: 2020-10-15

## 2020-02-18 MED ORDER — TRIAMCINOLONE ACETONIDE 0.1 % EX CREA
1.0000 | TOPICAL_CREAM | Freq: Two times a day (BID) | CUTANEOUS | 0 refills | Status: DC
Start: 2020-02-18 — End: 2020-02-18

## 2020-02-18 MED ORDER — PREDNISONE 10 MG (21) PO TBPK
ORAL_TABLET | ORAL | 0 refills | Status: DC
Start: 2020-02-18 — End: 2020-02-18

## 2020-02-18 MED ORDER — TRIAMCINOLONE ACETONIDE 0.1 % EX CREA
1.0000 | TOPICAL_CREAM | Freq: Two times a day (BID) | CUTANEOUS | 0 refills | Status: DC
Start: 2020-02-18 — End: 2020-10-15

## 2020-02-18 MED ORDER — METHYLPREDNISOLONE SODIUM SUCC 125 MG IJ SOLR
125.0000 mg | Freq: Once | INTRAMUSCULAR | Status: AC
  Administered 2020-02-18: 125 mg via INTRAMUSCULAR

## 2020-02-18 MED FILL — OMEPRAZOLE 40 MG CPDR: 40 | 30 days supply | Qty: 60 | Fill #0

## 2020-02-18 NOTE — Discharge Instructions (Signed)
Prescribed prednisone and triamcinolone cream Take as prescribed and to completion Limit hot shower and baths, or bathe with warm water.   Moisturize skin daily Follow up with PCP if symptoms persists Return or go to the ER if you have any new or worsening symptoms

## 2020-02-18 NOTE — ED Triage Notes (Signed)
Pt bit by biting midge insect in [place] last Monday Pt has itchy bumps all over her legs, some on her arms, chest and neck.  Has used calamine lotion, benadryl spray, cortisone stick, benadryl 50mg  every 4 hours and alcohol.

## 2020-02-18 NOTE — ED Provider Notes (Signed)
[facility]    CSN: [id number] Arrival date & time: 02/18/20  [place]      History   Chief Complaint Chief Complaint  Patient presents with  . Insect Bite    HPI Christy Alexander is a 39 y.o. female.   Who presented to the urgent care for complaint of rash after insect bite in [place] last Monday.  She denies changes in soaps, detergents, or anyone with similar symptoms.  She localizes the rash to her neck, abdomen and upper arm and lower extremities.  She describes it as itchy red and spreading.  Has tried OTC cortisone without relief.  Her symptoms are made worse at night.denies similar symptoms in the past, denies chills, fever, nausea, vomiting, diarrhea  The history is provided by the patient. No language interpreter was used.    Past Medical History:  Diagnosis Date  . Anxiety   . Back pain at L4-L5 level   . Delayed pressure urticaria dx age 89   trauma to skin causes deep tissue  swelling  . GERD (gastroesophageal reflux disease)   . Lower back pain    l4 to l5  . Right thyroid nodule   . TMJ (dislocation of temporomandibular joint)     Patient Active Problem List   Diagnosis Date Noted  . Double vision 06/04/2019  . Paresthesia 06/04/2019  . Weakness 06/04/2019  . Right thyroid nodule 01/27/2018  . AP (abdominal pain) 03/18/2016  . 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 Drug allergy 01/26/2016  . 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 Allergic rhinitis due to pollen 01/26/2016  . Arthritis 01/26/2016    Past Surgical History:  Procedure Laterality Date  . LUMBAR LAMINECTOMY  2006   L4 and L5 discectomy micro, hemilaminectomy, had conscious sedation for   . THYROID LOBECTOMY Right 02/01/2018   Procedure: RIGHT THYROID LOBECTOMY;  Surgeon: Armandina Gemma, MD;  Location: [facility];  Service: General;  Laterality: Right;  . WISDOM TOOTH EXTRACTION  2001    OB History   No obstetric history on file.      Home Medications    Prior to Admission medications     Medication Sig Start Date End Date Taking? Authorizing Provider  acetaminophen (TYLENOL) 500 MG tablet Take 1,000 mg by mouth every 6 (six) hours as needed for headache.     [provider]  Ascorbic Acid (VITAMIN C) 1000 MG tablet Take 1,000 mg by mouth at bedtime.    [provider]  cetirizine (ZYRTEC) 10 MG tablet Take 10 mg by mouth at bedtime.     [provider]  cyclobenzaprine (FLEXERIL) 10 MG tablet Take 1 tablet (10 mg total) by mouth at bedtime. 12/22/19   Wurst, [place], PA-C  diphenhydrAMINE (BENADRYL) 2 % cream Apply 1 application topically 3 (three) times daily as needed (for hives.).    [provider]  diphenhydrAMINE (BENADRYL) 25 mg capsule Take 25-50 mg by mouth every 6 (six) hours as needed (for hives.).     [provider]  meloxicam (MOBIC) 15 MG tablet Take 15 mg by mouth daily as needed (for back/joint pain).    [provider]  mupirocin ointment (BACTROBAN) 2 % Apply 1 application topically 2 (two) times daily. 08/23/19   Wurst, [place], PA-C  omeprazole (PRILOSEC) 40 MG capsule Take 40 mg by mouth 2 (two) times daily.    [provider]  predniSONE (STERAPRED UNI-PAK 21 TAB) 10 MG (21) TBPK tablet Take 6 tabs by mouth daily  for 1 days,  then 5 tabs for 1 days, then 4 tabs for 1 days, then 3 tabs for 1 days, 2 tabs for 1 days, then 1 tab by mouth daily for 1 days 02/18/20   Emerson Monte, FNP  triamcinolone cream (KENALOG) 0.1 % Apply 1 application topically 2 (two) times daily. 02/18/20   Saed Hudlow, Darrelyn Hillock, FNP  VITAMIN D PO Take 1,000 Units by mouth daily.    [provider]    Family History Family History  Problem Relation Age of Onset  . Diabetes type II Mother   . Heart disease Mother   . Sleep apnea Mother   . Tongue cancer Father   . Glaucoma Father   . Heart disease Father   . Diabetes type II Brother   . Cataracts Brother   . Heart attack Maternal Grandmother   . Heart disease  Maternal Grandmother   . Other Maternal Grandfather        worked in [employer]  . Colon cancer Paternal Grandmother   . Diabetes Paternal Grandfather   . Heart disease Paternal Grandfather   . Allergic rhinitis Neg Hx   . Angioedema Neg Hx   . Asthma Neg Hx   . Eczema Neg Hx   . Immunodeficiency Neg Hx   . Urticaria Neg Hx     Social History Social History   Tobacco Use  . Smoking status: Never Smoker  . Smokeless tobacco: Never Used  Substance Use Topics  . Alcohol use: No  . Drug use: No     Allergies   Aspirin, Chlorhexidine, Contrast media [iodinated diagnostic agents], and Nsaids   Review of Systems Review of Systems  Constitutional: Negative.   Respiratory: Negative.   Cardiovascular: Negative.   Skin: Positive for color change and rash.  All other systems reviewed and are negative.    Physical Exam Triage Vital Signs ED Triage Vitals  Enc Vitals Group     BP 02/18/20 1912 (!) 127/94     Pulse Rate 02/18/20 1912 92     Resp 02/18/20 1912 17     Temp 02/18/20 1912 98.8 F (37.1 C)     Temp Source 02/18/20 1912 Oral     SpO2 02/18/20 1912 98 %     Weight 02/18/20 1911 240 lb (108.9 kg)     Height 02/18/20 1911 5\' 4"  (1.626 m)     Head Circumference --      Peak Flow --      Pain Score 02/18/20 1910 0     Pain Loc --      Pain Edu? --      Excl. in [place]? --    No data found.  Updated Vital Signs BP (!) 127/94 (BP Location: Right Wrist)   Pulse 92   Temp 98.8 F (37.1 C) (Oral)   Resp 17   Ht 5\' 4"  (1.626 m)   Wt 240 lb (108.9 kg)   LMP 02/10/2020   SpO2 98%   BMI 41.20 kg/m   Visual Acuity Right Eye Distance:   Left Eye Distance:   Bilateral Distance:    Right Eye Near:   Left Eye Near:    Bilateral Near:     Physical Exam Vitals reviewed.  Constitutional:      General: She is not in acute distress.    Appearance: Normal appearance. She is normal weight. She is not ill-appearing, toxic-appearing or diaphoretic.  Cardiovascular:      Rate and Rhythm: Normal rate and regular  rhythm.     Pulses: Normal pulses.     Heart sounds: Normal heart sounds. No murmur. No friction rub. No gallop.   Pulmonary:     Effort: Pulmonary effort is normal. No respiratory distress.     Breath sounds: Normal breath sounds. No stridor. No wheezing, rhonchi or rales.  Chest:     Chest wall: No tenderness.  Skin:    General: Skin is warm.     Findings: Erythema and rash present. Rash is macular.  Neurological:     Mental Status: She is alert.      UC Treatments / Results  Labs (all labs ordered are listed, but only abnormal results are displayed) Labs Reviewed - No data to display  EKG   Radiology No results found.  Procedures Procedures (including critical care time)  Medications Ordered in UC Medications  methylPREDNISolone sodium succinate (SOLU-MEDROL) 125 mg/2 mL injection 125 mg (has no administration in time range)    Initial Impression / Assessment and Plan / UC Course  I have reviewed the triage vital signs and the nursing notes.  Pertinent labs & imaging results that were available during my care of the patient were reviewed by me and considered in my medical decision making (see chart for details).    Patient is stable for discharge.  Solu-Medrol IM were given in office.  Prednisone and triamcinolone were prescribed.  Was advised to continue to take Zyrtec as directed  Final Clinical Impressions(s) / UC Diagnoses   Final diagnoses:  Rash  Insect bite, unspecified site, initial encounter     Discharge Instructions     Prescribed prednisone and triamcinolone cream Take as prescribed and to completion Limit hot shower and baths, or bathe with warm water.   Moisturize skin daily Follow up with PCP if symptoms persists Return or go to the ER if you have any new or worsening symptoms     ED Prescriptions    Medication Sig Dispense Auth. Provider   predniSONE (STERAPRED UNI-PAK 21 TAB) 10 MG (21)  TBPK tablet Take 6 tabs by mouth daily  for 1 days, then 5 tabs for 1 days, then 4 tabs for 1 days, then 3 tabs for 1 days, 2 tabs for 1 days, then 1 tab by mouth daily for 1 days 21 tablet Randolf Sansoucie, Darrelyn Hillock, FNP   triamcinolone cream (KENALOG) 0.1 % Apply 1 application topically 2 (two) times daily. 30 g Emerson Monte, FNP     PDMP not reviewed this encounter.   Emerson Monte, FNP 02/18/20 1944

## 2020-03-11 DIAGNOSIS — H5213 Myopia, bilateral: Secondary | ICD-10-CM | POA: Diagnosis not present

## 2020-03-11 DIAGNOSIS — H52223 Regular astigmatism, bilateral: Secondary | ICD-10-CM | POA: Diagnosis not present

## 2020-03-12 DIAGNOSIS — M545 Low back pain: Secondary | ICD-10-CM | POA: Diagnosis not present

## 2020-03-12 DIAGNOSIS — M961 Postlaminectomy syndrome, not elsewhere classified: Secondary | ICD-10-CM | POA: Diagnosis not present

## 2020-03-12 DIAGNOSIS — M5116 Intervertebral disc disorders with radiculopathy, lumbar region: Secondary | ICD-10-CM | POA: Diagnosis not present

## 2020-03-12 DIAGNOSIS — M7918 Myalgia, other site: Secondary | ICD-10-CM | POA: Diagnosis not present

## 2020-03-22 MED FILL — OMEPRAZOLE 40 MG CPDR: 40 | 30 days supply | Qty: 60 | Fill #1

## 2020-05-19 MED FILL — OMEPRAZOLE 40 MG CPDR: 40 | 30 days supply | Qty: 60 | Fill #3

## 2020-05-27 DIAGNOSIS — K219 Gastro-esophageal reflux disease without esophagitis: Secondary | ICD-10-CM | POA: Diagnosis not present

## 2020-05-27 DIAGNOSIS — K5900 Constipation, unspecified: Secondary | ICD-10-CM | POA: Diagnosis not present

## 2020-05-27 DIAGNOSIS — R1310 Dysphagia, unspecified: Secondary | ICD-10-CM | POA: Diagnosis not present

## 2020-06-01 DIAGNOSIS — E282 Polycystic ovarian syndrome: Secondary | ICD-10-CM | POA: Diagnosis not present

## 2020-06-01 DIAGNOSIS — Z6841 Body Mass Index (BMI) 40.0 and over, adult: Secondary | ICD-10-CM | POA: Diagnosis not present

## 2020-06-01 DIAGNOSIS — L680 Hirsutism: Secondary | ICD-10-CM | POA: Diagnosis not present

## 2020-06-01 DIAGNOSIS — Z01419 Encounter for gynecological examination (general) (routine) without abnormal findings: Secondary | ICD-10-CM | POA: Diagnosis not present

## 2020-06-08 DIAGNOSIS — F419 Anxiety disorder, unspecified: Secondary | ICD-10-CM | POA: Diagnosis not present

## 2020-06-08 DIAGNOSIS — E785 Hyperlipidemia, unspecified: Secondary | ICD-10-CM | POA: Diagnosis not present

## 2020-06-08 DIAGNOSIS — M2550 Pain in unspecified joint: Secondary | ICD-10-CM | POA: Diagnosis not present

## 2020-06-08 DIAGNOSIS — Z6841 Body Mass Index (BMI) 40.0 and over, adult: Secondary | ICD-10-CM | POA: Diagnosis not present

## 2020-06-08 DIAGNOSIS — K5909 Other constipation: Secondary | ICD-10-CM | POA: Diagnosis not present

## 2020-06-12 ENCOUNTER — Other Ambulatory Visit (HOSPITAL_COMMUNITY): Payer: Self-pay | Admitting: Obstetrics and Gynecology

## 2020-06-12 DIAGNOSIS — N859 Noninflammatory disorder of uterus, unspecified: Secondary | ICD-10-CM | POA: Diagnosis not present

## 2020-06-12 DIAGNOSIS — E282 Polycystic ovarian syndrome: Secondary | ICD-10-CM | POA: Diagnosis not present

## 2020-06-12 MED FILL — metFORMIN HCL ER 500 MG TB2: 500 | 90 days supply | Qty: 90 | Fill #0

## 2020-06-12 MED FILL — NORETHIN ACE-ETH ESTRAD-FE: 1-20 | 84 days supply | Qty: 84 | Fill #0

## 2020-06-15 MED FILL — OMEPRAZOLE 40 MG CPDR: 40 | 30 days supply | Qty: 60 | Fill #4

## 2020-06-16 DIAGNOSIS — R1310 Dysphagia, unspecified: Secondary | ICD-10-CM | POA: Diagnosis not present

## 2020-06-17 ENCOUNTER — Other Ambulatory Visit: Payer: Self-pay | Admitting: Gastroenterology

## 2020-06-17 ENCOUNTER — Other Ambulatory Visit (HOSPITAL_COMMUNITY): Payer: Self-pay | Admitting: Gastroenterology

## 2020-06-17 DIAGNOSIS — R110 Nausea: Secondary | ICD-10-CM

## 2020-07-08 ENCOUNTER — Other Ambulatory Visit: Payer: Self-pay

## 2020-07-08 ENCOUNTER — Encounter (HOSPITAL_COMMUNITY)
Admission: RE | Admit: 2020-07-08 | Discharge: 2020-07-08 | Disposition: A | Discharge status: Home / Self Care | Payer: 59 | Source: Ambulatory Visit | Attending: Gastroenterology | Admitting: Gastroenterology

## 2020-07-08 DIAGNOSIS — K30 Functional dyspepsia: Secondary | ICD-10-CM | POA: Diagnosis not present

## 2020-07-08 DIAGNOSIS — K3189 Other diseases of stomach and duodenum: Secondary | ICD-10-CM | POA: Diagnosis not present

## 2020-07-08 DIAGNOSIS — R110 Nausea: Secondary | ICD-10-CM | POA: Insufficient documentation

## 2020-07-08 DIAGNOSIS — K219 Gastro-esophageal reflux disease without esophagitis: Secondary | ICD-10-CM | POA: Diagnosis not present

## 2020-07-08 MED ORDER — TECHNETIUM TC 99M SULFUR COLLOID
2.0000 | Freq: Once | INTRAVENOUS | Status: AC | PRN
  Administered 2020-07-08: 2 via INTRAVENOUS

## 2020-07-21 ENCOUNTER — Ambulatory Visit: Payer: 59 | Admitting: Podiatry

## 2020-07-21 ENCOUNTER — Encounter: Payer: Self-pay | Admitting: Podiatry

## 2020-07-21 ENCOUNTER — Other Ambulatory Visit: Payer: Self-pay

## 2020-07-21 DIAGNOSIS — N3941 Urge incontinence: Secondary | ICD-10-CM | POA: Insufficient documentation

## 2020-07-21 DIAGNOSIS — L603 Nail dystrophy: Secondary | ICD-10-CM | POA: Diagnosis not present

## 2020-07-21 DIAGNOSIS — R3129 Other microscopic hematuria: Secondary | ICD-10-CM | POA: Insufficient documentation

## 2020-07-21 DIAGNOSIS — L608 Other nail disorders: Secondary | ICD-10-CM | POA: Diagnosis not present

## 2020-07-21 NOTE — Progress Notes (Signed)
Subjective:  Patient ID: Christy Alexander, female    DOB: 1981-06-11,  MRN: [id number] HPI Chief Complaint  Patient presents with  . Nail Problem    3rd toenail left - whitish discoloration medial corner/tip of the nail x 1 month, tried OTC meds  . New Patient (Initial Visit)    39 y.o. female presents with the above complaint.   ROS: Denies fever chills nausea vomiting muscle aches pains calf pain back pain chest pain shortness of breath.  Past Medical History:  Diagnosis Date  . Anxiety   . Back pain at L4-L5 level   . Delayed pressure urticaria dx age 89   trauma to skin causes deep tissue  swelling  . GERD (gastroesophageal reflux disease)   . Lower back pain    l4 to l5  . Right thyroid nodule   . TMJ (dislocation of temporomandibular joint)    Past Surgical History:  Procedure Laterality Date  . LUMBAR LAMINECTOMY  2006   L4 and L5 discectomy micro, hemilaminectomy, had conscious sedation for   . THYROID LOBECTOMY Right 02/01/2018   Procedure: RIGHT THYROID LOBECTOMY;  Surgeon: Armandina Gemma, MD;  Location: [facility];  Service: General;  Laterality: Right;  . WISDOM TOOTH EXTRACTION  2001    Current Outpatient Medications:  .  esomeprazole (NEXIUM) 40 MG capsule, Take by mouth., Disp: , Rfl:  .  acetaminophen (TYLENOL) 500 MG tablet, Take 1,000 mg by mouth every 6 (six) hours as needed for headache. , Disp: , Rfl:  .  Ascorbic Acid (VITAMIN C) 1000 MG tablet, Take 1,000 mg by mouth at bedtime., Disp: , Rfl:  .  cetirizine (ZYRTEC) 10 MG tablet, Take 10 mg by mouth at bedtime. , Disp: , Rfl:  .  cyclobenzaprine (FLEXERIL) 10 MG tablet, Take 1 tablet (10 mg total) by mouth at bedtime., Disp: 20 tablet, Rfl: 0 .  diphenhydrAMINE (BENADRYL) 2 % cream, Apply 1 application topically 3 (three) times daily as needed (for hives.)., Disp: , Rfl:  .  diphenhydrAMINE (BENADRYL) 25 mg capsule, Take 25-50 mg by mouth every 6 (six) hours as needed (for hives.). , Disp: , Rfl:  .  ID NOW  COVID-19 KIT, See admin instructions., Disp: , Rfl:  .  Liraglutide -Weight Management (SAXENDA) 18 MG/3ML SOPN, Saxenda 3 mg/0.5 mL (18 mg/3 mL) subcutaneous pen injector, Disp: , Rfl:  .  meloxicam (MOBIC) 15 MG tablet, Take 15 mg by mouth daily as needed (for back/joint pain)., Disp: , Rfl:  .  metFORMIN (GLUCOPHAGE-XR) 500 MG 24 hr tablet, Take 500 mg by mouth daily., Disp: , Rfl:  .  mupirocin ointment (BACTROBAN) 2 %, Apply 1 application topically 2 (two) times daily., Disp: 30 g, Rfl: 0 .  Norethin Ace-Eth Estrad-FE 1-20 MG-MCG(24) CHEW, Chew 1 tablet by mouth daily., Disp: , Rfl:  .  predniSONE (STERAPRED UNI-PAK 21 TAB) 10 MG (21) TBPK tablet, Take 6 tabs by mouth daily  for 1 days, then 5 tabs for 1 days, then 4 tabs for 1 days, then 3 tabs for 1 days, 2 tabs for 1 days, then 1 tab by mouth daily for 1 days, Disp: 21 tablet, Rfl: 0 .  triamcinolone cream (KENALOG) 0.1 %, Apply 1 application topically 2 (two) times daily., Disp: 30 g, Rfl: 0 .  VITAMIN D PO, Take 1,000 Units by mouth daily., Disp: , Rfl:   Allergies  Allergen Reactions  . Aspirin Swelling    hoarseness  . Chlorhexidine Dermatitis  . Contrast Media [  Iodinated Diagnostic Agents] Hives and Itching  . Nsaids Other (See Comments)    Eyes swelling/loss of voice   Review of Systems Objective:  There were no vitals filed for this visit.  General: Well developed, nourished, in no acute distress, alert and oriented x3   Dermatological: Skin is warm, dry and supple bilateral. Nails x 10 are well maintained; remaining integument appears unremarkable at this time. There are no open sores, no preulcerative lesions, no rash or signs of infection present.  She has what appears to be a superficial Camey onychomycosis to the medial portion of the nail overhanging third toe of the left foot.  There is also a very small thin melanotic streak in the toenail.  She also has a small nevus to the distal aspect of the right hallux it is  not raised it is uniform in color and the margins appear to be normal.  Vascular: Dorsalis Pedis artery and Posterior Tibial artery pedal pulses are 2/4 bilateral with immedate capillary fill time. Pedal hair growth present. No varicosities and no lower extremity edema present bilateral.   Neruologic: Grossly intact via light touch bilateral. Vibratory intact via tuning fork bilateral. Protective threshold with Semmes Wienstein monofilament intact to all pedal sites bilateral. Patellar and Achilles deep tendon reflexes 2+ bilateral. No Babinski or clonus noted bilateral.   Musculoskeletal: No gross boney pedal deformities bilateral. No pain, crepitus, or limitation noted with foot and ankle range of motion bilateral. Muscular strength 5/5 in all groups tested bilateral.  Gait: Unassisted, Nonantalgic.    Radiographs:  None taken  Assessment & Plan:   Assessment: Nail dystrophy cannot rule out onychomycosis third toe left foot.  Nevus hallux right foot  Plan: Because she is fair in color skin and hair I recommended that she watch any new nevus carefully.  Samples of the skin and nail of the toenail third left were taken for pathologic evaluation I will follow-up with her once this comes in 1 month.     Alyssabeth Bruster T. [place], [place]

## 2020-07-27 MED FILL — OMEPRAZOLE 40 MG CPDR: 40 | 30 days supply | Qty: 60 | Fill #5

## 2020-08-20 ENCOUNTER — Ambulatory Visit: Payer: 59 | Admitting: Podiatry

## 2020-08-24 ENCOUNTER — Telehealth: Payer: Self-pay | Admitting: *Deleted

## 2020-08-24 NOTE — Telephone Encounter (Signed)
Left message patient results were negative

## 2020-08-24 NOTE — Telephone Encounter (Signed)
-----   Message from Garrel Ridgel, [place] sent at 08/10/2020  7:00 AM EST ----- Negative for fungus.  Nail trauma most likely.

## 2020-08-31 MED FILL — OMEPRAZOLE 40 MG CPDR: 40 | 30 days supply | Qty: 60 | Fill #0

## 2020-08-31 MED FILL — metFORMIN HCL ER 500 MG TB2: 500 | 90 days supply | Qty: 90 | Fill #1

## 2020-08-31 MED FILL — NORETHIN ACE-ETH ESTRAD-FE: 1-20 | 84 days supply | Qty: 84 | Fill #1

## 2020-09-01 ENCOUNTER — Other Ambulatory Visit (HOSPITAL_COMMUNITY): Payer: Self-pay | Admitting: Otolaryngology

## 2020-09-24 MED FILL — OMEPRAZOLE 40 MG CPDR: 40 | 30 days supply | Qty: 60 | Fill #0

## 2020-10-01 ENCOUNTER — Other Ambulatory Visit (HOSPITAL_COMMUNITY): Payer: Self-pay | Admitting: Obstetrics and Gynecology

## 2020-10-01 DIAGNOSIS — N939 Abnormal uterine and vaginal bleeding, unspecified: Secondary | ICD-10-CM | POA: Diagnosis not present

## 2020-10-01 MED FILL — BRIELLYN TABLET: 0.4-35 | 84 days supply | Qty: 84 | Fill #0

## 2020-10-01 NOTE — Progress Notes (Signed)
Office Visit Note  Patient: Christy Alexander             Date of Birth: [birth year]           MRN: [id number]             PCP: Clinton Quant, MD Referring: Dannial Monarch, FNP Visit Date: 10/15/2020 Occupation: _0 @  Subjective:  Elevated sedimentation rate.   History of Present Illness: Christy Alexander is a 40 y.o. female seen in consultation per request of her PCP for evaluation of elevated sedimentation rate.  Patient states that many years ago she was found to have elevated C-reactive protein and CRP.  At the time she was seen by rheumatologist in [place] who asked her if she had headaches.  Patient complained of headaches and some double vision which was temporary and resolved.  According to patient she was placed on high-dose prednisone which she took for about 6 months and then went for a second opinion at [facility] where she was seen by rheumatologist who took her off the prednisone and did not agree with the diagnosis.  She states her sed rate was tested several times over the years and stayed elevated.  She has longstanding history of recurrent urticaria for which she had seen an allergist and done well.  She has been tested for multiple allergies in the past.  She states she gets hives which come and go and they have become less frequent since she has been taking Zyrtec on a daily basis.  She states previously every time she will get hives she will have swollen hands and feet and in the hives will resolve the swelling would resolve.  She also gives history of pressure induced urticaria.  She denies any joint pain except when she has swelling.  There is no history of oral ulcers, nasal ulcers, malar rash, photosensitivity, Raynaud's phenomenon or lymphadenopathy.  She gives history of dry mouth, dry eyes and dry skin.  There is no family history of autoimmune disease.  She is gravida 0 para 0.  Activities of Daily Living:  Patient reports morning stiffness for 5 minutes.   Patient Denies  nocturnal pain.  Difficulty dressing/grooming: Denies Difficulty climbing stairs: Denies Difficulty getting out of chair: Denies Difficulty using hands for taps, buttons, cutlery, and/or writing: Denies  Review of Systems  Constitutional: Negative for fatigue, night sweats, weight gain and weight loss.  HENT: Positive for mouth dryness and nose dryness. Negative for mouth sores, trouble swallowing and trouble swallowing.   Eyes: Positive for visual disturbance and dryness. Negative for pain, redness and itching.  Respiratory: Negative for cough, hemoptysis, shortness of breath and difficulty breathing.   Cardiovascular: Negative for chest pain, palpitations, hypertension, irregular heartbeat and swelling in legs/feet.  Gastrointestinal: Positive for constipation. Negative for abdominal pain, blood in stool and diarrhea.  Endocrine: Negative for increased urination.  Genitourinary: Negative for painful urination and vaginal dryness.  Musculoskeletal: Positive for arthralgias, joint pain, myalgias, morning stiffness and myalgias. Negative for joint swelling, muscle weakness and muscle tenderness.       Lower back  Skin: Positive for rash. Negative for color change, hair loss, redness, skin tightness, ulcers and sensitivity to sunlight.       urticaria  Allergic/Immunologic: Negative for susceptible to infections.  Neurological: Negative for dizziness, numbness, headaches, memory loss, night sweats and weakness.  Hematological: Negative for swollen glands.  Psychiatric/Behavioral: Positive for sleep disturbance. Negative for depressed mood and confusion. The patient is nervous/anxious.  PMFS History:  Patient Active Problem List   Diagnosis Date Noted  . Microhematuria 07/21/2020  . Urge incontinence 07/21/2020  . Referred otalgia of both ears 10/31/2019  . Throat discomfort 10/31/2019  . Double vision 06/04/2019  . Paresthesia 06/04/2019  . Weakness 06/04/2019  . Hyperglycemia  03/22/2019  . Dysphonia 07/03/2018  . H/O partial thyroidectomy 07/03/2018  . Vitamin D deficiency 06/26/2018  . Right thyroid nodule 01/27/2018  . Multiple joint pain 05/29/2017  . Hyperlipidemia 11/21/2016  . Elevated C-reactive protein 06/30/2016  . Laryngopharyngeal reflux (LPR) 05/10/2016  . Obesity with body mass index 30 or greater 05/10/2016  . AP (abdominal pain) 03/18/2016  . 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 Chronic constipation 03/08/2016  . Fissure in skin 03/08/2016  . 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  . Drug allergy 01/26/2016  . 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 Allergic rhinitis due to pollen 01/26/2016  . Arthritis 01/26/2016  . Anxiety 11/10/2015  . Lumbosacral disc disease 02/27/2015    Past Medical History:  Diagnosis Date  . Anxiety   . Back pain at L4-L5 level   . Delayed pressure urticaria dx age 38   trauma to skin causes deep tissue  swelling  . GERD (gastroesophageal reflux disease)   . Lower back pain    l4 to l5  . Right thyroid nodule   . TMJ (dislocation of temporomandibular joint)     Family History  Problem Relation Age of Onset  . Diabetes type II Mother   . Heart disease Mother   . Sleep apnea Mother   . Tongue cancer Father   . Glaucoma Father   . Heart disease Father   . Diabetes type II Brother   . Cataracts Brother   . Heart attack Maternal Grandmother   . Heart disease Maternal Grandmother   . Other Maternal Grandfather        worked in [employer]  . Colon cancer Paternal Grandmother   . Diabetes Paternal Grandfather   . Heart disease Paternal Grandfather   . Allergic rhinitis Neg Hx   . Angioedema Neg Hx   . Asthma Neg Hx   . Eczema Neg Hx   . Immunodeficiency Neg Hx   . Urticaria Neg Hx    Past Surgical History:  Procedure Laterality Date  . LUMBAR LAMINECTOMY  2006   L4 and L5 discectomy micro, hemilaminectomy, had conscious sedation for   . THYROID LOBECTOMY Right 02/01/2018   Procedure: RIGHT THYROID LOBECTOMY;   Surgeon: Armandina Gemma, MD;  Location: [facility];  Service: General;  Laterality: Right;  . [place] EXTRACTION  2001   Social History   Social History Narrative   Lives at home with her husband.   Right-handed.   1 cup coffee per day.   Immunization History  Administered Date(s) Administered  . Influenza,inj,quad, With Preservative 07/10/2018, 07/01/2019  . Influenza-Unspecified 06/24/2015, 06/23/2016, 06/26/2017, 07/09/2018, 07/01/2019, 06/30/2020  . Moderna Sars-Covid-2 Vaccination 11/06/2019  . PFIZER(Purple Top)SARS-COV-2 Vaccination 11/06/2019, 11/28/2019, 09/08/2020  . Td 02/04/2015  . Tdap 02/04/2015  . Tetanus 02/04/2015     Objective: Vital Signs: BP 109/79 (BP Location: Right Arm, Patient Position: Sitting, Cuff Size: Normal)   Pulse 90   Ht 5' 4.25" (1.632 m)   Wt 227 lb 6.4 oz (103.1 kg)   LMP 09/28/2020   BMI 38.73 kg/m    Physical Exam Vitals and nursing note reviewed.  Constitutional:      Appearance: She is well-developed and well-nourished.  HENT:  Head: Normocephalic and atraumatic.  Eyes:     Extraocular Movements: EOM normal.     Conjunctiva/sclera: Conjunctivae normal.  Cardiovascular:     Rate and Rhythm: Normal rate and regular rhythm.     Pulses: Intact distal pulses.     Heart sounds: Normal heart sounds.  Pulmonary:     Effort: Pulmonary effort is normal.     Breath sounds: Normal breath sounds.  Abdominal:     General: Bowel sounds are normal.     Palpations: Abdomen is soft.  Musculoskeletal:     Cervical back: Normal range of motion.  Lymphadenopathy:     Cervical: No cervical adenopathy.  Skin:    General: Skin is warm and dry.     Capillary Refill: Capillary refill takes less than 2 seconds.  Neurological:     Mental Status: She is alert and oriented to person, place, and time.  Psychiatric:        Mood and Affect: Mood and affect normal.        Behavior: Behavior normal.      Musculoskeletal Exam: C-spine, thoracic  and lumbar spine with good range of motion.  Shoulder joints, elbow joints, wrist joints, MCPs PIPs and DIPs with good range of motion with no synovitis.  Hip joints, knee joints, ankles, MTPs and PIPs with good range of motion with no synovitis.  CDAI Exam: CDAI Score: -- Patient Global: --; Provider Global: -- Swollen: --; Tender: -- Joint Exam 10/15/2020   No joint exam has been documented for this visit   There is currently no information documented on the homunculus. Go to the Rheumatology activity and complete the homunculus joint exam.  Investigation: No additional findings.  Imaging: XR Foot 2 Views Left  Result Date: 10/15/2020 First MTP, PIP and DIP narrowing was noted.  Dorsal spurring was noted.  No intertarsal, tibiotalar or subtalar joint space narrowing was noted.  Inferior and posterior calcaneal spurs were noted. Impression: These findings are consistent with osteoarthritis of the foot.  XR Foot 2 Views Right  Result Date: 10/15/2020 First MTP, PIP and DIP narrowing was noted.  No intertarsal, tibiotalar or subtalar joint space narrowing was noted.  No erosive changes were noted.  Posterior calcaneal spur was noted. Impression: These findings are consistent with osteoarthritis of the foot.  XR Hand 2 View Left  Result Date: 10/15/2020 Mild CMC, PIP and DIP narrowing was noted.  No MCP, intercarpal or radiocarpal joint space narrowing was noted.  No erosive changes were noted. Impression: These findings are consistent with early osteoarthritis.  XR Hand 2 View Right  Result Date: 10/15/2020 Mild PIP and DIP narrowing was noted.  No MCP, intercarpal or radiocarpal joint space narrowing was noted.  No erosive changes were noted. Impression: These findings are consistent with early osteoarthritis.  VAS [place] LOWER EXTREMITY VENOUS (DVT)  Result Date: 10/13/2020  Lower Venous DVT Study Indications: Edema, Swelling, and Pain. Other Indications: Patient complains of several  days of left posterolateral                    proximal calf pain and edema. The pain resolved today.                    Maternal history of varicose veins. Performing Technologist: Delorise Shiner RVT  Examination Guidelines: A complete evaluation includes B-mode imaging, spectral Doppler, color Doppler, and power Doppler as needed of all accessible portions of each vessel. Bilateral testing is considered an  integral part of a complete examination. Limited examinations for reoccurring indications may be performed as noted. The reflux portion of the exam is performed with the patient in reverse Trendelenburg.  +-----+---------------+---------+-----------+----------+--------------+ RIGHTCompressibilityPhasicitySpontaneityPropertiesThrombus Aging +-----+---------------+---------+-----------+----------+--------------+ CFV  Full           Yes      Yes                                 +-----+---------------+---------+-----------+----------+--------------+   +---------+---------------+---------+-----------+----------+--------------+ LEFT     CompressibilityPhasicitySpontaneityPropertiesThrombus Aging +---------+---------------+---------+-----------+----------+--------------+ CFV      Full           Yes      Yes                                 +---------+---------------+---------+-----------+----------+--------------+ SFJ      Full                    Yes                                 +---------+---------------+---------+-----------+----------+--------------+ FV Prox  Full           Yes      Yes                                 +---------+---------------+---------+-----------+----------+--------------+ FV Mid   Full           Yes      Yes                                 +---------+---------------+---------+-----------+----------+--------------+ FV DistalFull           Yes      Yes                                  +---------+---------------+---------+-----------+----------+--------------+ PFV      Full                    Yes                                 +---------+---------------+---------+-----------+----------+--------------+ POP      Full           Yes      Yes                                 +---------+---------------+---------+-----------+----------+--------------+ PTV      Full                                                        +---------+---------------+---------+-----------+----------+--------------+ PERO     Full                                                        +---------+---------------+---------+-----------+----------+--------------+  GSV      Full                    Yes                                 +---------+---------------+---------+-----------+----------+--------------+ SSV      Full                    Yes                                 +---------+---------------+---------+-----------+----------+--------------+ Reflux identified in the thigh segment of the great saphenous vein.   Findings reported to voicemail for Johny Shears at 13:34.  Summary: RIGHT: - No evidence of deep vein thrombosis in the lower extremity. No indirect evidence of obstruction proximal to the inguinal ligament.  LEFT: - There is no evidence of deep vein thrombosis in the lower extremity. - There is no evidence of superficial venous thrombosis.  - No cystic structure found in the popliteal fossa.  *See table(s) above for measurements and observations. Electronically signed by Monica Martinez MD on 10/13/2020 at 4:51:47 PM.    Final     Recent Labs: Lab Results  Component Value Date   WBC 14.8 (H) 12/27/2019   HGB 13.9 12/27/2019   PLT 357 12/27/2019   NA 141 12/27/2019   K 3.4 (L) 12/27/2019   CL 100 12/27/2019   CO2 28 12/27/2019   GLUCOSE 93 12/27/2019   BUN 23 (H) 12/27/2019   CREATININE 0.80 12/27/2019   BILITOT <0.1 (L) 12/27/2019   ALKPHOS 68 12/27/2019    AST 28 12/27/2019   ALT 49 (H) 12/27/2019   PROT 6.5 12/27/2019   ALBUMIN 3.6 12/27/2019   CALCIUM 8.3 (L) 12/27/2019   GFRAA >60 12/27/2019    Speciality Comments: No specialty comments available.  Procedures:  No procedures performed Allergies: Aspirin, Chlorhexidine, Contrast media [iodinated diagnostic agents], Esomeprazole, and Nsaids   Assessment / Plan:     Visit Diagnoses: Elevated sed rate - 06/08/20:ANA negative, CRP 2.4, ESR 55 -patient has elevated sedimentation rate and elevated C-reactive protein for many years.  She also gives history of recurrent urticaria.  I will obtain following labs today.  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  I will obtain following labs today.  Plan: Sjogrens syndrome-A extractable nuclear antibody, Sjogrens syndrome-B extractable nuclear antibody, Serum protein electrophoresis with reflex, IgG, IgA, IgM  Pain in both hands -she has some stiffness in her hands.  No synovitis was noted.  Plan: XR Hand 2 View Right, XR Hand 2 View Left, x-rays obtained today showed early osteoarthritic changes.  Rheumatoid factor, Cyclic citrul peptide antibody, IgG  Pain in both feet -she has off-and-on stiffness in her feet.  No synovitis was noted.  Plan: XR Foot 2 Views Right, XR Foot 2 Views Left.  X-rays are consistent with osteoarthritis.  Idiopathic urticaria -patient has history of recurrent urticaria for several years.  She states her symptoms have improved since she has been on Zyrtec.  Plan: C1 Esterase Inhibitor, Functional  Lumbosacral disc disease -she has chronic lower back pain.  She had L4-L5 laminectomy at age 30.  Vitamin D deficiency-she is on  vitamin D supplement.  Other medical problems are listed as follows:  History of hyperlipidemia  Right thyroid nodule  H/O  partial thyroidectomy  Chronic constipation  Laryngopharyngeal reflux (LPR)  Microhematuria  History of anxiety  Orders: Orders Placed This Encounter  Procedures  . XR Hand 2 View Right  . XR Hand 2 View Left  . XR Foot 2 Views Right  . XR Foot 2 Views Left  . CBC with Differential/Platelet  . COMPLETE METABOLIC PANEL WITH GFR  . Sedimentation rate  . Sjogrens syndrome-A extractable nuclear antibody  . Sjogrens syndrome-B extractable nuclear antibody  . Serum protein electrophoresis with reflex  . IgG, IgA, IgM  . Rheumatoid factor  . Cyclic citrul peptide antibody, IgG  . C1 Esterase Inhibitor, Functional   No orders of the defined types were placed in this encounter.   Follow-Up Instructions: Return for Hives, joint pain.   Bo Merino, MD  Note - This record has been created using Editor, commissioning.  Chart creation errors have been sought, but may not always  have been located. Such creation errors do not reflect on  the standard of medical care.

## 2020-10-05 DIAGNOSIS — Z3161 Procreative counseling and advice using natural family planning: Secondary | ICD-10-CM | POA: Diagnosis not present

## 2020-10-05 DIAGNOSIS — E288 Other ovarian dysfunction: Secondary | ICD-10-CM | POA: Diagnosis not present

## 2020-10-13 ENCOUNTER — Other Ambulatory Visit: Payer: Self-pay

## 2020-10-13 ENCOUNTER — Ambulatory Visit (HOSPITAL_COMMUNITY)
Admission: RE | Admit: 2020-10-13 | Discharge: 2020-10-13 | Disposition: A | Discharge status: Home / Self Care | Payer: 59 | Source: Ambulatory Visit | Attending: Obstetrics and Gynecology | Admitting: Obstetrics and Gynecology

## 2020-10-13 ENCOUNTER — Other Ambulatory Visit (HOSPITAL_COMMUNITY): Payer: Self-pay | Admitting: Obstetrics and Gynecology

## 2020-10-13 DIAGNOSIS — R52 Pain, unspecified: Secondary | ICD-10-CM

## 2020-10-13 DIAGNOSIS — M79662 Pain in left lower leg: Secondary | ICD-10-CM | POA: Diagnosis not present

## 2020-10-15 ENCOUNTER — Encounter: Payer: Self-pay | Admitting: Rheumatology

## 2020-10-15 ENCOUNTER — Ambulatory Visit: Payer: Self-pay

## 2020-10-15 ENCOUNTER — Ambulatory Visit: Payer: 59 | Admitting: Rheumatology

## 2020-10-15 ENCOUNTER — Other Ambulatory Visit: Payer: Self-pay

## 2020-10-15 VITALS — BP 109/79 | HR 90 | Ht 64.25 in | Wt 227.4 lb

## 2020-10-15 DIAGNOSIS — M79672 Pain in left foot: Secondary | ICD-10-CM

## 2020-10-15 DIAGNOSIS — R202 Paresthesia of skin: Secondary | ICD-10-CM

## 2020-10-15 DIAGNOSIS — M79641 Pain in right hand: Secondary | ICD-10-CM | POA: Diagnosis not present

## 2020-10-15 DIAGNOSIS — Z8659 Personal history of other mental and behavioral disorders: Secondary | ICD-10-CM

## 2020-10-15 DIAGNOSIS — E041 Nontoxic single thyroid nodule: Secondary | ICD-10-CM

## 2020-10-15 DIAGNOSIS — M3500 Sicca syndrome, unspecified: Secondary | ICD-10-CM | POA: Diagnosis not present

## 2020-10-15 DIAGNOSIS — L501 Idiopathic urticaria: Secondary | ICD-10-CM

## 2020-10-15 DIAGNOSIS — M519 Unspecified thoracic, thoracolumbar and lumbosacral intervertebral disc disorder: Secondary | ICD-10-CM

## 2020-10-15 DIAGNOSIS — E559 Vitamin D deficiency, unspecified: Secondary | ICD-10-CM | POA: Diagnosis not present

## 2020-10-15 DIAGNOSIS — M79671 Pain in right foot: Secondary | ICD-10-CM | POA: Diagnosis not present

## 2020-10-15 DIAGNOSIS — Z8639 Personal history of other endocrine, nutritional and metabolic disease: Secondary | ICD-10-CM | POA: Diagnosis not present

## 2020-10-15 DIAGNOSIS — K5909 Other constipation: Secondary | ICD-10-CM

## 2020-10-15 DIAGNOSIS — E890 Postprocedural hypothyroidism: Secondary | ICD-10-CM

## 2020-10-15 DIAGNOSIS — R700 Elevated erythrocyte sedimentation rate: Secondary | ICD-10-CM

## 2020-10-15 DIAGNOSIS — M79642 Pain in left hand: Secondary | ICD-10-CM

## 2020-10-15 DIAGNOSIS — M2550 Pain in unspecified joint: Secondary | ICD-10-CM

## 2020-10-15 DIAGNOSIS — R3129 Other microscopic hematuria: Secondary | ICD-10-CM

## 2020-10-15 DIAGNOSIS — K219 Gastro-esophageal reflux disease without esophagitis: Secondary | ICD-10-CM

## 2020-10-19 ENCOUNTER — Ambulatory Visit: Payer: 59 | Admitting: Dermatology

## 2020-10-19 ENCOUNTER — Encounter: Payer: Self-pay | Admitting: Dermatology

## 2020-10-19 ENCOUNTER — Other Ambulatory Visit: Payer: Self-pay

## 2020-10-19 DIAGNOSIS — D1801 Hemangioma of skin and subcutaneous tissue: Secondary | ICD-10-CM | POA: Diagnosis not present

## 2020-10-19 DIAGNOSIS — L858 Other specified epidermal thickening: Secondary | ICD-10-CM | POA: Diagnosis not present

## 2020-10-19 DIAGNOSIS — L304 Erythema intertrigo: Secondary | ICD-10-CM

## 2020-10-19 DIAGNOSIS — Z1283 Encounter for screening for malignant neoplasm of skin: Secondary | ICD-10-CM

## 2020-10-19 NOTE — Patient Instructions (Signed)
ZEASORB POWDER AF OR REGULAR OVER THE COUNTER TRIPLE PASTE AF ALSO OVER THE COUNTER

## 2020-10-20 NOTE — Progress Notes (Signed)
I will discuss results at the follow-up visit.

## 2020-10-21 LAB — CBC WITH DIFFERENTIAL/PLATELET
Absolute Monocytes: 385 cells/uL (ref 200–950)
Basophils Absolute: 39 cells/uL (ref 0–200)
Basophils Relative: 0.7 %
Eosinophils Absolute: 77 cells/uL (ref 15–500)
Eosinophils Relative: 1.4 %
HCT: 42 % (ref 35.0–45.0)
Hemoglobin: 14 g/dL (ref 11.7–15.5)
Lymphs Abs: 1689 cells/uL (ref 850–3900)
MCH: 29.2 pg (ref 27.0–33.0)
MCHC: 33.3 g/dL (ref 32.0–36.0)
MCV: 87.7 fL (ref 80.0–100.0)
MPV: 10.3 fL (ref 7.5–12.5)
Monocytes Relative: 7 %
Neutro Abs: 3311 cells/uL (ref 1500–7800)
Neutrophils Relative %: 60.2 %
Platelets: 307 10*3/uL (ref 140–400)
RBC: 4.79 10*6/uL (ref 3.80–5.10)
RDW: 12.9 % (ref 11.0–15.0)
Total Lymphocyte: 30.7 %
WBC: 5.5 10*3/uL (ref 3.8–10.8)

## 2020-10-21 LAB — COMPLETE METABOLIC PANEL WITH GFR
AG Ratio: 1.6 (calc) (ref 1.0–2.5)
ALT: 31 U/L — ABNORMAL HIGH (ref 6–29)
AST: 24 U/L (ref 10–30)
Albumin: 4.1 g/dL (ref 3.6–5.1)
Alkaline phosphatase (APISO): 60 U/L (ref 31–125)
BUN: 20 mg/dL (ref 7–25)
CO2: 26 mmol/L (ref 20–32)
Calcium: 9 mg/dL (ref 8.6–10.2)
Chloride: 105 mmol/L (ref 98–110)
Creat: 0.69 mg/dL (ref 0.50–1.10)
GFR, Est African American: 128 mL/min/{1.73_m2} (ref >=60)
GFR, Est Non African American: 110 mL/min/{1.73_m2} (ref >=60)
Globulin: 2.5 g/dL (calc) (ref 1.9–3.7)
Glucose, Bld: 98 mg/dL (ref 65–99)
Potassium: 4.4 mmol/L (ref 3.5–5.3)
Sodium: 140 mmol/L (ref 135–146)
Total Bilirubin: 0.3 mg/dL (ref 0.2–1.2)
Total Protein: 6.6 g/dL (ref 6.1–8.1)

## 2020-10-21 LAB — PROTEIN ELECTROPHORESIS, SERUM, WITH REFLEX
Albumin ELP: 4 g/dL (ref 3.8–4.8)
Alpha 1: 0.3 g/dL (ref 0.2–0.3)
Alpha 2: 0.8 g/dL (ref 0.5–0.9)
Beta 2: 0.5 g/dL (ref 0.2–0.5)
Beta Globulin: 0.5 g/dL (ref 0.4–0.6)
Gamma Globulin: 0.9 g/dL (ref 0.8–1.7)
Total Protein: 6.9 g/dL (ref 6.1–8.1)

## 2020-10-21 LAB — SJOGRENS SYNDROME-A EXTRACTABLE NUCLEAR ANTIBODY: SSA (Ro) (ENA) Antibody, IgG: <1 AI

## 2020-10-21 LAB — RHEUMATOID FACTOR: Rheumatoid fact SerPl-aCnc: <14 IU/mL (ref <14)

## 2020-10-21 LAB — SJOGRENS SYNDROME-B EXTRACTABLE NUCLEAR ANTIBODY: SSB (La) (ENA) Antibody, IgG: <1 AI

## 2020-10-21 LAB — IGG, IGA, IGM
IgG (Immunoglobin G), Serum: 981 mg/dL (ref 600–1640)
IgM, Serum: 181 mg/dL (ref 50–300)
Immunoglobulin A: 366 mg/dL — ABNORMAL HIGH (ref 47–310)

## 2020-10-21 LAB — IFE INTERPRETATION: Immunofix Electr Int: NOT DETECTED

## 2020-10-21 LAB — CYCLIC CITRUL PEPTIDE ANTIBODY, IGG: Cyclic Citrullin Peptide Ab: <16 UNITS

## 2020-10-21 LAB — SEDIMENTATION RATE: Sed Rate: 14 mm/h (ref 0–20)

## 2020-10-21 LAB — C1 ESTERASE INHIBITOR, FUNCTIONAL: C1 Esterase Inhibitor Funct: >100 % (ref >=68)

## 2020-10-25 ENCOUNTER — Encounter: Payer: Self-pay | Admitting: Dermatology

## 2020-10-25 NOTE — Progress Notes (Signed)
   New Patient   Subjective  Christy Alexander is a 40 y.o. female who presents for the following: Annual Exam (Rash yeast? Under breast /LAST OV 2017/).  General skin check Location: Rash under breasts, comes and goes Duration:  Quality:  Associated Signs/Symptoms: Modifying Factors:  Severity:  Timing: Context:    The following portions of the chart were reviewed this encounter and updated as appropriate:  Tobacco  Allergies  Meds  Problems  Med Hx  Surg Hx  Fam Hx      Objective  Well appearing patient in no apparent distress; mood and affect are within normal limits. Objective  Chest - [facility]): Annual skin examination, no atypical moles or melanoma.  RIGHT GREAT TOE FRECKLE STABLE and normal dermoscopy. UNDER BREAST AND LEFT SKIN FOLD GROIN RASH  Objective  Left Upper Arm - Posterior, Right Upper Arm - Posterior: Multiple tiny follicular keratotic papules  Objective  Neck - Anterior: 2 mm smooth red papule  Objective  Left Axilla, Left Inframammary Fold, Left Inguinal Area, Right Inframammary Fold: Subtle macular erythema without satellite lesions or pustules.  Currently most compatible with irritant dermatitis but cannot exclude historical Candida.  ZEASORB POWDER AF OR REGULAR OVER THE COUNTER TRIPLE PASTE AF ALSO OVER THE COUNTER    A full examination was performed including scalp, head, eyes, ears, nose, lips, neck, chest, axillae, abdomen, back, buttocks, bilateral upper extremities, bilateral lower extremities, hands, feet, fingers, toes, fingernails, and toenails. All findings within normal limits unless otherwise noted below.   Assessment & Plan  Screening exam for skin cancer Chest - [facility])  Annual skin examination, encouraged to self examine twice annually.  Continued ultraviolet protection.  KP (keratosis pilaris) (2) Left Upper Arm - Posterior; Right Upper Arm - Posterior  No intervention initiated  Cherry angioma Neck -  Anterior  Leave if stable  Intertrigo (4) Left Inframammary Fold; Right Inframammary Fold; Left Axilla; Left Inguinal Area  Soaking followed by triple paste AF for flares.  Zeasorb AF daily after bathing as maintenance therapy.  Follow-up by MyChart or phone in 4 to 6 weeks for status report.

## 2020-11-02 MED FILL — OMEPRAZOLE 40 MG CPDR: 40 | 30 days supply | Qty: 60 | Fill #1

## 2020-11-09 NOTE — Progress Notes (Signed)
Office Visit Note  Patient: Christy Alexander             Date of Birth: May 17, 1981           MRN: [id number]             PCP: Clinton Quant, MD Referring: Clinton Quant, MD Visit Date: 11/11/2020 Occupation: @GUAROCC @  Subjective:  Dry mouth and dry eyes.   History of Present Illness: Christy Alexander is a 40 y.o. female with sicca symptoms.  She states she continues to have dry mouth and dry eyes.  She has some stiffness in her hands and feet but no swelling.  She has been having intermittent hives.  She has been drinking more water.  She states occasionally her mouth becomes so dry that she gets sore in her mouth.  Activities of Daily Living:  Patient reports morning stiffness for 10-15 minutes.   Patient Denies nocturnal pain.  Difficulty dressing/grooming: Denies Difficulty climbing stairs: Denies Difficulty getting out of chair: Denies Difficulty using hands for taps, buttons, cutlery, and/or writing: Denies  Review of Systems  Constitutional: Negative for fatigue.  HENT: Positive for mouth dryness and nose dryness. Negative for mouth sores.   Eyes: Positive for dryness. Negative for pain and itching.  Respiratory: Negative for shortness of breath and difficulty breathing.   Cardiovascular: Negative for chest pain and palpitations.  Gastrointestinal: Positive for constipation and diarrhea. Negative for blood in stool.  Endocrine: Negative for increased urination.  Genitourinary: Negative for difficulty urinating.  Musculoskeletal: Positive for arthralgias, joint pain, myalgias, morning stiffness, muscle tenderness and myalgias. Negative for joint swelling.  Skin: Negative for color change, rash, hair loss and redness.  Allergic/Immunologic: Negative for susceptible to infections.  Neurological: Negative for dizziness, numbness, headaches, memory loss and weakness.  Hematological: Negative for bruising/bleeding tendency.  Psychiatric/Behavioral: Negative for confusion  and sleep disturbance.    PMFS History:  Patient Active Problem List   Diagnosis Date Noted  . Microhematuria 07/21/2020  . Urge incontinence 07/21/2020  . Referred otalgia of both ears 10/31/2019  . Throat discomfort 10/31/2019  . Double vision 06/04/2019  . Paresthesia 06/04/2019  . Weakness 06/04/2019  . Hyperglycemia 03/22/2019  . Dysphonia 07/03/2018  . H/O partial thyroidectomy 07/03/2018  . Vitamin D deficiency 06/26/2018  . Right thyroid nodule 01/27/2018  . Multiple joint pain 05/29/2017  . Hyperlipidemia 11/21/2016  . Elevated C-reactive protein 06/30/2016  . Laryngopharyngeal reflux (LPR) 05/10/2016  . Obesity with body mass index 30 or greater 05/10/2016  . AP (abdominal pain) 03/18/2016  . 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 Chronic constipation 03/08/2016  . Fissure in skin 03/08/2016  . 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  . Drug allergy 01/26/2016  . 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 Allergic rhinitis due to pollen 01/26/2016  . Arthritis 01/26/2016  . Anxiety 11/10/2015  . Lumbosacral disc disease 02/27/2015    Past Medical History:  Diagnosis Date  . Anxiety   . Back pain at L4-L5 level   . Delayed pressure urticaria dx age 89   trauma to skin causes deep tissue  swelling  . GERD (gastroesophageal reflux disease)   . Lower back pain    l4 to l5  . Right thyroid nodule   . TMJ (dislocation of temporomandibular joint)     Family History  Problem Relation Age of Onset  . Diabetes type II Mother   . Heart disease Mother   . Sleep apnea Mother   . Tongue cancer Father   .  Glaucoma Father   . Heart disease Father   . Diabetes type II Brother   . Cataracts Brother   . Heart attack Maternal Grandmother   . Heart disease Maternal Grandmother   . Other Maternal Grandfather        worked in [employer]  . Colon cancer Paternal Grandmother   . Diabetes Paternal Grandfather   . Heart disease Paternal Grandfather   . Allergic rhinitis Neg Hx   .  Angioedema Neg Hx   . Asthma Neg Hx   . Eczema Neg Hx   . Immunodeficiency Neg Hx   . Urticaria Neg Hx    Past Surgical History:  Procedure Laterality Date  . LUMBAR LAMINECTOMY  2006   L4 and L5 discectomy micro, hemilaminectomy, had conscious sedation for   . THYROID LOBECTOMY Right 02/01/2018   Procedure: RIGHT THYROID LOBECTOMY;  Surgeon: Armandina Gemma, MD;  Location: [facility];  Service: General;  Laterality: Right;  . [place] EXTRACTION  2001   Social History   Social History Narrative   Lives at home with her husband.   Right-handed.   1 cup coffee per day.   Immunization History  Administered Date(s) Administered  . Influenza,inj,quad, With Preservative 07/10/2018, 07/01/2019  . Influenza-Unspecified 06/24/2015, 06/23/2016, 06/26/2017, 07/09/2018, 07/01/2019, 06/30/2020  . PFIZER(Purple Top)SARS-COV-2 Vaccination 11/06/2019, 11/28/2019, 09/08/2020  . Td 02/04/2015  . Tdap 02/04/2015  . Tetanus 02/04/2015     Objective: Vital Signs: BP 116/79 (BP Location: Right Arm, Patient Position: Sitting, Cuff Size: Normal)   Pulse 74   Resp 16   Ht 5\' 4"  (1.626 m)   Wt 224 lb (101.6 kg)   BMI 38.45 kg/m    Physical Exam Vitals and nursing note reviewed.  Constitutional:      Appearance: She is well-developed and well-nourished.  HENT:     Head: Normocephalic and atraumatic.  Eyes:     Extraocular Movements: EOM normal.     Conjunctiva/sclera: Conjunctivae normal.  Cardiovascular:     Rate and Rhythm: Normal rate and regular rhythm.     Pulses: Intact distal pulses.     Heart sounds: Normal heart sounds.  Pulmonary:     Effort: Pulmonary effort is normal.     Breath sounds: Normal breath sounds.  Abdominal:     General: Bowel sounds are normal.     Palpations: Abdomen is soft.  Musculoskeletal:     Cervical back: Normal range of motion.  Lymphadenopathy:     Cervical: No cervical adenopathy.  Skin:    General: Skin is warm and dry.     Capillary Refill:  Capillary refill takes less than 2 seconds.  Neurological:     Mental Status: She is alert and oriented to person, place, and time.  Psychiatric:        Mood and Affect: Mood and affect normal.        Behavior: Behavior normal.      Musculoskeletal Exam: C-spine, thoracic and lumbar spine with good range of motion.  Shoulder joints, elbow joints, wrist joints, MCPs PIPs and DIPs with good range of motion with no synovitis.  Hip joints, knee joints, ankles, MTPs and PIPs with good range of motion with no synovitis.  CDAI Exam: CDAI Score: -- Patient Global: --; Provider Global: -- Swollen: --; Tender: -- Joint Exam 11/11/2020   No joint exam has been documented for this visit   There is currently no information documented on the homunculus. Go to the Rheumatology activity and complete  the homunculus joint exam.  Investigation: No additional findings.  Imaging: XR Foot 2 Views Left  Result Date: 10/15/2020 First MTP, PIP and DIP narrowing was noted.  Dorsal spurring was noted.  No intertarsal, tibiotalar or subtalar joint space narrowing was noted.  Inferior and posterior calcaneal spurs were noted. Impression: These findings are consistent with osteoarthritis of the foot.  XR Foot 2 Views Right  Result Date: 10/15/2020 First MTP, PIP and DIP narrowing was noted.  No intertarsal, tibiotalar or subtalar joint space narrowing was noted.  No erosive changes were noted.  Posterior calcaneal spur was noted. Impression: These findings are consistent with osteoarthritis of the foot.  XR Hand 2 View Left  Result Date: 10/15/2020 Mild CMC, PIP and DIP narrowing was noted.  No MCP, intercarpal or radiocarpal joint space narrowing was noted.  No erosive changes were noted. Impression: These findings are consistent with early osteoarthritis.  XR Hand 2 View Right  Result Date: 10/15/2020 Mild PIP and DIP narrowing was noted.  No MCP, intercarpal or radiocarpal joint space narrowing was  noted.  No erosive changes were noted. Impression: These findings are consistent with early osteoarthritis.  VAS [place] LOWER EXTREMITY VENOUS (DVT)  Result Date: 10/13/2020  Lower Venous DVT Study Indications: Edema, Swelling, and Pain. Other Indications: Patient complains of several days of left posterolateral                    proximal calf pain and edema. The pain resolved today.                    Maternal history of varicose veins. Performing Technologist: Delorise Shiner RVT  Examination Guidelines: A complete evaluation includes B-mode imaging, spectral Doppler, color Doppler, and power Doppler as needed of all accessible portions of each vessel. Bilateral testing is considered an integral part of a complete examination. Limited examinations for reoccurring indications may be performed as noted. The reflux portion of the exam is performed with the patient in reverse Trendelenburg.  +-----+---------------+---------+-----------+----------+--------------+ RIGHTCompressibilityPhasicitySpontaneityPropertiesThrombus Aging +-----+---------------+---------+-----------+----------+--------------+ CFV  Full           Yes      Yes                                 +-----+---------------+---------+-----------+----------+--------------+   +---------+---------------+---------+-----------+----------+--------------+ LEFT     CompressibilityPhasicitySpontaneityPropertiesThrombus Aging +---------+---------------+---------+-----------+----------+--------------+ CFV      Full           Yes      Yes                                 +---------+---------------+---------+-----------+----------+--------------+ SFJ      Full                    Yes                                 +---------+---------------+---------+-----------+----------+--------------+ FV Prox  Full           Yes      Yes                                 +---------+---------------+---------+-----------+----------+--------------+  FV Mid   Full  Yes      Yes                                 +---------+---------------+---------+-----------+----------+--------------+ FV DistalFull           Yes      Yes                                 +---------+---------------+---------+-----------+----------+--------------+ PFV      Full                    Yes                                 +---------+---------------+---------+-----------+----------+--------------+ POP      Full           Yes      Yes                                 +---------+---------------+---------+-----------+----------+--------------+ PTV      Full                                                        +---------+---------------+---------+-----------+----------+--------------+ PERO     Full                                                        +---------+---------------+---------+-----------+----------+--------------+ GSV      Full                    Yes                                 +---------+---------------+---------+-----------+----------+--------------+ SSV      Full                    Yes                                 +---------+---------------+---------+-----------+----------+--------------+ Reflux identified in the thigh segment of the great saphenous vein.   Findings reported to voicemail for Johny Shears at 13:34.  Summary: RIGHT: - No evidence of deep vein thrombosis in the lower extremity. No indirect evidence of obstruction proximal to the inguinal ligament.  LEFT: - There is no evidence of deep vein thrombosis in the lower extremity. - There is no evidence of superficial venous thrombosis.  - No cystic structure found in the popliteal fossa.  *See table(s) above for measurements and observations. Electronically signed by Monica Martinez MD on 10/13/2020 at 4:51:47 PM.    Final     Recent Labs: Lab Results  Component Value Date   WBC 5.5 10/15/2020   HGB 14.0 10/15/2020   PLT 307 10/15/2020    NA 140 10/15/2020   K 4.4 10/15/2020   CL 105 10/15/2020  CO2 26 10/15/2020   GLUCOSE 98 10/15/2020   BUN 20 10/15/2020   CREATININE 0.69 10/15/2020   BILITOT 0.3 10/15/2020   ALKPHOS 68 12/27/2019   AST 24 10/15/2020   ALT 31 (H) 10/15/2020   PROT 6.6 10/15/2020   PROT 6.9 10/15/2020   ALBUMIN 3.6 12/27/2019   CALCIUM 9.0 10/15/2020   GFRAA 128 10/15/2020   October 10, 2020 IFE negative, immunoglobulins IgG and IgM normal, IgA mildly elevated, anti-Ro antibody negative, anti-La antibody negative, RF negative, anti-CCP negative, C1 esterase inhibitor function> 100  Speciality Comments: No specialty comments available.  Procedures:  No procedures performed Allergies: Aspirin, Chlorhexidine, Contrast media [iodinated diagnostic agents], Esomeprazole, and Nsaids   Assessment / Plan:     Visit Diagnoses: Elevated sed rate - History of elevated sedimentation rate for many years.  I am uncertain about the etiology of elevated sedimentation rate.  All the work-up was negative.  Sicca complex [facility]) - June 08, 2020 ANA negative, recent labs showed SSA antibody negative, SSB antibody negative.  Lab findings were discussed with the patient at length.  Seronegative Sjogren's is also a possibility which was discussed.  At this point I would recommend using over-the-counter products.  Products like Systane complete, Biotene products, Oral-B mouthwash and Xylimelt patches were discussed.  Increase fluid intake, natural products rich in omega-3  were discussed.  Patient could not tolerate omega-3 capsules in the past due to reflux symptoms.  Keeping a humidifier in the room and cutting back on heat and air was discussed.  I also discussed  use of pilocarpine.  Indications side effects and contraindications of pilocarpine were discussed.  Patient would like to try over-the-counter products at this time.  She will contact me in case her symptoms get worse.  Primary osteoarthritis of both hands -  History of stiffness in hands.  X-ray showed early osteoarthritic changes.  X-ray findings were discussed with the patient.  All autoimmune work-up was negative.  Joint protection muscle strengthening was discussed.  Primary osteoarthritis of both feet - History of a stiffness in feet.  X-ray showed early osteoarthritic changes.  X-ray findings were discussed with the patient.  Proper fitting shoes were discussed.  Idiopathic urticaria - History of recurrent urticaria for many years.  She had a good response to Zyrtec.  C1 esterase inhibitor function is normal.  She will follow-up with her allergist.  Lumbosacral disc disease - Status post L4-L5 laminectomy at age 8.  She suffers from chronic pain.  Vitamin D deficiency-she is on vitamin D supplement.  Other medical problems are listed as follows:  H/O partial thyroidectomy  History of hyperlipidemia  Laryngopharyngeal reflux (LPR)  Chronic constipation-increase fluid supplement and increase fiber intake was discussed.  Microhematuria  History of anxiety  Orders: No orders of the defined types were placed in this encounter.  No orders of the defined types were placed in this encounter.     Follow-Up Instructions: Return if symptoms worsen or fail to improve, for sicca.   Bo Merino, MD  Note - This record has been created using Editor, commissioning.  Chart creation errors have been sought, but may not always  have been located. Such creation errors do not reflect on  the standard of medical care.

## 2020-11-11 ENCOUNTER — Other Ambulatory Visit: Payer: Self-pay

## 2020-11-11 ENCOUNTER — Ambulatory Visit: Payer: 59 | Admitting: Rheumatology

## 2020-11-11 ENCOUNTER — Encounter: Payer: Self-pay | Admitting: Rheumatology

## 2020-11-11 VITALS — BP 116/79 | HR 74 | Resp 16 | Ht 64.0 in | Wt 224.0 lb

## 2020-11-11 DIAGNOSIS — M19071 Primary osteoarthritis, right ankle and foot: Secondary | ICD-10-CM

## 2020-11-11 DIAGNOSIS — Z8659 Personal history of other mental and behavioral disorders: Secondary | ICD-10-CM

## 2020-11-11 DIAGNOSIS — L501 Idiopathic urticaria: Secondary | ICD-10-CM

## 2020-11-11 DIAGNOSIS — E559 Vitamin D deficiency, unspecified: Secondary | ICD-10-CM | POA: Diagnosis not present

## 2020-11-11 DIAGNOSIS — R700 Elevated erythrocyte sedimentation rate: Secondary | ICD-10-CM | POA: Diagnosis not present

## 2020-11-11 DIAGNOSIS — M519 Unspecified thoracic, thoracolumbar and lumbosacral intervertebral disc disorder: Secondary | ICD-10-CM

## 2020-11-11 DIAGNOSIS — K219 Gastro-esophageal reflux disease without esophagitis: Secondary | ICD-10-CM

## 2020-11-11 DIAGNOSIS — R3129 Other microscopic hematuria: Secondary | ICD-10-CM

## 2020-11-11 DIAGNOSIS — M19042 Primary osteoarthritis, left hand: Secondary | ICD-10-CM

## 2020-11-11 DIAGNOSIS — M3500 Sicca syndrome, unspecified: Secondary | ICD-10-CM | POA: Diagnosis not present

## 2020-11-11 DIAGNOSIS — Z8639 Personal history of other endocrine, nutritional and metabolic disease: Secondary | ICD-10-CM | POA: Diagnosis not present

## 2020-11-11 DIAGNOSIS — M19041 Primary osteoarthritis, right hand: Secondary | ICD-10-CM | POA: Diagnosis not present

## 2020-11-11 DIAGNOSIS — E890 Postprocedural hypothyroidism: Secondary | ICD-10-CM

## 2020-11-11 DIAGNOSIS — K5909 Other constipation: Secondary | ICD-10-CM

## 2020-11-11 DIAGNOSIS — M19072 Primary osteoarthritis, left ankle and foot: Secondary | ICD-10-CM

## 2020-11-16 DIAGNOSIS — M25512 Pain in left shoulder: Secondary | ICD-10-CM | POA: Diagnosis not present

## 2020-11-30 MED FILL — OMEPRAZOLE 40 MG CPDR: 40 | 30 days supply | Qty: 60 | Fill #2

## 2020-11-30 MED FILL — metFORMIN HCL ER 500 MG TB2: 500 | 90 days supply | Qty: 90 | Fill #2

## 2020-12-08 ENCOUNTER — Other Ambulatory Visit (HOSPITAL_COMMUNITY): Payer: Self-pay

## 2020-12-08 MED FILL — CEPHALEXIN 500 MG CAPSULE: 500 | 7 days supply | Qty: 30 | Fill #0

## 2020-12-10 ENCOUNTER — Other Ambulatory Visit (HOSPITAL_BASED_OUTPATIENT_CLINIC_OR_DEPARTMENT_OTHER): Payer: Self-pay

## 2020-12-14 DIAGNOSIS — K625 Hemorrhage of anus and rectum: Secondary | ICD-10-CM | POA: Diagnosis not present

## 2020-12-14 DIAGNOSIS — K6289 Other specified diseases of anus and rectum: Secondary | ICD-10-CM | POA: Diagnosis not present

## 2020-12-14 DIAGNOSIS — K5900 Constipation, unspecified: Secondary | ICD-10-CM | POA: Diagnosis not present

## 2020-12-14 DIAGNOSIS — K602 Anal fissure, unspecified: Secondary | ICD-10-CM | POA: Diagnosis not present

## 2020-12-15 MED FILL — MELOXICAM 7.5 MG TABLET: 7.5 | 90 days supply | Qty: 90 | Fill #1

## 2020-12-20 ENCOUNTER — Telehealth: Payer: Self-pay | Admitting: Gastroenterology

## 2020-12-20 NOTE — Telephone Encounter (Signed)
Patient reports being seen by Dr. Benson Norway last Monday and being diagnosed with a an anal fissure.  Was prescribed diltiazem gel and has been applying that since Monday.  Says that she feels like there is something external now that is more sore and tender to the touch.  Says that sometimes when she wipes it looks like may be pus-like material.  Says that she took a picture and something external looks like it is Verry in color.  She tried to upload it to the portal, but has been unsuccessful.  She is afebrile.  Advised her that it is really hard to know without examining her to see what else is going on.  Advised that she could try also applying some hydrocortisone to the area today to see if that helps.  Advised to call Dr. Ulyses Amor office tomorrow morning to see if she can be seen again and examined once again.  Otherwise if she is feeling terrible today, pain is much worse, becomes febrile, etc. then should proceed to urgent care for evaluation.

## 2020-12-27 MED FILL — Omeprazole Cap Delayed Release 40 MG: ORAL | 30 days supply | Qty: 60 | Fill #0 | Status: AC

## 2020-12-28 ENCOUNTER — Other Ambulatory Visit (HOSPITAL_COMMUNITY): Payer: Self-pay

## 2021-01-04 DIAGNOSIS — M25561 Pain in right knee: Secondary | ICD-10-CM | POA: Diagnosis not present

## 2021-01-04 DIAGNOSIS — M25512 Pain in left shoulder: Secondary | ICD-10-CM | POA: Diagnosis not present

## 2021-01-11 ENCOUNTER — Other Ambulatory Visit (HOSPITAL_COMMUNITY): Payer: Self-pay

## 2021-01-11 DIAGNOSIS — K5900 Constipation, unspecified: Secondary | ICD-10-CM | POA: Diagnosis not present

## 2021-01-11 DIAGNOSIS — K602 Anal fissure, unspecified: Secondary | ICD-10-CM | POA: Diagnosis not present

## 2021-01-11 MED ORDER — LINZESS 72 MCG PO CAPS
ORAL_CAPSULE | ORAL | 4 refills | Status: DC
  Filled 2021-01-11: qty 90, 90d supply, fill #0
  Filled 2021-04-07: qty 90, 90d supply, fill #1

## 2021-01-12 ENCOUNTER — Other Ambulatory Visit (HOSPITAL_COMMUNITY): Payer: Self-pay

## 2021-01-15 ENCOUNTER — Other Ambulatory Visit (HOSPITAL_COMMUNITY): Payer: Self-pay

## 2021-01-18 ENCOUNTER — Other Ambulatory Visit: Payer: Self-pay | Admitting: Internal Medicine

## 2021-01-18 DIAGNOSIS — K219 Gastro-esophageal reflux disease without esophagitis: Secondary | ICD-10-CM | POA: Diagnosis not present

## 2021-01-18 DIAGNOSIS — E041 Nontoxic single thyroid nodule: Secondary | ICD-10-CM

## 2021-01-18 DIAGNOSIS — J302 Other seasonal allergic rhinitis: Secondary | ICD-10-CM | POA: Diagnosis not present

## 2021-01-18 DIAGNOSIS — K5909 Other constipation: Secondary | ICD-10-CM | POA: Diagnosis not present

## 2021-01-18 DIAGNOSIS — E785 Hyperlipidemia, unspecified: Secondary | ICD-10-CM | POA: Diagnosis not present

## 2021-01-18 DIAGNOSIS — M2550 Pain in unspecified joint: Secondary | ICD-10-CM | POA: Diagnosis not present

## 2021-01-18 DIAGNOSIS — R1010 Upper abdominal pain, unspecified: Secondary | ICD-10-CM | POA: Diagnosis not present

## 2021-01-18 DIAGNOSIS — Z0000 Encounter for general adult medical examination without abnormal findings: Secondary | ICD-10-CM | POA: Diagnosis not present

## 2021-01-18 DIAGNOSIS — L509 Urticaria, unspecified: Secondary | ICD-10-CM | POA: Diagnosis not present

## 2021-01-18 DIAGNOSIS — R7309 Other abnormal glucose: Secondary | ICD-10-CM | POA: Diagnosis not present

## 2021-01-18 DIAGNOSIS — R739 Hyperglycemia, unspecified: Secondary | ICD-10-CM | POA: Diagnosis not present

## 2021-01-19 ENCOUNTER — Other Ambulatory Visit: Payer: Self-pay | Admitting: Internal Medicine

## 2021-01-19 DIAGNOSIS — R1010 Upper abdominal pain, unspecified: Secondary | ICD-10-CM

## 2021-01-22 ENCOUNTER — Other Ambulatory Visit (HOSPITAL_COMMUNITY): Payer: Self-pay

## 2021-01-24 MED FILL — Omeprazole Cap Delayed Release 40 MG: ORAL | 30 days supply | Qty: 60 | Fill #1 | Status: AC

## 2021-01-25 ENCOUNTER — Other Ambulatory Visit (HOSPITAL_COMMUNITY): Payer: Self-pay

## 2021-01-27 ENCOUNTER — Other Ambulatory Visit: Payer: 59

## 2021-01-28 ENCOUNTER — Ambulatory Visit
Admission: RE | Admit: 2021-01-28 | Discharge: 2021-01-28 | Disposition: A | Discharge status: Home / Self Care | Payer: 59 | Source: Ambulatory Visit | Attending: Internal Medicine | Admitting: Internal Medicine

## 2021-01-28 ENCOUNTER — Other Ambulatory Visit: Payer: Self-pay

## 2021-01-28 DIAGNOSIS — R1010 Upper abdominal pain, unspecified: Secondary | ICD-10-CM

## 2021-01-28 DIAGNOSIS — R109 Unspecified abdominal pain: Secondary | ICD-10-CM | POA: Diagnosis not present

## 2021-02-05 ENCOUNTER — Ambulatory Visit
Admission: RE | Admit: 2021-02-05 | Discharge: 2021-02-05 | Disposition: A | Discharge status: Home / Self Care | Payer: 59 | Source: Ambulatory Visit | Attending: Internal Medicine | Admitting: Internal Medicine

## 2021-02-05 ENCOUNTER — Other Ambulatory Visit: Payer: Self-pay

## 2021-02-05 DIAGNOSIS — E890 Postprocedural hypothyroidism: Secondary | ICD-10-CM | POA: Diagnosis not present

## 2021-02-05 DIAGNOSIS — E041 Nontoxic single thyroid nodule: Secondary | ICD-10-CM

## 2021-02-28 MED FILL — Omeprazole Cap Delayed Release 40 MG: ORAL | 30 days supply | Qty: 60 | Fill #2 | Status: AC

## 2021-02-28 MED FILL — Metformin HCl Tab ER 24HR 500 MG: ORAL | 90 days supply | Qty: 90 | Fill #0 | Status: AC

## 2021-03-01 ENCOUNTER — Other Ambulatory Visit (HOSPITAL_COMMUNITY): Payer: Self-pay

## 2021-03-24 ENCOUNTER — Other Ambulatory Visit (HOSPITAL_COMMUNITY): Payer: Self-pay

## 2021-03-24 DIAGNOSIS — N907 Vulvar cyst: Secondary | ICD-10-CM | POA: Diagnosis not present

## 2021-03-24 DIAGNOSIS — L723 Sebaceous cyst: Secondary | ICD-10-CM | POA: Diagnosis not present

## 2021-03-24 MED ORDER — SULFAMETHOXAZOLE-TRIMETHOPRIM 800-160 MG PO TABS
ORAL_TABLET | ORAL | 0 refills | Status: DC
  Filled 2021-03-24: qty 20, 10d supply, fill #0

## 2021-03-28 ENCOUNTER — Other Ambulatory Visit (HOSPITAL_COMMUNITY): Payer: Self-pay

## 2021-03-29 ENCOUNTER — Other Ambulatory Visit (HOSPITAL_COMMUNITY): Payer: Self-pay

## 2021-03-29 MED ORDER — MELOXICAM 7.5 MG PO TABS
ORAL_TABLET | ORAL | 3 refills | Status: DC
  Filled 2021-03-29: qty 90, 90d supply, fill #0

## 2021-03-29 MED ORDER — METFORMIN HCL ER 500 MG PO TB24
500.0000 mg | ORAL_TABLET | Freq: Every day | ORAL | 0 refills | Status: DC
  Filled 2021-03-29: qty 90, 90d supply, fill #0
  Filled 2021-06-07: qty 30, 30d supply, fill #0
  Filled 2021-07-04: qty 30, 30d supply, fill #1
  Filled 2021-08-01: qty 30, 30d supply, fill #2

## 2021-03-30 ENCOUNTER — Other Ambulatory Visit (HOSPITAL_COMMUNITY): Payer: Self-pay

## 2021-03-31 ENCOUNTER — Other Ambulatory Visit (HOSPITAL_COMMUNITY): Payer: Self-pay

## 2021-03-31 DIAGNOSIS — H5213 Myopia, bilateral: Secondary | ICD-10-CM | POA: Diagnosis not present

## 2021-03-31 DIAGNOSIS — H52223 Regular astigmatism, bilateral: Secondary | ICD-10-CM | POA: Diagnosis not present

## 2021-04-01 ENCOUNTER — Other Ambulatory Visit (HOSPITAL_COMMUNITY): Payer: Self-pay

## 2021-04-01 MED ORDER — OMEPRAZOLE 40 MG PO CPDR
DELAYED_RELEASE_CAPSULE | ORAL | 5 refills | Status: DC
  Filled 2021-04-01: qty 60, 30d supply, fill #0
  Filled 2021-05-02: qty 60, 30d supply, fill #1
  Filled 2021-06-06: qty 60, 30d supply, fill #2
  Filled 2021-07-04: qty 60, 30d supply, fill #3
  Filled 2021-08-01: qty 60, 30d supply, fill #4
  Filled 2021-09-06: qty 60, 30d supply, fill #5

## 2021-04-05 ENCOUNTER — Other Ambulatory Visit (HOSPITAL_COMMUNITY): Payer: Self-pay

## 2021-04-05 MED ORDER — OMEPRAZOLE 40 MG PO CPDR
DELAYED_RELEASE_CAPSULE | ORAL | 5 refills | Status: DC
  Filled 2021-04-05: qty 60, 30d supply, fill #0

## 2021-04-07 ENCOUNTER — Other Ambulatory Visit (HOSPITAL_COMMUNITY): Payer: Self-pay

## 2021-05-03 ENCOUNTER — Other Ambulatory Visit (HOSPITAL_COMMUNITY): Payer: Self-pay

## 2021-06-06 ENCOUNTER — Other Ambulatory Visit (HOSPITAL_COMMUNITY): Payer: Self-pay

## 2021-06-07 ENCOUNTER — Other Ambulatory Visit (HOSPITAL_COMMUNITY): Payer: Self-pay

## 2021-07-05 ENCOUNTER — Other Ambulatory Visit (HOSPITAL_COMMUNITY): Payer: Self-pay

## 2021-08-01 ENCOUNTER — Other Ambulatory Visit (HOSPITAL_COMMUNITY): Payer: Self-pay

## 2021-08-02 ENCOUNTER — Other Ambulatory Visit (HOSPITAL_COMMUNITY): Payer: Self-pay

## 2021-09-06 ENCOUNTER — Other Ambulatory Visit (HOSPITAL_COMMUNITY): Payer: Self-pay

## 2021-09-06 MED ORDER — METFORMIN HCL ER 500 MG PO TB24
500.0000 mg | ORAL_TABLET | Freq: Every day | ORAL | 2 refills | Status: DC
  Filled 2021-09-06: qty 30, 30d supply, fill #0
  Filled 2021-10-10: qty 30, 30d supply, fill #1
  Filled 2021-11-08: qty 30, 30d supply, fill #2
  Filled 2021-12-12: qty 30, 30d supply, fill #3
  Filled 2022-01-17: qty 30, 30d supply, fill #4
  Filled 2022-02-06: qty 30, 30d supply, fill #5
  Filled 2022-03-13: qty 30, 30d supply, fill #6

## 2021-10-10 ENCOUNTER — Other Ambulatory Visit (HOSPITAL_COMMUNITY): Payer: Self-pay

## 2021-10-11 ENCOUNTER — Other Ambulatory Visit (HOSPITAL_COMMUNITY): Payer: Self-pay

## 2021-10-11 MED ORDER — OMEPRAZOLE 40 MG PO CPDR
DELAYED_RELEASE_CAPSULE | ORAL | 5 refills | Status: DC
  Filled 2021-10-11: qty 60, 30d supply, fill #0
  Filled 2021-11-07: qty 60, 30d supply, fill #1
  Filled 2021-12-12: qty 60, 30d supply, fill #2
  Filled 2022-01-17: qty 60, 30d supply, fill #3
  Filled 2022-02-06: qty 60, 30d supply, fill #4
  Filled 2022-03-16: qty 60, 30d supply, fill #5

## 2021-10-19 ENCOUNTER — Encounter: Payer: Self-pay | Admitting: Dermatology

## 2021-10-19 ENCOUNTER — Ambulatory Visit: Payer: 59 | Admitting: Dermatology

## 2021-10-19 ENCOUNTER — Other Ambulatory Visit: Payer: Self-pay

## 2021-10-19 DIAGNOSIS — Z1283 Encounter for screening for malignant neoplasm of skin: Secondary | ICD-10-CM | POA: Diagnosis not present

## 2021-10-19 DIAGNOSIS — D1801 Hemangioma of skin and subcutaneous tissue: Secondary | ICD-10-CM

## 2021-10-19 DIAGNOSIS — L814 Other melanin hyperpigmentation: Secondary | ICD-10-CM

## 2021-10-20 DIAGNOSIS — M25519 Pain in unspecified shoulder: Secondary | ICD-10-CM | POA: Insufficient documentation

## 2021-11-08 ENCOUNTER — Other Ambulatory Visit (HOSPITAL_COMMUNITY): Payer: Self-pay

## 2021-12-12 ENCOUNTER — Other Ambulatory Visit (HOSPITAL_COMMUNITY): Payer: Self-pay

## 2021-12-13 ENCOUNTER — Other Ambulatory Visit (HOSPITAL_COMMUNITY): Payer: Self-pay

## 2022-01-17 ENCOUNTER — Other Ambulatory Visit (HOSPITAL_COMMUNITY): Payer: Self-pay

## 2022-01-17 DIAGNOSIS — N926 Irregular menstruation, unspecified: Secondary | ICD-10-CM | POA: Insufficient documentation

## 2022-02-07 ENCOUNTER — Other Ambulatory Visit (HOSPITAL_COMMUNITY): Payer: Self-pay

## 2022-02-10 DIAGNOSIS — M25512 Pain in left shoulder: Secondary | ICD-10-CM | POA: Insufficient documentation

## 2022-02-15 ENCOUNTER — Other Ambulatory Visit (HOSPITAL_COMMUNITY): Payer: Self-pay

## 2022-03-08 IMAGING — CT CT ABD-PELV W/O CM
1 of 2 series · 14 of 32 positions shown, 19 images · non-contrast
Comparison: None.

CLINICAL DATA: 39-year-old female with upper abdominal pain.

EXAM:
CT ABDOMEN AND PELVIS WITHOUT CONTRAST
TECHNIQUE: Multidetector CT imaging of the abdomen and pelvis was performed
following the standard protocol without IV contrast.

[Series 2: abd/pelvis w/(date) · axial · 0.80mm/px · z∈[-438,-58]mm · 14 of 88 slices shown, 19 images]
[im 6/88  soft-tissue]
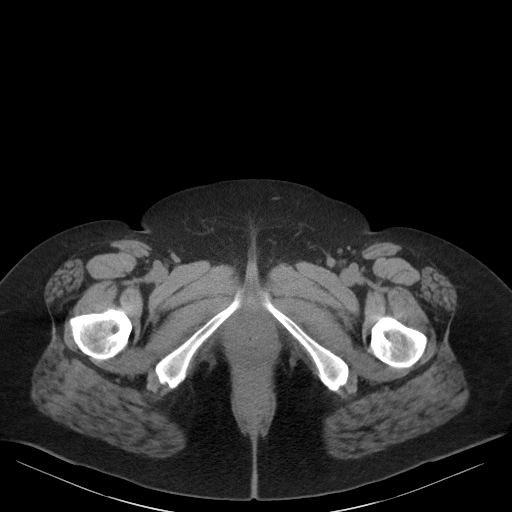
[im 6/88  bone]
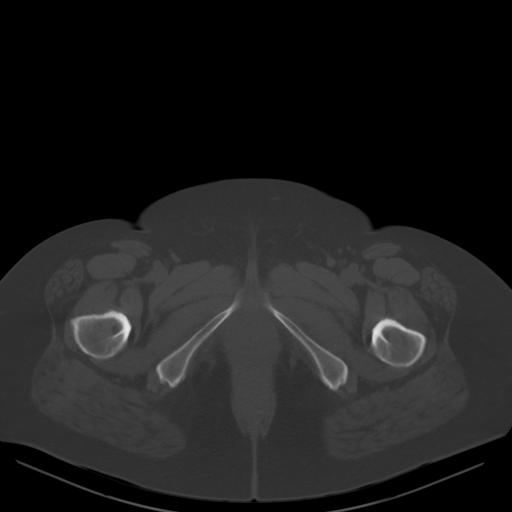
[im 11/88  soft-tissue]
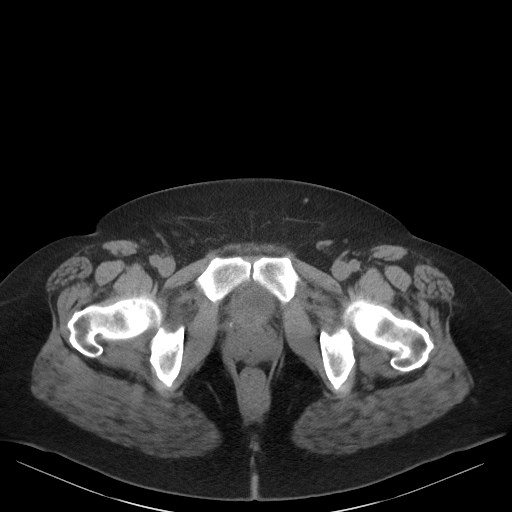
[im 21/88  soft-tissue]
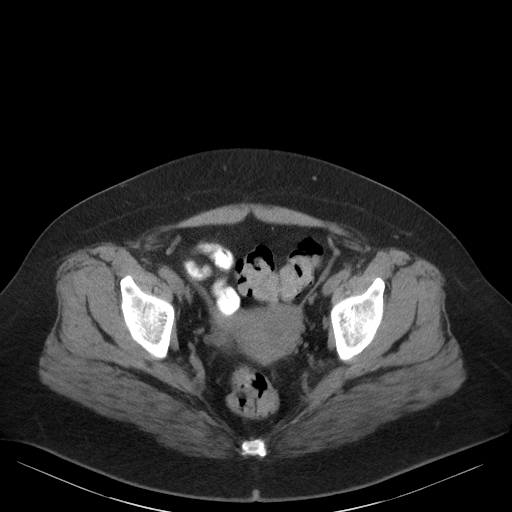
[im 26/88  soft-tissue]
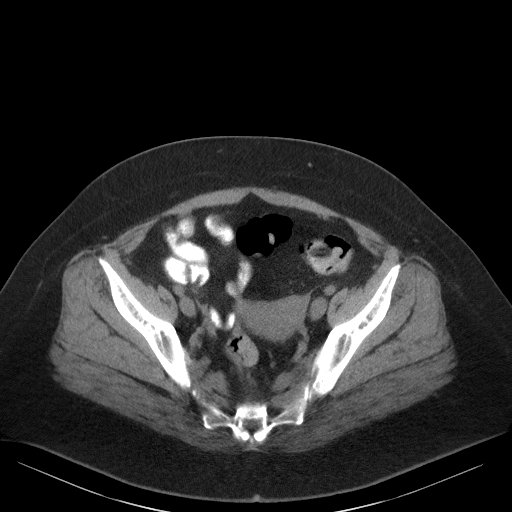
[im 31/88  soft-tissue]
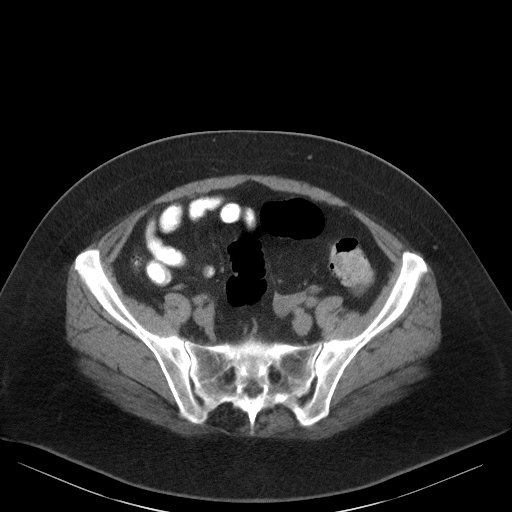
[im 36/88  soft-tissue]
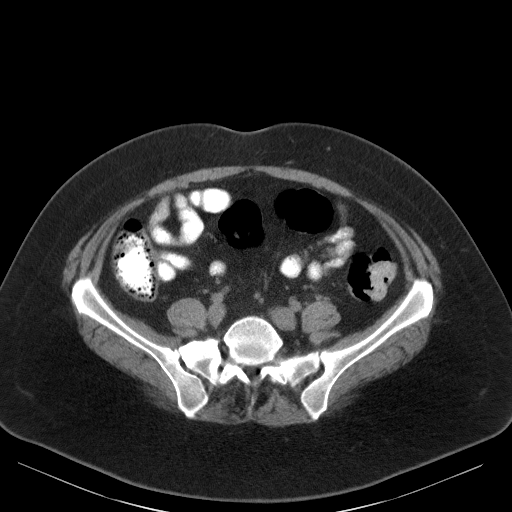
[im 47/88  soft-tissue]
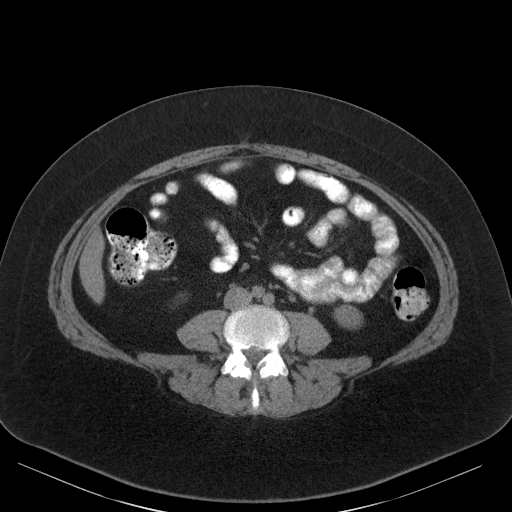
[im 52/88  soft-tissue]
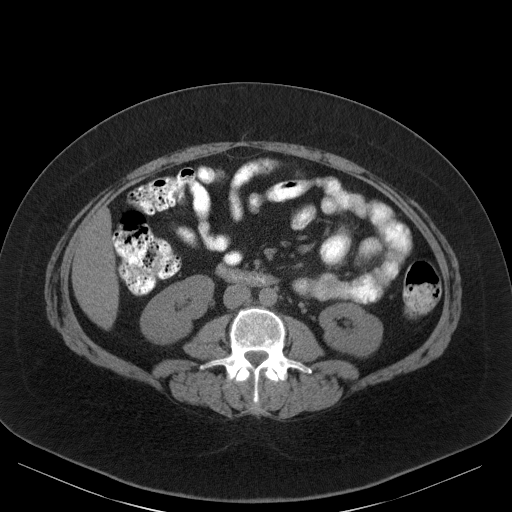
[im 57/88  soft-tissue]
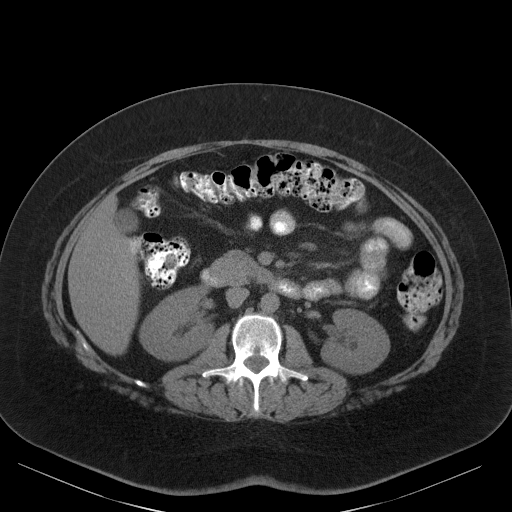
[im 57/88  bone]
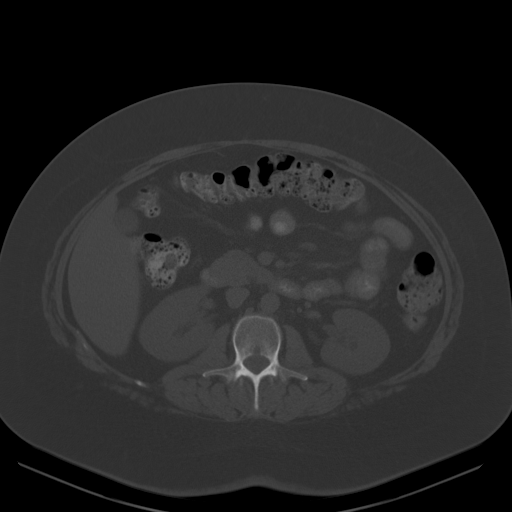
[im 62/88  soft-tissue]
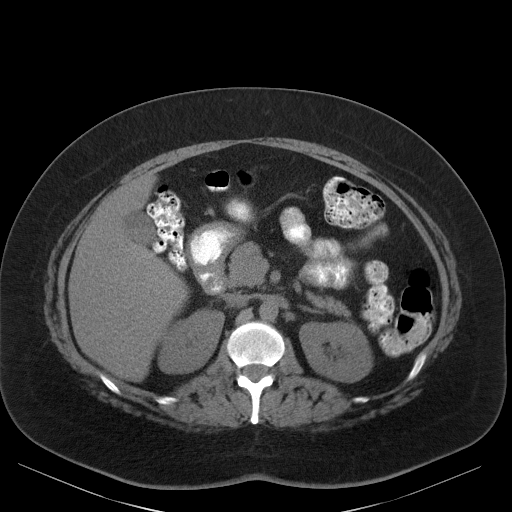
[im 67/88  soft-tissue]
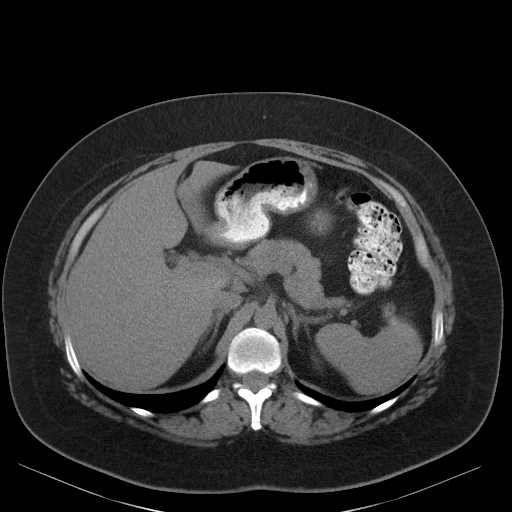
[im 67/88  lung]
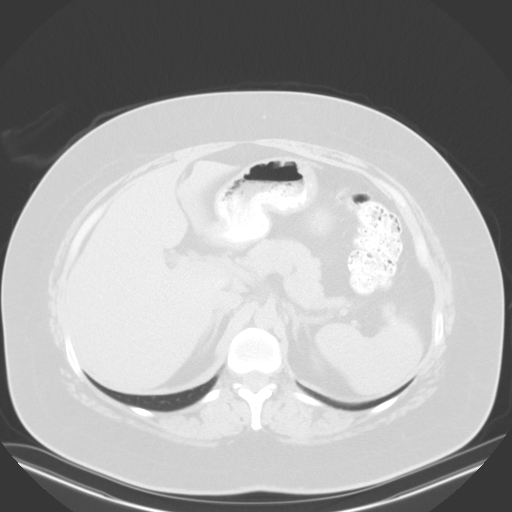
[im 72/88  lung]
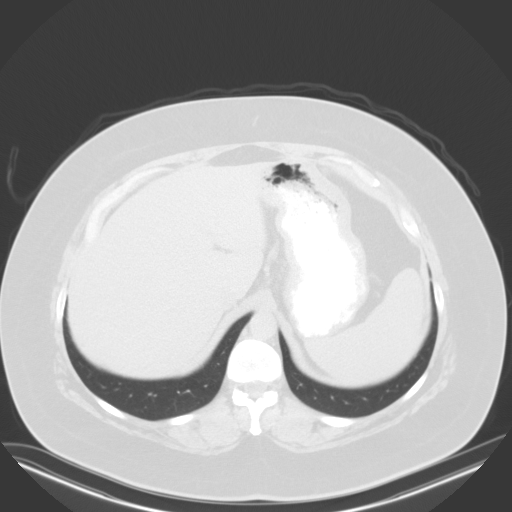
[im 77/88  soft-tissue]
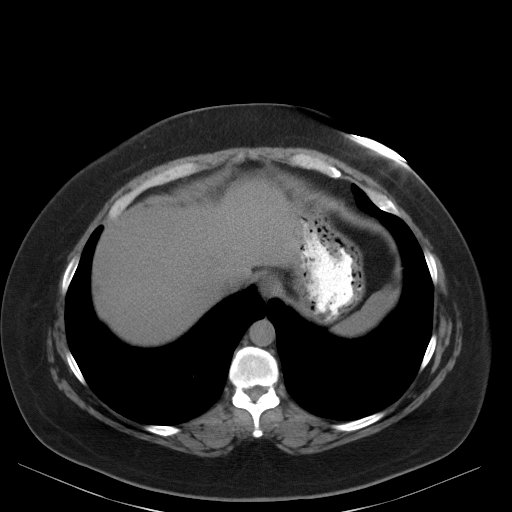
[im 77/88  lung]
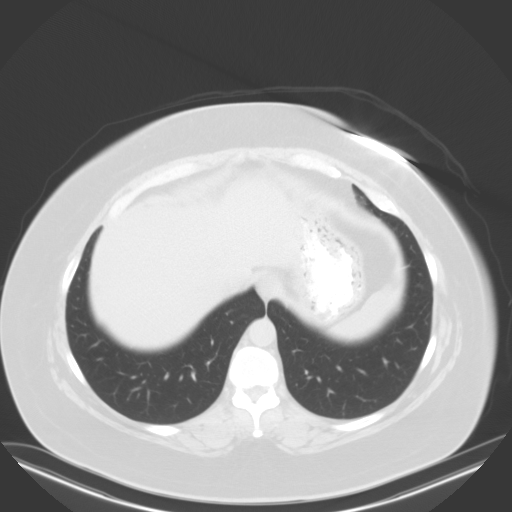
[im 82/88  soft-tissue]
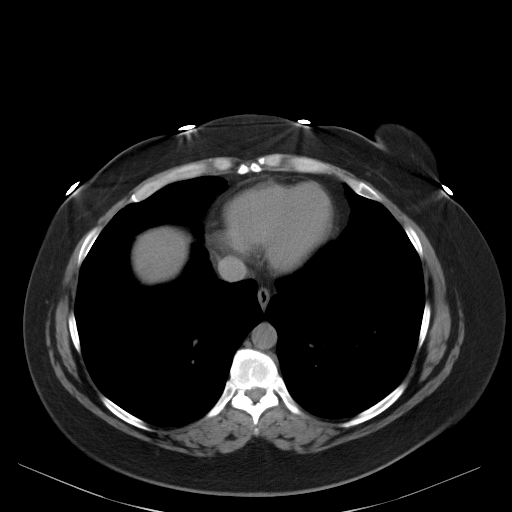
[im 82/88  lung]
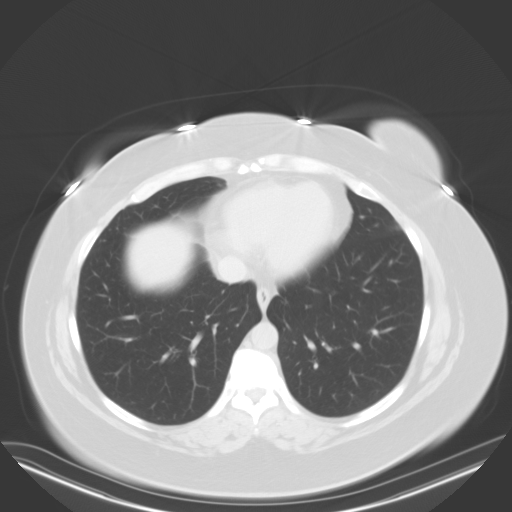

[14 of 32 positions shown; findings below may reference images not displayed]

FINDINGS: Evaluation of this exam is limited in the absence of intravenous
contrast.

Lower chest: The visualized lung bases are clear.

No intra-abdominal free air or free fluid.

Hepatobiliary: No focal liver abnormality is seen. No gallstones,
gallbladder wall thickening, or biliary dilatation.

Pancreas: Unremarkable. No pancreatic ductal dilatation or
surrounding inflammatory changes.

Spleen: Normal in size without focal abnormality.

Adrenals/Urinary Tract: The adrenal glands unremarkable. The
kidneys, visualized ureters, and urinary bladder appear
unremarkable.

Stomach/Bowel: There is no bowel obstruction or active inflammation.
The appendix is normal.

Vascular/Lymphatic: The abdominal aorta and IVC are grossly
unremarkable on this noncontrast CT. No portal venous gas. There is
no adenopathy.

Reproductive: The uterus is anteverted. Probable arcuate or
bicornuate uterine morphology. No adnexal masses.

Other: None

Musculoskeletal: No acute or significant osseous findings.
IMPRESSION: No acute intra-abdominal or pelvic pathology. No bowel obstruction.
Normal appendix.

## 2022-03-14 ENCOUNTER — Other Ambulatory Visit (HOSPITAL_COMMUNITY): Payer: Self-pay

## 2022-03-17 ENCOUNTER — Other Ambulatory Visit (HOSPITAL_COMMUNITY): Payer: Self-pay

## 2022-03-18 ENCOUNTER — Other Ambulatory Visit (HOSPITAL_COMMUNITY): Payer: Self-pay

## 2022-03-19 ENCOUNTER — Other Ambulatory Visit (HOSPITAL_COMMUNITY): Payer: Self-pay

## 2022-04-06 DIAGNOSIS — M545 Low back pain, unspecified: Secondary | ICD-10-CM | POA: Insufficient documentation

## 2022-04-08 ENCOUNTER — Other Ambulatory Visit (HOSPITAL_COMMUNITY): Payer: Self-pay

## 2022-04-08 MED ORDER — ATORVASTATIN CALCIUM 20 MG PO TABS
ORAL_TABLET | ORAL | 3 refills | Status: DC
Start: 2022-04-08 — End: 2022-05-12
  Filled 2022-04-08: qty 30, 30d supply, fill #0

## 2022-04-08 MED ORDER — METFORMIN HCL ER 500 MG PO TB24
500.0000 mg | ORAL_TABLET | Freq: Every day | ORAL | 0 refills | Status: DC
  Filled 2022-04-08 – 2022-04-14 (×2): qty 30, 30d supply, fill #0
  Filled 2022-05-14: qty 30, 30d supply, fill #1
  Filled 2022-06-12: qty 30, 30d supply, fill #2

## 2022-04-11 ENCOUNTER — Other Ambulatory Visit (HOSPITAL_COMMUNITY): Payer: Self-pay

## 2022-04-11 MED ORDER — OMEPRAZOLE 40 MG PO CPDR
DELAYED_RELEASE_CAPSULE | ORAL | 5 refills | Status: DC
  Filled 2022-04-11: qty 60, 30d supply, fill #0
  Filled 2022-05-14: qty 60, 30d supply, fill #1
  Filled 2022-06-12: qty 60, 30d supply, fill #2
  Filled 2022-07-08: qty 60, 30d supply, fill #3
  Filled 2022-08-09: qty 60, 30d supply, fill #4
  Filled 2022-08-26 – 2022-09-17 (×2): qty 60, 30d supply, fill #5

## 2022-04-12 ENCOUNTER — Other Ambulatory Visit (HOSPITAL_COMMUNITY): Payer: Self-pay

## 2022-04-14 ENCOUNTER — Other Ambulatory Visit (HOSPITAL_COMMUNITY): Payer: Self-pay

## 2022-04-15 ENCOUNTER — Other Ambulatory Visit (HOSPITAL_COMMUNITY): Payer: Self-pay

## 2022-04-18 ENCOUNTER — Other Ambulatory Visit (HOSPITAL_COMMUNITY): Payer: Self-pay

## 2022-05-03 ENCOUNTER — Other Ambulatory Visit (HOSPITAL_COMMUNITY): Payer: Self-pay

## 2022-05-03 MED ORDER — SULFAMETHOXAZOLE-TRIMETHOPRIM 800-160 MG PO TABS
ORAL_TABLET | ORAL | 0 refills | Status: DC
  Filled 2022-05-03: qty 14, 7d supply, fill #0

## 2022-05-05 ENCOUNTER — Encounter: Payer: Self-pay | Admitting: Dermatology

## 2022-05-05 NOTE — Progress Notes (Signed)
   Follow-Up Visit   Subjective  Christy Alexander is a 41 y.o. female who presents for the following: Annual Exam (No new concerns- left lower leg- ? Freckle- just want checked- no changes).  General skin examination Location:  Duration:  Quality:  Associated Signs/Symptoms: Modifying Factors:  Severity:  Timing: Context:   Objective  Well appearing patient in no apparent distress; mood and affect are within normal limits. General skin examination, no atypical pigmented lesions (all checked with dermoscopy), no nonmelanoma skin cancer  Left Shoulder - Anterior Smooth 1 mm red dermal papule  Left Lower Leg - Anterior 4 mm light brown symmetric macule, no dermoscopic atypia    A full examination was performed including scalp, head, eyes, ears, nose, lips, neck, chest, axillae, abdomen, back, buttocks, bilateral upper extremities, bilateral lower extremities, hands, feet, fingers, toes, fingernails, and toenails. All findings within normal limits unless otherwise noted below.  Areas beneath undergarments not fully examined.   Assessment & Plan    Cherry angioma Left Shoulder - Anterior  No intervention necessary  Lentigo Left Lower Leg - Anterior  Recheck if there is change in size, color, shape, surface.  Encounter for screening for malignant neoplasm of skin  Annual skin examination, patient encouraged to self examine twice annually.      I, Lavonna Monarch, MD, have reviewed all documentation for this visit.  The documentation on 05/05/22 for the exam, diagnosis, procedures, and orders are all accurate and complete.

## 2022-05-12 ENCOUNTER — Encounter: Payer: Self-pay | Admitting: Cardiovascular Disease

## 2022-05-12 ENCOUNTER — Other Ambulatory Visit (HOSPITAL_COMMUNITY): Payer: Self-pay

## 2022-05-12 ENCOUNTER — Ambulatory Visit: Payer: 59 | Admitting: Cardiovascular Disease

## 2022-05-12 VITALS — BP 122/88 | HR 71 | Ht 64.0 in | Wt 223.6 lb

## 2022-05-12 DIAGNOSIS — R0602 Shortness of breath: Secondary | ICD-10-CM | POA: Diagnosis not present

## 2022-05-12 DIAGNOSIS — R072 Precordial pain: Secondary | ICD-10-CM

## 2022-05-12 DIAGNOSIS — R079 Chest pain, unspecified: Secondary | ICD-10-CM | POA: Diagnosis not present

## 2022-05-12 MED ORDER — DIPHENHYDRAMINE HCL 50 MG PO CAPS
ORAL_CAPSULE | ORAL | 0 refills | Status: DC
  Filled 2022-05-12: qty 1, fill #0

## 2022-05-12 MED ORDER — PREDNISONE 50 MG PO TABS
ORAL_TABLET | ORAL | 0 refills | Status: DC
  Filled 2022-05-12: qty 3, 1d supply, fill #0

## 2022-05-12 MED ORDER — METOPROLOL TARTRATE 100 MG PO TABS
100.0000 mg | ORAL_TABLET | Freq: Once | ORAL | 0 refills | Status: DC
  Filled 2022-05-12: qty 1, 1d supply, fill #0

## 2022-05-12 NOTE — Progress Notes (Signed)
Chief Complaint  Patient presents with   New Patient (Initial Visit)    Chest pain   History of Present Illness: 41 yo female with his of anxiety, GERD and hyperlipidemia who is here today as a new patient for the evaluation of chest pain. She tells me today that she has had two episodes of chest pain. Both episodes occurred at rest. The pain was in the center of her chest and described as tightness that lasted for six hours. No associated dyspnea, diaphoresis. No exertional chest pain. Her mother and father have heart disease, both with CAD.  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   Primary Care Physician: Clinton Quant, MD   Past Medical History:  Diagnosis Date   Anxiety    Back pain at L4-L5 level    BMI 30.0-30.9,adult    Chest pain    Constipation    Delayed pressure urticaria dx age 73   trauma to skin causes deep tissue  swelling   GERD (gastroesophageal reflux disease)    Hyperglycemia    Hyperlipidemia    Laryngopharyngeal reflux    Lower back pain    l4 to l5   Obesity    Right thyroid nodule    Seasonal allergies    TMJ (dislocation of temporomandibular joint)    Urticaria    Vitamin D deficiency     Past Surgical History:  Procedure Laterality Date   LUMBAR LAMINECTOMY  2006   L4 and L5 discectomy micro, hemilaminectomy, had conscious sedation for    THYROID LOBECTOMY Right 02/01/2018   Procedure: RIGHT THYROID LOBECTOMY;  Surgeon: Armandina Gemma, MD;  Location: [facility];  Service: General;  Laterality: Right;   WISDOM TOOTH EXTRACTION  2001    Current Outpatient Medications  Medication Sig Dispense Refill   acetaminophen (TYLENOL) 500 MG tablet Take 1,000 mg by mouth every 6 (six) hours as needed for headache.      Ascorbic Acid (VITAMIN C) 1000 MG tablet Take 1,000 mg by mouth at bedtime.     cetirizine (ZYRTEC) 10 MG tablet Take 10 mg by mouth at bedtime.      diphenhydrAMINE (BENADRYL) 2 % cream Apply 1 application topically 3 (three) times daily as  needed (for hives.).     diphenhydrAMINE (BENADRYL) 25 mg capsule Take 25-50 mg by mouth every 6 (six) hours as needed (for hives.).      diphenhydrAMINE (BENADRYL) 50 MG capsule Take one capsule 1 hour prior to scan. 1 capsule 0   Lidocaine 4 % KIT Apply topically daily at 6 (six) AM.     meloxicam (MOBIC) 7.5 MG tablet Take 1 tablet by mouth as needed as directed 90 tablet 3   metFORMIN (GLUCOPHAGE-XR) 500 MG 24 hr tablet Take 500 mg by mouth daily.     metoprolol tartrate (LOPRESSOR) 100 MG tablet Take 1 tablet (100 mg total) by mouth once for 1 dose. Take 90-120 minutes prior to scan. 1 tablet 0   omeprazole (PRILOSEC) 40 MG capsule TAKE 1 CAPSULE BY MOUTH TWICE DAILY BEFORE A MEAL 60 capsule 5   predniSONE (DELTASONE) 50 MG tablet Take one tablet 13 hours, 7 hours, and 1 hour prior to scan. 3 tablet 0   VITAMIN D PO Take 1,000 Units by mouth daily.     No current facility-administered medications for this visit.    Allergies  Allergen Reactions   Aspirin Swelling    hoarseness   Chlorhexidine Dermatitis   Contrast Media [Iodinated  Contrast Media] Hives and Itching   Esomeprazole Hives   Nsaids Other (See Comments)    Eyes swelling/loss of voice    Social History   Socioeconomic History   Marital status: Married    Spouse name: Not on file   Number of children: 0   Years of education: college   Highest education level: Master's degree (e.g., MA, MS, MEng, MEd, MSW, MBA)  Occupational History   Occupation: Therapist, sports   Occupation: Marine scientist works for [employer]  Tobacco Use   Smoking status: Never   Smokeless tobacco: Never  Scientific laboratory technician Use: Never used  Substance and Sexual Activity   Alcohol use: No   Drug use: No   Sexual activity: Not on file  Other Topics Concern   Not on file  Social History Narrative   Lives at home with her husband.   Right-handed.   1 cup coffee per day.   Social Determinants of Health   Financial Resource Strain: Not on file  Food  Insecurity: No Food Insecurity (07/19/2018)   Hunger Vital Sign    Worried About Running Out of Food in the Last Year: Never true    Ran Out of Food in the Last Year: Never true  Transportation Needs: Not on file  Physical Activity: Not on file  Stress: Not on file  Social Connections: Not on file  Intimate Partner Violence: Not on file    Family History  Problem Relation Age of Onset   Diabetes type II Mother    Heart disease Mother        CHF/CAD   Sleep apnea Mother    Tongue cancer Father    Glaucoma Father    Heart disease Father        CAD   Diabetes type II Brother    Cataracts Brother    Heart attack Maternal Grandmother    Heart disease Maternal Grandmother    Other Maternal Grandfather        worked in [place]   Colon cancer Paternal Grandmother    Diabetes Paternal Grandfather    Heart disease Paternal Grandfather    Allergic rhinitis Neg Hx    Angioedema Neg Hx    Asthma Neg Hx    Eczema Neg Hx    Immunodeficiency Neg Hx    Urticaria Neg Hx     Review of Systems:  As stated in the HPI and otherwise negative.   BP 122/88   Pulse 71   Ht 5' 4"  (1.626 m)   Wt 223 lb 9.6 oz (101.4 kg)   SpO2 97%   BMI 38.38 kg/m   Physical Examination: General: Well developed, well nourished, NAD  HEENT: OP clear, mucus membranes moist  SKIN: warm, dry. No rashes. Neuro: No focal deficits  Musculoskeletal: Muscle strength 5/5 all ext  Psychiatric: Mood and affect normal  Neck: No JVD, no carotid bruits, no thyromegaly, no lymphadenopathy.  Lungs:Clear bilaterally, no wheezes, rhonci, crackles Cardiovascular: Regular rate and rhythm. No murmurs, gallops or rubs. Abdomen:Soft. Bowel sounds present. Non-tender.  Extremities: No lower extremity edema. Pulses are 2 + in the bilateral DP/PT.  EKG:  EKG is ordered today. The ekg ordered today demonstrates Sinus  Recent Labs: No results found for requested labs within last 365 days.   Lipid Panel No results found  for: "CHOL", "TRIG", "HDL", "CHOLHDL", "VLDL", "LDLCALC", "LDLDIRECT"   Wt Readings from Last 3 Encounters:  05/12/22 223 lb 9.6 oz (101.4 kg)  11/11/20 224  lb (101.6 kg)  10/15/20 227 lb 6.4 oz (103.1 kg)      Assessment and Plan:   1. Chest pain: Her pain is mostly atypical but given strong FH of CAD and personal history of HLD, will arrange an coronary CTA to exclude CAD. Echo to assess LV function and exclude structural heart disease. Pre-treatment for prior contrast allergy. Beta blocker morning of CTA.   Labs/ tests ordered today include:   Orders Placed This Encounter  Procedures   CT CORONARY MORPH W/CTA COR W/SCORE W/CA W/CM &/OR WO/CM   EKG 12-Lead   ECHOCARDIOGRAM COMPLETE   Disposition:   F/U with me in 6 weeks  Signed, Lauree Chandler, MD 05/12/2022 10:18 AM    [facility] [place], [place], [place]  [address] Phone: [phone]; Fax: [phone]

## 2022-05-12 NOTE — Patient Instructions (Signed)
Medication Instructions:  No changes *If you need a refill on your cardiac medications before your next appointment, please call your pharmacy*   Lab Work: No changes If you have labs (blood work) drawn today and your tests are completely normal, you will receive your results only by: [place] (if you have MyChart) OR A paper copy in the mail If you have any lab test that is abnormal or we need to change your treatment, we will call you to review the results.   Testing/Procedures: Your physician has requested that you have an echocardiogram. Echocardiography is a painless test that uses sound waves to create images of your heart. It provides your doctor with information about the size and shape of your heart and how well your heart's chambers and valves are working. This procedure takes approximately one hour. There are no restrictions for this procedure.  Cardiac CTA - see instructions below   Follow-Up: At [facility], you and your health needs are our priority.  As part of our continuing mission to provide you with exceptional heart care, we have created designated Provider Care Teams.  These Care Teams include your primary Cardiologist (physician) and Advanced Practice Providers (APPs -  Physician Assistants and Nurse Practitioners) who all work together to provide you with the care you need, when you need it.   Your next appointment:   6 week(s)  The format for your next appointment:   In Person  Provider:   Lauree Chandler, MD   Other Instructions   Your cardiac CT will be scheduled at one of the below locations:   [facility] [address] [place], [place] [address] [phone]   Please arrive at the [facility] Entrance (Entrance C2) of [facility] 30 minutes prior to test start time. You can use the FREE valet parking offered at entrance C (encouraged to control the heart rate for the test)  Proceed to the [facility] Radiology Department (first floor) to check-in and test prep.  All radiology patients and guests should use entrance C2 at [facility], accessed from [facility], even though the hospital's physical address listed is [address].    Please follow these instructions carefully (unless otherwise directed):   On the Night Before the Test: Be sure to Drink plenty of water. Do not consume any caffeinated/decaffeinated beverages or chocolate 12 hours prior to your test. Do not take any antihistamines 12 hours prior to your test. If the patient has contrast allergy: Patient will need a prescription for Prednisone and very clear instructions (as follows): Prednisone 50 mg - take 13 hours prior to test Take another Prednisone 50 mg 7 hours prior to test Take another Prednisone 50 mg 1 hour prior to test Take Benadryl 50 mg 1 hour prior to test Patient must complete all four doses of above prophylactic medications. Patient will need a ride after test due to Benadryl.  On the Day of the Test: Drink plenty of water until 1 hour prior to the test. Do not eat any food 4 hours prior to the test. You may take your regular medications prior to the test.  Take metoprolol (Lopressor) two hours prior to test. FEMALES- please wear underwire-free bra if available, avoid dresses & tight clothing       After the Test: Drink plenty of water. After receiving IV contrast, you may experience a mild flushed feeling. This is normal. On occasion, you may experience a  mild rash up to 24 hours after the test. This is not dangerous. If this occurs, you can take Benadryl 25 mg and increase your fluid intake. If you experience trouble breathing, this can be serious. If it is severe call 911 IMMEDIATELY. If it is mild, please call our office. If you take any of these medications: Glipizide/Metformin, Avandament, Glucavance, please do not take 48 hours after completing test unless  otherwise instructed.  We will call to schedule your test 2-4 weeks out understanding that some insurance companies will need an authorization prior to the service being performed.   For non-scheduling related questions, please contact the cardiac imaging nurse navigator should you have any questions/concerns: Marchia Bond, Cardiac Imaging Nurse Navigator Gordy Clement, Cardiac Imaging Nurse Navigator [facility] Direct Office Dial: [phone]   For scheduling needs, including cancellations and rescheduling, please call [place], [phone].

## 2022-05-14 ENCOUNTER — Other Ambulatory Visit (HOSPITAL_COMMUNITY): Payer: Self-pay

## 2022-05-27 ENCOUNTER — Ambulatory Visit (HOSPITAL_COMMUNITY): Payer: 59 | Attending: Cardiovascular Disease

## 2022-05-27 DIAGNOSIS — R072 Precordial pain: Secondary | ICD-10-CM

## 2022-05-27 DIAGNOSIS — R0602 Shortness of breath: Secondary | ICD-10-CM | POA: Diagnosis not present

## 2022-05-27 DIAGNOSIS — R079 Chest pain, unspecified: Secondary | ICD-10-CM

## 2022-05-27 LAB — ECHOCARDIOGRAM COMPLETE
Area-P 1/2: 4.49 cm2
S' Lateral: 2.6 cm

## 2022-05-27 MED ORDER — PERFLUTREN LIPID MICROSPHERE
1.0000 mL | INTRAVENOUS | Status: AC | PRN
  Administered 2022-05-27: 2 mL via INTRAVENOUS

## 2022-05-30 ENCOUNTER — Telehealth (HOSPITAL_COMMUNITY): Payer: Self-pay | Admitting: Emergency Medicine

## 2022-05-30 NOTE — Telephone Encounter (Signed)
Reaching out to patient to offer assistance regarding upcoming cardiac imaging study; pt verbalizes understanding of appt date/time, parking situation and where to check in, pre-test NPO status and medications ordered, and verified current allergies; name and call back number provided for further questions should they arise Christy Bond RN Navigator Cardiac Imaging [facility] [phone] office [phone] cell  Pt denies questions about CT instructions, was quick to get off phone, states " I got labs a few weeks ago at my PCP, you can get them from there"   Will call PCP to obtain recent kidney labs due to taking metformin.   Christy Alexander

## 2022-05-31 ENCOUNTER — Ambulatory Visit (HOSPITAL_COMMUNITY)
Admission: RE | Admit: 2022-05-31 | Discharge: 2022-05-31 | Disposition: A | Discharge status: Home / Self Care | Payer: 59 | Source: Ambulatory Visit | Attending: Cardiovascular Disease | Admitting: Cardiovascular Disease

## 2022-05-31 DIAGNOSIS — R072 Precordial pain: Secondary | ICD-10-CM | POA: Insufficient documentation

## 2022-05-31 MED ORDER — METOPROLOL TARTRATE 5 MG/5ML IV SOLN
INTRAVENOUS | Status: AC
  Administered 2022-05-31: 5 mg via INTRAVENOUS
  Filled 2022-05-31: qty 5

## 2022-05-31 MED ORDER — NITROGLYCERIN 0.4 MG SL SUBL
0.8000 mg | SUBLINGUAL_TABLET | Freq: Once | SUBLINGUAL | Status: AC
  Administered 2022-05-31: 0.8 mg via SUBLINGUAL

## 2022-05-31 MED ORDER — METOPROLOL TARTRATE 5 MG/5ML IV SOLN: 5.0000 mg | Freq: Once | INTRAVENOUS | Status: AC

## 2022-05-31 MED ORDER — IOHEXOL 350 MG/ML SOLN
95.0000 mL | Freq: Once | INTRAVENOUS | Status: AC | PRN
  Administered 2022-05-31: 95 mL via INTRAVENOUS

## 2022-05-31 MED ORDER — NITROGLYCERIN 0.4 MG SL SUBL
SUBLINGUAL_TABLET | SUBLINGUAL | Status: AC
  Filled 2022-05-31: qty 2

## 2022-06-03 ENCOUNTER — Telehealth: Payer: Self-pay | Admitting: Cardiovascular Disease

## 2022-06-03 DIAGNOSIS — R079 Chest pain, unspecified: Secondary | ICD-10-CM

## 2022-06-03 DIAGNOSIS — R0602 Shortness of breath: Secondary | ICD-10-CM

## 2022-06-03 NOTE — Telephone Encounter (Signed)
Spoke with the patient and scheduled labs.

## 2022-06-03 NOTE — Telephone Encounter (Signed)
Spoke with the patient who states that she has noticed some swelling in her face and tightness in her hands since yesterday. She states that she is also not urinating as much as she normally does. She states that she has been drinking her normal amount of fluids. She also reports that food that she normally eats has been tasting off. She states that she has not eaten more salt that usual. She is concerned that the contrast dye that she received for her CT scan has damaged her kidneys and is wondering if she needs to have some labs drawn.

## 2022-06-03 NOTE — Telephone Encounter (Signed)
Pt c/o swelling: STAT is pt has developed SOB within 24 hours  If swelling, where is the swelling located?  Face  How much weight have you gained and in what time span?   No  Have you gained 3 pounds in a day or 5 pounds in a week?  No  Do you have a log of your daily weights (if so, list)?  No  Are you currently taking a fluid pill? No  Are you currently SOB? No  Have you traveled recently?  No   Patient called stated she is not urinating as much as she normally would since she had test on Tuesday pm.  Patient stated her hands feel tight and is a little puffy as though she has eaten something salty.  Patient stated her food last night tasted strange.

## 2022-06-06 ENCOUNTER — Other Ambulatory Visit: Payer: 59

## 2022-06-13 ENCOUNTER — Other Ambulatory Visit (HOSPITAL_COMMUNITY): Payer: Self-pay

## 2022-06-23 DIAGNOSIS — M5126 Other intervertebral disc displacement, lumbar region: Secondary | ICD-10-CM | POA: Insufficient documentation

## 2022-06-24 ENCOUNTER — Encounter: Payer: Self-pay | Admitting: Cardiovascular Disease

## 2022-06-24 ENCOUNTER — Ambulatory Visit: Payer: 59 | Attending: Cardiovascular Disease | Admitting: Cardiovascular Disease

## 2022-06-24 VITALS — BP 118/86 | HR 82 | Ht 64.0 in | Wt 230.6 lb

## 2022-06-24 DIAGNOSIS — R079 Chest pain, unspecified: Secondary | ICD-10-CM

## 2022-06-24 NOTE — Progress Notes (Signed)
Chief Complaint  Patient presents with   Follow-up    Chest pain   History of Present Illness: 41 yo female with his of anxiety, GERD and hyperlipidemia who is here today for cardiac follow up. I saw her as a new patient for the evaluation of chest pain on 05/12/22. She described two episodes of chest pain. Both episodes occurred at rest. The pain was in the center of her chest and described as tightness that lasted for six hours. No associated dyspnea, diaphoresis. No exertional chest pain. Her mother and father have heart disease, both with CAD. 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 Coronary CTA 05/31/22 with calcium score zero and no evidence of CAD. Echo 05/27/22 with LVEF=60-65%. No valve disease.   She is here today for follow up. The patient denies any chest pain, dyspnea, palpitations, lower extremity edema, orthopnea, PND, dizziness, near syncope or syncope.   Primary Care Physician: Clinton Quant, MD   Past Medical History:  Diagnosis Date   Anxiety    Back pain at L4-L5 level    BMI 30.0-30.9,adult    Chest pain    Constipation    Delayed pressure urticaria dx age 30   trauma to skin causes deep tissue  swelling   GERD (gastroesophageal reflux disease)    Hyperglycemia    Hyperlipidemia    Laryngopharyngeal reflux    Lower back pain    l4 to l5   Obesity    Right thyroid nodule    Seasonal allergies    TMJ (dislocation of temporomandibular joint)    Urticaria    Vitamin D deficiency     Past Surgical History:  Procedure Laterality Date   LUMBAR LAMINECTOMY  2006   L4 and L5 discectomy micro, hemilaminectomy, had conscious sedation for    THYROID LOBECTOMY Right 02/01/2018   Procedure: RIGHT THYROID LOBECTOMY;  Surgeon: Armandina Gemma, MD;  Location: [facility];  Service: General;  Laterality: Right;   WISDOM TOOTH EXTRACTION  2001    Current Outpatient Medications  Medication Sig Dispense Refill   acetaminophen (TYLENOL) 500 MG tablet Take 1,000 mg by mouth every 6  (six) hours as needed for headache.      Ascorbic Acid (VITAMIN C) 1000 MG tablet Take 1,000 mg by mouth at bedtime.     cetirizine (ZYRTEC) 10 MG tablet Take 10 mg by mouth at bedtime.      diphenhydrAMINE (BENADRYL) 2 % cream Apply 1 application topically 3 (three) times daily as needed (for hives.).     diphenhydrAMINE (BENADRYL) 25 mg capsule Take 25-50 mg by mouth every 6 (six) hours as needed (for hives.).      Lidocaine 4 % KIT Apply topically daily at 6 (six) AM.     meloxicam (MOBIC) 7.5 MG tablet Take 1 tablet by mouth as needed as directed 90 tablet 3   metFORMIN (GLUCOPHAGE-XR) 500 MG 24 hr tablet Take 500 mg by mouth daily.     metFORMIN (GLUCOPHAGE-XR) 500 MG 24 hr tablet TAKE 1 TABLET BY MOUTH ONCE DAILY.  PATIENT NEEDS TO SCHEDULE APPT WITH MD 90 tablet 0   omeprazole (PRILOSEC) 40 MG capsule TAKE 1 CAPSULE BY MOUTH TWICE DAILY BEFORE A MEAL 60 capsule 5   VITAMIN D PO Take 1,000 Units by mouth daily.     No current facility-administered medications for this visit.    Allergies  Allergen Reactions   Aspirin Swelling    hoarseness   Chlorhexidine Dermatitis   Contrast  Media [Iodinated Contrast Media] Hives and Itching   Esomeprazole Hives   Nsaids Other (See Comments)    Eyes swelling/loss of voice    Social History   Socioeconomic History   Marital status: Married    Spouse name: Not on file   Number of children: 0   Years of education: college   Highest education level: Master's degree (e.g., MA, MS, MEng, MEd, MSW, MBA)  Occupational History   Occupation: Therapist, sports   Occupation: Marine scientist works for [employer]  Tobacco Use   Smoking status: Never   Smokeless tobacco: Never  Scientific laboratory technician Use: Never used  Substance and Sexual Activity   Alcohol use: No   Drug use: No   Sexual activity: Not on file  Other Topics Concern   Not on file  Social History Narrative   Lives at home with her husband.   Right-handed.   1 cup coffee per day.   Social Determinants of  Health   Financial Resource Strain: Not on file  Food Insecurity: No Food Insecurity (07/19/2018)   Hunger Vital Sign    Worried About Running Out of Food in the Last Year: Never true    Ran Out of Food in the Last Year: Never true  Transportation Needs: Not on file  Physical Activity: Not on file  Stress: Not on file  Social Connections: Not on file  Intimate Partner Violence: Not on file    Family History  Problem Relation Age of Onset   Diabetes type II Mother    Heart disease Mother        CHF/CAD   Sleep apnea Mother    Tongue cancer Father    Glaucoma Father    Heart disease Father        CAD   Diabetes type II Brother    Cataracts Brother    Heart attack Maternal Grandmother    Heart disease Maternal Grandmother    Other Maternal Grandfather        worked in [place]   Colon cancer Paternal Grandmother    Diabetes Paternal Grandfather    Heart disease Paternal Grandfather    Allergic rhinitis Neg Hx    Angioedema Neg Hx    Asthma Neg Hx    Eczema Neg Hx    Immunodeficiency Neg Hx    Urticaria Neg Hx     Review of Systems:  As stated in the HPI and otherwise negative.   BP 118/86   Pulse 82   Ht 5' 4"  (1.626 m)   Wt 230 lb 9.6 oz (104.6 kg)   SpO2 98%   BMI 39.58 kg/m   Physical Examination: General: Well developed, well nourished, NAD  HEENT: OP clear, mucus membranes moist  SKIN: warm, dry. No rashes. Neuro: No focal deficits  Musculoskeletal: Muscle strength 5/5 all ext  Psychiatric: Mood and affect normal  Neck: No JVD, no carotid bruits, no thyromegaly, no lymphadenopathy.  Lungs:Clear bilaterally, no wheezes, rhonci, crackles Cardiovascular: Regular rate and rhythm. No murmurs, gallops or rubs. Abdomen:Soft. Bowel sounds present. Non-tender.  Extremities: No lower extremity edema. Pulses are 2 + in the bilateral DP/PT.  EKG:  EKG is not ordered today. The ekg ordered today demonstrates   Echo 05/27/22:  1. Left ventricular ejection  fraction, by estimation, is 60 to 65%. The  left ventricle has normal function. The left ventricle has no regional  wall motion abnormalities. Left ventricular diastolic parameters were  normal.   2. Right ventricular  systolic function is normal. The right ventricular  size is normal.   3. The mitral valve is normal in structure. No evidence of mitral valve  regurgitation. No evidence of mitral stenosis.   4. The aortic valve is tricuspid. Aortic valve regurgitation is not  visualized. No aortic stenosis is present.   5. The inferior vena cava is normal in size with greater than 50%  respiratory variability, suggesting right atrial pressure of 3 mmHg.   Recent Labs: No results found for requested labs within last 365 days.   Lipid Panel No results found for: "CHOL", "TRIG", "HDL", "CHOLHDL", "VLDL", "LDLCALC", "LDLDIRECT"   Wt Readings from Last 3 Encounters:  06/24/22 230 lb 9.6 oz (104.6 kg)  05/12/22 223 lb 9.6 oz (101.4 kg)  11/11/20 224 lb (101.6 kg)    Assessment and Plan:   1. Chest pain: Her chest pain is not cardiac related. Coronary CTA with no evidence of CAD. Echo is normal. No further cardiac workup is indicated.    Labs/ tests ordered today include:  No orders of the defined types were placed in this encounter.  Disposition:   F/U with me as needed.   Signed, Lauree Chandler, MD 06/24/2022 3:54 PM    [facility] [address], [place], [place]  [address] Phone: [phone]; Fax: [phone]

## 2022-06-24 NOTE — Patient Instructions (Signed)
Medication Instructions:  No changes *If you need a refill on your cardiac medications before your next appointment, please call your pharmacy*   Lab Work: none If you have labs (blood work) drawn today and your tests are completely normal, you will receive your results only by: [place] (if you have MyChart) OR A paper copy in the mail If you have any lab test that is abnormal or we need to change your treatment, we will call you to review the results.   Testing/Procedures: none   Follow-Up: As needed  Important Information About Sugar

## 2022-07-08 ENCOUNTER — Other Ambulatory Visit (HOSPITAL_COMMUNITY): Payer: Self-pay

## 2022-07-08 MED ORDER — METFORMIN HCL ER 500 MG PO TB24
500.0000 mg | ORAL_TABLET | Freq: Every day | ORAL | 0 refills | Status: DC
  Filled 2022-07-08: qty 30, 30d supply, fill #0
  Filled 2022-08-09: qty 30, 30d supply, fill #1
  Filled 2022-08-26 – 2022-09-01 (×2): qty 30, 30d supply, fill #2

## 2022-07-09 ENCOUNTER — Other Ambulatory Visit (HOSPITAL_COMMUNITY): Payer: Self-pay

## 2022-07-10 ENCOUNTER — Ambulatory Visit
Admission: RE | Admit: 2022-07-10 | Discharge: 2022-07-10 | Disposition: A | Discharge status: Home / Self Care | Payer: 59 | Source: Ambulatory Visit | Attending: Physician Assistant | Admitting: Physician Assistant

## 2022-07-10 VITALS — BP 114/77 | HR 74 | Temp 99.0°F | Resp 18

## 2022-07-10 DIAGNOSIS — M545 Low back pain, unspecified: Secondary | ICD-10-CM | POA: Diagnosis not present

## 2022-07-10 MED ORDER — CYCLOBENZAPRINE HCL 10 MG PO TABS: 10.0000 mg | ORAL_TABLET | Freq: Two times a day (BID) | ORAL | 0 refills | Status: DC | PRN

## 2022-07-10 MED ORDER — DEXAMETHASONE SODIUM PHOSPHATE 10 MG/ML IJ SOLN
20.0000 mg | Freq: Once | INTRAMUSCULAR | Status: AC
  Administered 2022-07-10: 20 mg via INTRAMUSCULAR

## 2022-07-10 MED ORDER — METHYLPREDNISOLONE SODIUM SUCC 125 MG IJ SOLR: 125.0000 mg | Freq: Once | INTRAMUSCULAR | Status: DC

## 2022-07-10 MED ORDER — DEXAMETHASONE SODIUM PHOSPHATE 10 MG/ML IJ SOLN: 20.0000 mg | Freq: Once | INTRAMUSCULAR | Status: DC

## 2022-07-10 NOTE — ED Provider Notes (Signed)
[facility]    CSN: [id number] Arrival date & time: 07/10/22  1036      History   Chief Complaint Chief Complaint  Patient presents with   Back Pain    Entered by patient    HPI Christy Alexander is a 41 y.o. female.   Patient here today with husband for evaluation of pain in her right lower back that radiates into her right glute.  She reports that she had similar symptoms last year was diagnosed with a left lumbar sacral strain by ortho.  She notes that pain at this time is not as severe as it was a year ago however does seem to be progressively worsening.  She did attempt to walk yesterday in hopes that the muscles would loosen and symptoms would improve however this was not effective.  She has also tried using ice, movement, and massage without resolution.  She denies any numbness or tingling.  She has not had any loss of bowel or bladder function.  The history is provided by the patient.  Back Pain Associated symptoms: no fever     Past Medical History:  Diagnosis Date   Anxiety    Back pain at L4-L5 level    BMI 30.0-30.9,adult    Chest pain    Constipation    Delayed pressure urticaria dx age 49   trauma to skin causes deep tissue  swelling   GERD (gastroesophageal reflux disease)    Hyperglycemia    Hyperlipidemia    Laryngopharyngeal reflux    Lower back pain    l4 to l5   Obesity    Right thyroid nodule    Seasonal allergies    TMJ (dislocation of temporomandibular joint)    Urticaria    Vitamin D deficiency     Patient Active Problem List   Diagnosis Date Noted   Microhematuria 07/21/2020   Urge incontinence 07/21/2020   Referred otalgia of both ears 10/31/2019   Throat discomfort 10/31/2019   Double vision 06/04/2019   Paresthesia 06/04/2019   Weakness 06/04/2019   Hyperglycemia 03/22/2019   Dysphonia 07/03/2018   H/O partial thyroidectomy 07/03/2018   Vitamin D deficiency 06/26/2018   Right thyroid nodule 01/27/2018   Multiple joint  pain 05/29/2017   Hyperlipidemia 11/21/2016   Elevated C-reactive protein 06/30/2016   Laryngopharyngeal reflux (LPR) 05/10/2016   Obesity with body mass index 30 or greater 05/10/2016   AP (abdominal pain) 03/18/2016   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   Chronic constipation 03/08/2016   Fissure in skin 03/08/2016   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   Drug allergy 01/26/2016   Idiopathic urticaria 01/26/2016   Allergic rhinitis due to pollen 01/26/2016   Arthritis 01/26/2016   Anxiety 11/10/2015   Lumbosacral disc disease 02/27/2015    Past Surgical History:  Procedure Laterality Date   LUMBAR LAMINECTOMY  2006   L4 and L5 discectomy micro, hemilaminectomy, had conscious sedation for    THYROID LOBECTOMY Right 02/01/2018   Procedure: RIGHT THYROID LOBECTOMY;  Surgeon: Armandina Gemma, MD;  Location: [facility];  Service: General;  Laterality: Right;   WISDOM TOOTH EXTRACTION  2001    OB History   No obstetric history on file.      Home Medications    Prior to Admission medications   Medication Sig Start Date End Date Taking? Authorizing Provider  cyclobenzaprine (FLEXERIL) 10 MG tablet Take 1 tablet (10 mg total) by mouth 2 (two) times daily as needed for muscle spasms. 07/10/22  Yes Ewell Poe  F, PA-C  acetaminophen (TYLENOL) 500 MG tablet Take 1,000 mg by mouth every 6 (six) hours as needed for headache.     [provider]  Ascorbic Acid (VITAMIN C) 1000 MG tablet Take 1,000 mg by mouth at bedtime.    [provider]  cetirizine (ZYRTEC) 10 MG tablet Take 10 mg by mouth at bedtime.     [provider]  diphenhydrAMINE (BENADRYL) 2 % cream Apply 1 application topically 3 (three) times daily as needed (for hives.).    [provider]  diphenhydrAMINE (BENADRYL) 25 mg capsule Take 25-50 mg by mouth every 6 (six) hours as needed (for hives.).     [provider]  Lidocaine 4 % KIT Apply topically daily at 6 (six) AM.    [provider]  meloxicam (MOBIC) 7.5 MG tablet Take 1 tablet by mouth as needed as directed 03/28/21     metFORMIN (GLUCOPHAGE-XR) 500 MG 24 hr tablet Take 500 mg by mouth daily. 06/12/20   [provider]  metFORMIN (GLUCOPHAGE-XR) 500 MG 24 hr tablet Take 1 tablet (500 mg total) by mouth daily. 07/08/22     omeprazole (PRILOSEC) 40 MG capsule TAKE 1 CAPSULE BY MOUTH TWICE DAILY BEFORE A MEAL 04/11/22     VITAMIN D PO Take 1,000 Units by mouth daily.    [provider]    Family History Family History  Problem Relation Age of Onset   Diabetes type II Mother    Heart disease Mother        CHF/CAD   Sleep apnea Mother    Tongue cancer Father    Glaucoma Father    Heart disease Father        CAD   Diabetes type II Brother    Cataracts Brother    Heart attack Maternal Grandmother    Heart disease Maternal Grandmother    Other Maternal Grandfather        worked in [place] Paternal Grandmother    Diabetes Paternal Grandfather    Heart disease Paternal Grandfather    Allergic rhinitis Neg Hx    Angioedema Neg Hx    Asthma Neg Hx    Eczema Neg Hx    Immunodeficiency Neg Hx    Urticaria Neg Hx     Social History Social History   Tobacco Use   Smoking status: Never   Smokeless tobacco: Never  Vaping Use   Vaping Use: Never used  Substance Use Topics   Alcohol use: No   Drug use: No     Allergies   Aspirin, Chlorhexidine, Contrast media [iodinated contrast media], Esomeprazole, and Nsaids   Review of Systems Review of Systems  Constitutional:  Negative for chills and fever.  Eyes:  Negative for discharge and redness.  Respiratory:  Negative for shortness of breath.   Gastrointestinal:  Negative for nausea and vomiting.  Musculoskeletal:  Positive for back pain and myalgias.     Physical Exam Triage Vital Signs ED Triage Vitals  Enc Vitals Group     BP 07/10/22 1055 114/77     Pulse Rate 07/10/22 1055 74     Resp 07/10/22  1055 18     Temp 07/10/22 1055 99 F (37.2 C)     Temp Source 07/10/22 1055 Oral     SpO2 07/10/22 1055 98 %     Weight --      Height --      Head Circumference --  Peak Flow --      Pain Score 07/10/22 1053 4     Pain Loc --      Pain Edu? --      Excl. in [place]? --    No data found.  Updated Vital Signs BP 114/77 (BP Location: Right Arm)   Pulse 74   Temp 99 F (37.2 C) (Oral)   Resp 18   LMP 07/04/2022   SpO2 98%      Physical Exam Vitals and nursing note reviewed.  Constitutional:      General: She is not in acute distress.    Appearance: Normal appearance. She is not ill-appearing.  HENT:     Head: Normocephalic and atraumatic.  Eyes:     Conjunctiva/sclera: Conjunctivae normal.  Cardiovascular:     Rate and Rhythm: Normal rate.  Pulmonary:     Effort: Pulmonary effort is normal. No respiratory distress.  Musculoskeletal:     Comments: No tenderness palpation noted to thoracic or lumbar spine, mild tenderness palpation noted to right lower back  Neurological:     Mental Status: She is alert.  Psychiatric:        Mood and Affect: Mood normal.        Behavior: Behavior normal.        Thought Content: Thought content normal.      UC Treatments / Results  Labs (all labs ordered are listed, but only abnormal results are displayed) Labs Reviewed - No data to display  EKG   Radiology No results found.  Procedures Procedures (including critical care time)  Medications Ordered in UC Medications  dexamethasone (DECADRON) injection 20 mg (20 mg Intramuscular Given 07/10/22 1139)    Initial Impression / Assessment and Plan / UC Course  I have reviewed the triage vital signs and the nursing notes.  Pertinent labs & imaging results that were available during my care of the patient were reviewed by me and considered in my medical decision making (see chart for details).    Steroid injection administered in office and muscle relaxer prescribed.   Discussed that muscle relaxer may cause drowsiness.  Encouraged follow-up with any persistent symptoms or further concerns.  Final Clinical Impressions(s) / UC Diagnoses   Final diagnoses:  Acute right-sided low back pain without sciatica   Discharge Instructions   None    ED Prescriptions     Medication Sig Dispense Auth. Provider   cyclobenzaprine (FLEXERIL) 10 MG tablet Take 1 tablet (10 mg total) by mouth 2 (two) times daily as needed for muscle spasms. 20 tablet Francene Finders, PA-C      PDMP not reviewed this encounter.   Francene Finders, PA-C 07/10/22 1219

## 2022-07-10 NOTE — ED Triage Notes (Signed)
Pt state she felt a pain to right lower back to right  glute.  Started: last Saturday  Home interventions: mobic and I've pack

## 2022-07-14 ENCOUNTER — Emergency Department (HOSPITAL_COMMUNITY): Admission: EM | Admit: 2022-07-14 | Payer: 59 | Source: Home / Self Care

## 2022-07-18 ENCOUNTER — Other Ambulatory Visit: Payer: Self-pay

## 2022-07-18 ENCOUNTER — Emergency Department (HOSPITAL_COMMUNITY): Payer: 59

## 2022-07-18 ENCOUNTER — Emergency Department (HOSPITAL_COMMUNITY)
Admission: EM | Admit: 2022-07-18 | Discharge: 2022-07-19 | Disposition: A | Discharge status: Home / Self Care | Payer: 59 | Attending: Emergency Medicine | Admitting: Emergency Medicine

## 2022-07-18 DIAGNOSIS — M48062 Spinal stenosis, lumbar region with neurogenic claudication: Secondary | ICD-10-CM | POA: Insufficient documentation

## 2022-07-18 DIAGNOSIS — M5416 Radiculopathy, lumbar region: Secondary | ICD-10-CM | POA: Insufficient documentation

## 2022-07-18 DIAGNOSIS — M545 Low back pain, unspecified: Secondary | ICD-10-CM | POA: Diagnosis present

## 2022-07-18 DIAGNOSIS — M5137 Other intervertebral disc degeneration, lumbosacral region: Secondary | ICD-10-CM | POA: Diagnosis not present

## 2022-07-18 DIAGNOSIS — M48061 Spinal stenosis, lumbar region without neurogenic claudication: Secondary | ICD-10-CM

## 2022-07-18 MED ORDER — HYDROCODONE-ACETAMINOPHEN 5-325 MG PO TABS
1.0000 | ORAL_TABLET | Freq: Once | ORAL | Status: DC
  Filled 2022-07-18: qty 1

## 2022-07-18 NOTE — ED Provider Triage Note (Signed)
Emergency Medicine Provider Triage Evaluation Note  Christy Alexander , a 41 y.o. female  was evaluated in triage.  Pt complains of R back pain x2 weeks. Was taking mobic and using ice w/o relief. Went to UC 1 week ago, gave solumedrol IM, since then has had numbness and pain of R leg. No swelling. Pain worse with sitting, laying, walking. Difficulty sleeping. Bruise at the sight.   Review of Systems  Positive: Numbness of R leg,  Negative: No loss of bowel or bladder. No fever.   Physical Exam  BP (!) 135/100   Pulse (!) 117   Temp 98.2 F (36.8 C)   Resp 18   LMP 07/04/2022   SpO2 99%  Gen:   Awake, no distress   Resp:  Normal effort  MSK:   Moves extremities. +SLR Right leg.  Other:  No foot drop noted.   Medical Decision Making  Medically screening exam initiated at 5:59 PM.  Appropriate orders placed.  Waver Carrington was informed that the remainder of the evaluation will be completed by another provider, this initial triage assessment does not replace that evaluation, and the importance of remaining in the ED until their evaluation is complete.    Osvaldo Shipper, [place] 07/18/22 1810

## 2022-07-18 NOTE — ED Triage Notes (Signed)
Pt reports right lower back pain with radiation to right glute x 2 weeks. Pt reports going to UC and receiving steroid injection and muscle relaxer with no relief. Patient reports since UC has numbness to right leg, foot, and toes.  Hx herniated disk.

## 2022-07-19 MED ORDER — OXYCODONE-ACETAMINOPHEN 10-325 MG PO TABS: 1.0000 | ORAL_TABLET | Freq: Four times a day (QID) | ORAL | 0 refills | Status: DC | PRN

## 2022-07-19 NOTE — ED Provider Notes (Signed)
[facility] DEPT Provider Note: Georgena Spurling, MD, FACEP  CSN: [id number] MRN: [id number] ARRIVAL: 07/18/22 at [place]: [facility]   CHIEF COMPLAINT  Back Pain   HISTORY OF PRESENT ILLNESS  07/19/22 1:52 AM Christy Alexander is a 41 y.o. female with a known herniated disc.  She is here with 2 weeks of right lower back pain.  She had a steroid injection at an urgent care 9 days ago.  Since then the pain has been radiating to her right leg and she is having numbness down her right leg in about the L5 dermatome.  Pain is worse at night.  She has no new weakness but has limited range of motion in the right hip due to pain.  She rates her pain as moderate to severe.  She is not currently on any analgesics.  She has a remote history of lumbar laminectomy in 2006.  She denies any bowel or bladder changes.   Past Medical History:  Diagnosis Date   Anxiety    Back pain at L4-L5 level    BMI 30.0-30.9,adult    Chest pain    Constipation    Delayed pressure urticaria dx age 3   trauma to skin causes deep tissue  swelling   GERD (gastroesophageal reflux disease)    Hyperglycemia    Hyperlipidemia    Laryngopharyngeal reflux    Lower back pain    l4 to l5   Obesity    Right thyroid nodule    Seasonal allergies    TMJ (dislocation of temporomandibular joint)    Urticaria    Vitamin D deficiency     Past Surgical History:  Procedure Laterality Date   LUMBAR LAMINECTOMY  2006   L4 and L5 discectomy micro, hemilaminectomy, had conscious sedation for    THYROID LOBECTOMY Right 02/01/2018   Procedure: RIGHT THYROID LOBECTOMY;  Surgeon: Armandina Gemma, MD;  Location: [facility];  Service: General;  Laterality: Right;   WISDOM TOOTH EXTRACTION  2001    Family History  Problem Relation Age of Onset   Diabetes type II Mother    Heart disease Mother        CHF/CAD   Sleep apnea Mother    Tongue cancer Father    Glaucoma Father    Heart disease Father        CAD   Diabetes type II  Brother    Cataracts Brother    Heart attack Maternal Grandmother    Heart disease Maternal Grandmother    Other Maternal Grandfather        worked in [employer]ile [employer] mine   Colon cancer Paternal Grandmother    Diabetes Paternal Grandfather    Heart disease Paternal Grandfather    Allergic rhinitis Neg Hx    Angioedema Neg Hx    Asthma Neg Hx    Eczema Neg Hx    Immunodeficiency Neg Hx    Urticaria Neg Hx     Social History   Tobacco Use   Smoking status: Never   Smokeless tobacco: Never  Vaping Use   Vaping Use: Never used  Substance Use Topics   Alcohol use: No   Drug use: No    Prior to Admission medications   Medication Sig Start Date End Date Taking? Authorizing Provider  oxyCODONE-acetaminophen (PERCOCET) 10-325 MG tablet Take 1 tablet by mouth every 6 (six) hours as needed for pain. 07/19/22  Yes Tigerlily Christine, MD  Ascorbic Acid (VITAMIN C) 1000 MG tablet Take 1,000 mg by mouth  at bedtime.    [provider]  cetirizine (ZYRTEC) 10 MG tablet Take 10 mg by mouth at bedtime.     [provider]  cyclobenzaprine (FLEXERIL) 10 MG tablet Take 1 tablet (10 mg total) by mouth 2 (two) times daily as needed for muscle spasms. 07/10/22   Francene Finders, PA-C  diphenhydrAMINE (BENADRYL) 2 % cream Apply 1 application topically 3 (three) times daily as needed (for hives.).    [provider]  diphenhydrAMINE (BENADRYL) 25 mg capsule Take 25-50 mg by mouth every 6 (six) hours as needed (for hives.).     [provider]  Lidocaine 4 % KIT Apply topically daily at 6 (six) AM.    [provider]  meloxicam (MOBIC) 7.5 MG tablet Take 1 tablet by mouth as needed as directed 03/28/21     metFORMIN (GLUCOPHAGE-XR) 500 MG 24 hr tablet Take 500 mg by mouth daily. 06/12/20   [provider]  metFORMIN (GLUCOPHAGE-XR) 500 MG 24 hr tablet Take 1 tablet (500 mg total) by mouth daily. 07/08/22     omeprazole (PRILOSEC) 40 MG capsule TAKE 1 CAPSULE BY  MOUTH TWICE DAILY BEFORE A MEAL 04/11/22     VITAMIN D PO Take 1,000 Units by mouth daily.    [provider]    Allergies Aspirin, Chlorhexidine, Esomeprazole, Nsaids, Chlorhexidine gluconate, Esomeprazole magnesium, and Iodinated contrast media   REVIEW OF SYSTEMS  Negative except as noted here or in the History of Present Illness.   PHYSICAL EXAMINATION  Initial Vital Signs Blood pressure 117/68, pulse 84, temperature 97.6 F (36.4 C), temperature source Oral, resp. rate 18, last menstrual period 07/04/2022, SpO2 99 %.  Examination General: Well-developed, well-nourished female in no acute distress; appearance consistent with age of record HENT: normocephalic; atraumatic Eyes: Normal appearance Neck: supple Heart: regular rate and rhythm Lungs: clear to auscultation bilaterally Abdomen: soft; nondistended; nontender; bowel sounds present Extremities: No deformity; full range of motion; pulses normal Neurologic: Awake, alert and oriented; motor function intact in all extremities and symmetric; altered sensation of the right lower extremity in about the L5 dermatome; no facial droop Skin: Warm and dry Psychiatric: Normal mood and affect   RESULTS  Summary of this visit's results, reviewed and interpreted by myself:   EKG Interpretation  Date/Time:    Ventricular Rate:    PR Interval:    QRS Duration:   QT Interval:    QTC Calculation:   R Axis:     Text Interpretation:         Laboratory Studies: No results found for this or any previous visit (from the past 24 hour(s)). Imaging Studies: MR LUMBAR SPINE WO CONTRAST  Result Date: 07/18/2022 CLINICAL DATA:  Initial evaluation for right leg numbness. EXAM: MRI LUMBAR SPINE WITHOUT CONTRAST TECHNIQUE: Multiplanar, multisequence MR imaging of the lumbar spine was performed. No intravenous contrast was administered. COMPARISON:  Prior study from 01/22/2020. FINDINGS: Segmentation: Standard. Lowest well-formed  disc space labeled the L5-S1 level. Alignment: Physiologic with preservation of the normal lumbar lordosis. No listhesis. Vertebrae: Vertebral body height maintained without acute or chronic fracture. Endplate Schmorl's node deformity noted at the inferior endplate of L4. Bone marrow signal intensity within normal limits. Mild discogenic reactive endplate changes present about the L4-5 interspace. No other abnormal marrow edema. Conus medullaris and cauda equina: Conus extends to the L1 level. Conus and cauda equina appear normal. Paraspinal and other soft tissues: Unremarkable. Disc levels: T11-12: Seen only on sagittal projection. Disc desiccation  with mild disc bulge. Probable superimposed tiny central disc extrusion with inferior migration. No significant stenosis. T12-L1: Unremarkable. L1-2: Negative interspace.  Mild facet spurring.  No stenosis. L2-3: Disc desiccation with mild disc bulge. Superimposed small left subarticular disc protrusion with slight inferior migration. Associated annular fissure. Mild facet hypertrophy. No significant spinal stenosis. Foramina remain patent. L3-4: Disc desiccation with mild disc bulge. Central annular fissure. Mild facet hypertrophy. No significant spinal stenosis. Foramina remain patent. L4-5: Disc desiccation with mild disc bulge. Sequelae of prior right hemi laminectomy. Recurrent right subarticular disc extrusion with inferior migration. Disc material contacts and displaces both the descending right L5 and S1 nerve roots. Resultant severe canal with right lateral recess stenosis. Foramina remain patent. L5-S1: Negative interspace. Mild epidural lipomatosis. No canal or foraminal stenosis. IMPRESSION: 1. Recurrent right subarticular disc extrusion with inferior migration at L4-5, resulting in severe canal and right lateral recess stenosis. Disc material contacts and displaces both the descending right L5 and S1 nerve roots. 2. Additional mild noncompressive disc  bulging at L2-3 and L3-4 without significant stenosis or impingement. Electronically Signed   By: Jeannine Boga M.D.   On: 07/18/2022 22:16    ED COURSE and MDM  Nursing notes, initial and subsequent vitals signs, including pulse oximetry, reviewed and interpreted by myself.  Vitals:   07/18/22 1755 07/19/22 0128  BP: (!) 135/100 117/68  Pulse: (!) 117 84  Resp: 18 18  Temp: 98.2 F (36.8 C) 97.6 F (36.4 C)  TempSrc:  Oral  SpO2: 99% 99%   Medications  HYDROcodone-acetaminophen (NORCO/VICODIN) 5-325 MG per tablet 1 tablet (1 tablet Oral Patient Refused/Not Given 07/19/22 0112)   The patient's MRI findings are consistent with an L5 and S1 radiculopathy.  Her symptoms are primarily associated with the L5 dermatome.  We will refer to neurosurgery and treat with an analgesic in the meantime.  No evidence of bowel or bladder changes at the present time.   PROCEDURES  Procedures   ED DIAGNOSES     ICD-10-CM   1. Lumbar radiculopathy  M54.16     2. Lumbar foraminal stenosis  M48.061     3. 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 Degeneration of lumbar or lumbosacral intervertebral disc  M51.37          Shanon Rosser, MD 07/19/22 [phone]

## 2022-08-09 ENCOUNTER — Other Ambulatory Visit (HOSPITAL_COMMUNITY): Payer: Self-pay

## 2022-08-26 ENCOUNTER — Other Ambulatory Visit (HOSPITAL_COMMUNITY): Payer: Self-pay

## 2022-08-27 ENCOUNTER — Other Ambulatory Visit (HOSPITAL_COMMUNITY): Payer: Self-pay

## 2022-09-17 ENCOUNTER — Other Ambulatory Visit (HOSPITAL_COMMUNITY): Payer: Self-pay

## 2022-10-13 ENCOUNTER — Other Ambulatory Visit (HOSPITAL_COMMUNITY): Payer: Self-pay

## 2022-10-14 ENCOUNTER — Other Ambulatory Visit (HOSPITAL_COMMUNITY): Payer: Self-pay

## 2022-10-14 MED ORDER — OMEPRAZOLE 40 MG PO CPDR
40.0000 mg | DELAYED_RELEASE_CAPSULE | Freq: Two times a day (BID) | ORAL | 5 refills | Status: DC
  Filled 2022-10-14: qty 60, 30d supply, fill #0
  Filled 2022-11-08: qty 60, 30d supply, fill #1
  Filled 2022-12-08: qty 60, 30d supply, fill #2
  Filled 2023-01-11: qty 60, 30d supply, fill #3
  Filled 2023-02-11: qty 60, 30d supply, fill #4
  Filled 2023-03-12: qty 60, 30d supply, fill #5

## 2022-10-14 MED ORDER — METFORMIN HCL ER 500 MG PO TB24
500.0000 mg | ORAL_TABLET | Freq: Every day | ORAL | 3 refills | Status: DC
  Filled 2022-10-14: qty 30, 30d supply, fill #0
  Filled 2022-11-08: qty 30, 30d supply, fill #1
  Filled 2022-12-08: qty 30, 30d supply, fill #2
  Filled 2023-01-11: qty 30, 30d supply, fill #3
  Filled 2023-02-11: qty 30, 30d supply, fill #4
  Filled 2023-03-12: qty 30, 30d supply, fill #5
  Filled 2023-04-10: qty 30, 30d supply, fill #6
  Filled 2023-05-15: qty 30, 30d supply, fill #7
  Filled 2023-06-12: qty 30, 30d supply, fill #8
  Filled 2023-07-10: qty 30, 30d supply, fill #9
  Filled 2023-08-09: qty 30, 30d supply, fill #10
  Filled 2023-09-25: qty 30, 30d supply, fill #11

## 2022-10-19 ENCOUNTER — Ambulatory Visit: Payer: 59 | Admitting: Dermatology

## 2022-11-08 ENCOUNTER — Other Ambulatory Visit (HOSPITAL_COMMUNITY): Payer: Self-pay

## 2022-12-08 ENCOUNTER — Other Ambulatory Visit: Payer: Self-pay

## 2022-12-09 ENCOUNTER — Other Ambulatory Visit (HOSPITAL_COMMUNITY): Payer: Self-pay

## 2022-12-09 MED ORDER — LO LOESTRIN FE 1 MG-10 MCG / 10 MCG PO TABS
1.0000 | ORAL_TABLET | Freq: Every day | ORAL | 3 refills | Status: DC
  Filled 2022-12-09: qty 84, 84d supply, fill #0
  Filled 2023-02-26: qty 84, 84d supply, fill #1
  Filled 2023-04-10 – 2023-05-06 (×2): qty 84, 84d supply, fill #2
  Filled 2023-07-10: qty 84, 84d supply, fill #3
  Filled ????-??-??: fill #3

## 2022-12-20 ENCOUNTER — Other Ambulatory Visit: Payer: Self-pay | Admitting: Family

## 2022-12-20 DIAGNOSIS — G8929 Other chronic pain: Secondary | ICD-10-CM

## 2023-01-12 ENCOUNTER — Other Ambulatory Visit (HOSPITAL_COMMUNITY): Payer: Self-pay

## 2023-01-12 ENCOUNTER — Ambulatory Visit
Admission: RE | Admit: 2023-01-12 | Discharge: 2023-01-12 | Disposition: A | Discharge status: Home / Self Care | Payer: 59 | Source: Ambulatory Visit | Attending: Family | Admitting: Family

## 2023-01-12 DIAGNOSIS — G8929 Other chronic pain: Secondary | ICD-10-CM

## 2023-01-12 MED ORDER — GADOPICLENOL 0.5 MMOL/ML IV SOLN
10.0000 mL | Freq: Once | INTRAVENOUS | Status: AC | PRN
  Administered 2023-01-12: 10 mL via INTRAVENOUS

## 2023-02-14 ENCOUNTER — Other Ambulatory Visit: Payer: Self-pay

## 2023-02-27 ENCOUNTER — Other Ambulatory Visit: Payer: Self-pay

## 2023-03-13 ENCOUNTER — Other Ambulatory Visit: Payer: Self-pay

## 2023-04-10 ENCOUNTER — Other Ambulatory Visit: Payer: Self-pay

## 2023-04-10 ENCOUNTER — Other Ambulatory Visit (HOSPITAL_COMMUNITY): Payer: Self-pay

## 2023-04-10 MED ORDER — MELOXICAM 7.5 MG PO TABS
7.5000 mg | ORAL_TABLET | Freq: Every day | ORAL | 3 refills | Status: DC
  Filled 2023-04-10 (×2): qty 30, 30d supply, fill #0
  Filled 2023-08-09: qty 30, 30d supply, fill #1

## 2023-04-14 LAB — LAB REPORT - SCANNED: EGFR: 113

## 2023-04-15 ENCOUNTER — Other Ambulatory Visit (HOSPITAL_COMMUNITY): Payer: Self-pay

## 2023-04-17 ENCOUNTER — Other Ambulatory Visit: Payer: Self-pay | Admitting: Internal Medicine

## 2023-04-17 DIAGNOSIS — R7401 Elevation of levels of liver transaminase levels: Secondary | ICD-10-CM

## 2023-04-19 ENCOUNTER — Other Ambulatory Visit (HOSPITAL_COMMUNITY): Payer: Self-pay

## 2023-04-19 MED ORDER — OMEPRAZOLE 40 MG PO CPDR
40.0000 mg | DELAYED_RELEASE_CAPSULE | Freq: Two times a day (BID) | ORAL | 3 refills | Status: DC
  Filled 2023-04-19: qty 60, 30d supply, fill #0
  Filled 2023-05-15: qty 60, 30d supply, fill #1
  Filled 2023-06-13: qty 60, 30d supply, fill #2
  Filled 2023-07-13: qty 60, 30d supply, fill #3
  Filled 2023-08-09: qty 60, 30d supply, fill #4
  Filled 2023-09-25: qty 60, 30d supply, fill #5

## 2023-04-21 ENCOUNTER — Ambulatory Visit
Admission: RE | Admit: 2023-04-21 | Discharge: 2023-04-21 | Disposition: A | Discharge status: Home / Self Care | Payer: 59 | Source: Ambulatory Visit | Attending: Internal Medicine | Admitting: Internal Medicine

## 2023-04-21 DIAGNOSIS — K760 Fatty (change of) liver, not elsewhere classified: Secondary | ICD-10-CM | POA: Insufficient documentation

## 2023-04-21 DIAGNOSIS — R7401 Elevation of levels of liver transaminase levels: Secondary | ICD-10-CM

## 2023-04-24 NOTE — Progress Notes (Unsigned)
Office Visit Note  Patient: Christy Alexander             Date of Birth: 13-Sep-1981           MRN: [id number]             PCP: Eldridge Abrahams, MD Referring: Eldridge Abrahams, MD Visit Date: 04/25/2023 Occupation: @GUAROCC @  Subjective:  Pain in joints  History of Present Illness: Kentra Zaid is a 42 y.o. female with history of osteoarthritis and sicca symptoms.  She returns today after her last visit on November 11, 2020.  According the patient she has been experiencing increased fatigue and joint pain since June 2024.  She was seen by her PCP who did lab work and found her sedimentation rate was a still elevated.  She was referred for the evaluation of elevated sedimentation rate.  She continues to have dry mouth and dry eye symptoms.  She states she does not use any over-the-counter products currently.  She was experiencing 5.  In her hands, knee joints and increased fatigue.  She states she had some form of insect bite and had a screening for Lyme disease which was negative.  She never developed a target lesion.  She denies any history of joint swelling.  She states the pain in her joints gradually improved.  She continues to have some stiffness in her hands and her feet due to underlying osteoarthritis.  She has been also followed by allergist for urticaria.  She has chronic lower back pain due to underlying disc disease.  She denies any history of  oral ulcers, nasal ulcers, malar rash, photosensitivity, Raynaud's or lymphadenopathy.   Activities of Daily Living:  Patient reports morning stiffness for 15 minutes.   Patient Reports nocturnal pain.  Difficulty dressing/grooming: Denies Difficulty climbing stairs: Denies Difficulty getting out of chair: Denies Difficulty using hands for taps, buttons, cutlery, and/or writing: Denies  Review of Systems  Constitutional:  Positive for fatigue.  HENT:  Positive for mouth dryness. Negative for mouth sores.   Eyes:  Positive for  dryness.  Respiratory:  Negative for shortness of breath.   Cardiovascular:  Negative for chest pain and palpitations.  Gastrointestinal:  Negative for blood in stool, constipation and diarrhea.  Endocrine: Negative for increased urination.  Genitourinary:  Negative for involuntary urination.  Musculoskeletal:  Positive for joint pain, joint pain, joint swelling, myalgias, morning stiffness and myalgias. Negative for gait problem, muscle weakness and muscle tenderness.  Skin:  Negative for color change, rash, hair loss and sensitivity to sunlight.  Allergic/Immunologic: Negative for susceptible to infections.  Neurological:  Negative for dizziness, numbness and headaches.  Hematological:  Negative for swollen glands.  Psychiatric/Behavioral:  Negative for depressed mood and sleep disturbance. The patient is nervous/anxious.     PMFS History:  Patient Active Problem List   Diagnosis Date Noted   Microhematuria 07/21/2020   Urge incontinence 07/21/2020   Referred otalgia of both ears 10/31/2019   Throat discomfort 10/31/2019   Double vision 06/04/2019   Paresthesia 06/04/2019   Weakness 06/04/2019   Hyperglycemia 03/22/2019   Dysphonia 07/03/2018   H/O partial thyroidectomy 07/03/2018   Vitamin D deficiency 06/26/2018   Right thyroid nodule 01/27/2018   Multiple joint pain 05/29/2017   Hyperlipidemia 11/21/2016   Elevated C-reactive protein 06/30/2016   Laryngopharyngeal reflux (LPR) 05/10/2016   Obesity with body mass index 30 or greater 05/10/2016   AP (abdominal pain) 03/18/2016   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   Chronic constipation 03/08/2016  Fissure in skin 03/08/2016   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   Drug allergy 01/26/2016   Idiopathic urticaria 01/26/2016   Allergic rhinitis due to pollen 01/26/2016   Arthritis 01/26/2016   Anxiety 11/10/2015   Lumbosacral disc disease 02/27/2015    Past Medical History:  Diagnosis Date   Anxiety    Back pain at L4-L5  level    BMI 30.0-30.9,adult    Chest pain    Constipation    Delayed pressure urticaria dx age 54   trauma to skin causes deep tissue  swelling   GERD (gastroesophageal reflux disease)    Hyperglycemia    Hyperlipidemia    Laryngopharyngeal reflux    Lower back pain    l4 to l5   Obesity    Right thyroid nodule    Seasonal allergies    TMJ (dislocation of temporomandibular joint)    Urticaria    Vitamin D deficiency     Family History  Problem Relation Age of Onset   Diabetes type II Mother    Heart disease Mother        CHF/CAD   Sleep apnea Mother    Kidney failure Mother    Tongue cancer Father    Glaucoma Father    Heart disease Father        CAD   Diabetes type II Brother    Cataracts Brother    Hypertension Brother    Anxiety disorder Brother    Depression Brother    Heart attack Maternal Grandmother    Heart disease Maternal Grandmother    Other Maternal Grandfather        worked in Haematologist mine   Colon cancer Paternal Grandmother    Diabetes Paternal Grandfather    Heart disease Paternal Grandfather    Allergic rhinitis Neg Hx    Angioedema Neg Hx    Asthma Neg Hx    Eczema Neg Hx    Immunodeficiency Neg Hx    Urticaria Neg Hx    Past Surgical History:  Procedure Laterality Date   LUMBAR LAMINECTOMY  2006   L4 and L5 discectomy micro, hemilaminectomy, had conscious sedation for    THYROID LOBECTOMY Right 02/01/2018   Procedure: RIGHT THYROID LOBECTOMY;  Surgeon: Darnell Level, MD;  Location: [facility];  Service: General;  Laterality: Right;   WISDOM TOOTH EXTRACTION  2001   Social History   Social History Narrative   Lives at home with her husband.   Right-handed.   1 cup coffee per day.   Immunization History  Administered Date(s) Administered   Influenza,inj,quad, With Preservative 07/10/2018, 07/01/2019   Influenza-Unspecified 06/24/2015, 06/23/2016, 06/26/2017, 07/09/2018, 07/01/2019, 06/30/2020   PFIZER(Purple Top)SARS-COV-2 Vaccination  11/06/2019, 11/28/2019, 09/08/2020   Td 02/04/2015   Tdap 02/04/2015   Tetanus 02/04/2015     Objective: Vital Signs: BP 136/88 (BP Location: Left Arm, Patient Position: Sitting, Cuff Size: Normal)   Pulse 98   Resp 16   Ht 5\' 4"  (1.626 m)   Wt 240 lb (108.9 kg)   BMI 41.20 kg/m    Physical Exam Vitals and nursing note reviewed.  Constitutional:      Appearance: She is well-developed.  HENT:     Head: Normocephalic and atraumatic.  Eyes:     Conjunctiva/sclera: Conjunctivae normal.  Cardiovascular:     Rate and Rhythm: Normal rate and regular rhythm.     Heart sounds: Normal heart sounds.  Pulmonary:     Effort: Pulmonary effort is normal.  Breath sounds: Normal breath sounds.  Abdominal:     General: Bowel sounds are normal.     Palpations: Abdomen is soft.  Musculoskeletal:     Cervical back: Normal range of motion.  Lymphadenopathy:     Cervical: No cervical adenopathy.  Skin:    General: Skin is warm and dry.     Capillary Refill: Capillary refill takes less than 2 seconds.  Neurological:     Mental Status: She is alert and oriented to person, place, and time.  Psychiatric:        Behavior: Behavior normal.      Musculoskeletal Exam: Cervical, thoracic and lumbar spine were in good range of motion.  Shoulder joints, elbow joints, wrist joints, MCPs PIPs and DIPs were in good range of motion with no synovitis.  Hip joints, knee joints, ankles, MTPs and PIPs were in good range of motion with no synovitis.  CDAI Exam: CDAI Score: -- Patient Global: --; Provider Global: -- Swollen: --; Tender: -- Joint Exam 04/25/2023   No joint exam has been documented for this visit   There is currently no information documented on the homunculus. Go to the Rheumatology activity and complete the homunculus joint exam.  Investigation: No additional findings.  Imaging: US Abdomen Complete  Result Date: 04/21/2023 CLINICAL DATA:  Elevated transaminase levels. EXAM:  ABDOMEN ULTRASOUND COMPLETE COMPARISON:  CT abdomen pelvis 01/28/2021 FINDINGS: Gallbladder: No gallstones or wall thickening visualized. No sonographic Murphy sign noted by sonographer. Common bile duct: Diameter: 3.9 mm Liver: Increased echogenicity. Fatty sparing adjacent to the gallbladder fossa. Portal vein is patent on color Doppler imaging with normal direction of blood flow towards the liver. IVC: No abnormality visualized. Pancreas: Visualized portion unremarkable. Spleen: Size and appearance within normal limits. Right Kidney: Length: 12.3 cm. Echogenicity within normal limits. No mass or hydronephrosis visualized. Left Kidney: Length: 10.6 cm. Echogenicity within normal limits. No mass or hydronephrosis visualized. Abdominal aorta: No aneurysm visualized. Other findings: None. IMPRESSION: 1. No cholelithiasis or sonographic evidence for acute cholecystitis. 2. Increased echogenicity throughout the liver suggestive of steatosis. Electronically Signed   By: Annia Belt M.D.   On: 04/21/2023 11:08    Recent Labs: Lab Results  Component Value Date   WBC 5.5 10/15/2020   HGB 14.0 10/15/2020   PLT 307 10/15/2020   NA 140 10/15/2020   K 4.4 10/15/2020   CL 105 10/15/2020   CO2 26 10/15/2020   GLUCOSE 98 10/15/2020   BUN 20 10/15/2020   CREATININE 0.69 10/15/2020   BILITOT 0.3 10/15/2020   ALKPHOS 68 12/27/2019   AST 24 10/15/2020   ALT 31 (H) 10/15/2020   PROT 6.6 10/15/2020   PROT 6.9 10/15/2020   ALBUMIN 3.6 12/27/2019   CALCIUM 9.0 10/15/2020   GFRAA 128 10/15/2020   April 14, 2023 CBC WBC 8.0, hemoglobin 13.4, platelets 304 differential normal, RMSF negative, ESR 30, TSH normal, T4 normal, RF negative, Lyme negative, LFTs AST 31, ALT 29, C-reactive protein 26.3, CK 40, BMP normal GFR 113, ANA negative, aldolase 5.5 Speciality Comments: No specialty comments available.  Procedures:  No procedures performed Allergies: Aspirin, Chlorhexidine, Esomeprazole, Nsaids, Chlorhexidine  gluconate, Esomeprazole magnesium, and Iodinated contrast media   Assessment / Plan:     Visit Diagnoses: Elevated sed rate -patient has history of elevated sedimentation rate in the past.  She was sent again by her physician for elevated sedimentation rate of 30.  Patient states the symptoms started after an insect bite with increased pain and  discomfort in her joints and elevated sedimentation rate.  The joint pain and discomfort has resolved.  She has some baseline stiffness due to underlying osteoarthritis.  Her sedimentation rate was 30 on April 14, 2023.  CRP was also elevated.  She had no synovitis on the examination today.  Will repeat sedimentation rate and couple of months.    Sicca complex (HCC) -she continues to have dry mouth and dry eye symptoms.  She uses over-the-counter products intermittently.  Autoimmune workup was negative in the past.  June 08, 2020 ANA negative, recent labs showed SSA antibody negative, SSB antibody negative.  She had labs on April 14, 2023 ANA was negative.  RF is negative.  Primary osteoarthritis of both hands -she complains of increased pain and stiffness in her hands after the recent visit in June.  She also noticed some swelling in her hands.  No synovitis was noted on the examination today.  She states the symptoms have improved.  X-rays obtained at the previous visit were suggestive of early osteoarthritic changes.  I advised her to contact me if she develops any increased pain and swelling.  In that case I may consider ultrasound of her bilateral hands.  Primary osteoarthritis of both feet - History of a stiffness in feet.  X-rays in the past showed early osteoarthritic changes.  Idiopathic urticaria -she has recurrent urticaria for many years.  She had a good response to Zyrtec.  C1 esterase inhibitor function is normal.  She is followed by an allergist.  Lumbosacral disc disease -has intermittent lower back pain.  She is status post L4-L5 laminectomy  at age 42.  Elevated LFTs-her AST was mildly elevated at 31.  Patient states she had ultrasound of her abdomen which showed  fatty liver.  Other medical problems are listed as follows:  Vitamin D deficiency  History of hyperlipidemia  History of anxiety  Chronic constipation  H/O partial thyroidectomy  Laryngopharyngeal reflux (LPR)  Microhematuria  Orders: Orders Placed This Encounter  Procedures   Sedimentation rate   No orders of the defined types were placed in this encounter.  .  Follow-Up Instructions: Return in about 2 months (around 06/25/2023).   Pollyann Savoy, MD  Note - This record has been created using Animal nutritionist.  Chart creation errors have been sought, but may not always  have been located. Such creation errors do not reflect on  the standard of medical care.

## 2023-04-25 ENCOUNTER — Ambulatory Visit: Payer: 59 | Attending: Rheumatology | Admitting: Rheumatology

## 2023-04-25 ENCOUNTER — Encounter: Payer: Self-pay | Admitting: Rheumatology

## 2023-04-25 VITALS — BP 136/88 | HR 98 | Resp 16 | Ht 64.0 in | Wt 240.0 lb

## 2023-04-25 DIAGNOSIS — M19071 Primary osteoarthritis, right ankle and foot: Secondary | ICD-10-CM

## 2023-04-25 DIAGNOSIS — R700 Elevated erythrocyte sedimentation rate: Secondary | ICD-10-CM | POA: Diagnosis not present

## 2023-04-25 DIAGNOSIS — Z8659 Personal history of other mental and behavioral disorders: Secondary | ICD-10-CM

## 2023-04-25 DIAGNOSIS — K219 Gastro-esophageal reflux disease without esophagitis: Secondary | ICD-10-CM

## 2023-04-25 DIAGNOSIS — E890 Postprocedural hypothyroidism: Secondary | ICD-10-CM

## 2023-04-25 DIAGNOSIS — M19041 Primary osteoarthritis, right hand: Secondary | ICD-10-CM | POA: Diagnosis not present

## 2023-04-25 DIAGNOSIS — R7989 Other specified abnormal findings of blood chemistry: Secondary | ICD-10-CM

## 2023-04-25 DIAGNOSIS — M19042 Primary osteoarthritis, left hand: Secondary | ICD-10-CM

## 2023-04-25 DIAGNOSIS — M3500 Sicca syndrome, unspecified: Secondary | ICD-10-CM | POA: Diagnosis not present

## 2023-04-25 DIAGNOSIS — M19072 Primary osteoarthritis, left ankle and foot: Secondary | ICD-10-CM

## 2023-04-25 DIAGNOSIS — M519 Unspecified thoracic, thoracolumbar and lumbosacral intervertebral disc disorder: Secondary | ICD-10-CM

## 2023-04-25 DIAGNOSIS — L501 Idiopathic urticaria: Secondary | ICD-10-CM

## 2023-04-25 DIAGNOSIS — E559 Vitamin D deficiency, unspecified: Secondary | ICD-10-CM

## 2023-04-25 DIAGNOSIS — Z8639 Personal history of other endocrine, nutritional and metabolic disease: Secondary | ICD-10-CM

## 2023-04-25 DIAGNOSIS — R3129 Other microscopic hematuria: Secondary | ICD-10-CM

## 2023-04-25 DIAGNOSIS — K5909 Other constipation: Secondary | ICD-10-CM

## 2023-05-06 ENCOUNTER — Other Ambulatory Visit (HOSPITAL_COMMUNITY): Payer: Self-pay

## 2023-05-15 ENCOUNTER — Other Ambulatory Visit (HOSPITAL_COMMUNITY): Payer: Self-pay

## 2023-06-12 ENCOUNTER — Other Ambulatory Visit: Payer: Self-pay

## 2023-06-13 ENCOUNTER — Other Ambulatory Visit (HOSPITAL_BASED_OUTPATIENT_CLINIC_OR_DEPARTMENT_OTHER): Payer: Self-pay

## 2023-06-13 ENCOUNTER — Other Ambulatory Visit (HOSPITAL_COMMUNITY): Payer: Self-pay

## 2023-06-13 NOTE — Progress Notes (Signed)
Office Visit Note  Patient: Christy Alexander             Date of Birth: [birth year]           MRN: [id number]             PCP: Eldridge Abrahams, MD Referring: Eldridge Abrahams, MD Visit Date: 06/27/2023 Occupation: @GUAROCC @  Subjective:  Elevated sed rate   History of Present Illness: Christy Alexander is a 42 y.o. female with history of elevated sed rate and osteoarthritis.   Patient reports that her arthralgias have been less severe since her last office visit.  She continues to experience pain in both hands especially in the first MCP joints with activity.  She experiences sharp shooting pains in bilateral thumbs with gripping.  She denies any swelling in her hands at this time.  She has warmth in her knee joints with exercise.  She takes meloxicam very sparingly for lower back pain. She continues to have chronic sicca symptoms which have been manageable.  She denies any other new symptoms since her last office visit.  She would like to have sed rate and CRP rechecked today.   Activities of Daily Living:  Patient reports morning stiffness for a few minutes.   Patient Denies nocturnal pain.  Difficulty dressing/grooming: Denies Difficulty climbing stairs: Denies Difficulty getting out of chair: Denies Difficulty using hands for taps, buttons, cutlery, and/or writing: Denies  Review of Systems  Constitutional:  Positive for fatigue.  HENT:  Positive for mouth dryness. Negative for mouth sores.   Eyes:  Positive for dryness.  Respiratory:  Negative for shortness of breath.   Cardiovascular:  Negative for chest pain and palpitations.  Gastrointestinal:  Positive for constipation and diarrhea. Negative for blood in stool.  Endocrine: Negative for increased urination.  Genitourinary:  Negative for involuntary urination.  Musculoskeletal:  Positive for joint pain, joint pain and morning stiffness. Negative for gait problem, joint swelling, myalgias, muscle weakness, muscle tenderness and  myalgias.  Skin:  Negative for color change, rash, hair loss and sensitivity to sunlight.  Allergic/Immunologic: Negative for susceptible to infections.  Neurological:  Positive for headaches. Negative for dizziness.  Hematological:  Negative for swollen glands.  Psychiatric/Behavioral:  Positive for depressed mood. Negative for sleep disturbance. The patient is nervous/anxious.     PMFS History:  Patient Active Problem List   Diagnosis Date Noted   Microhematuria 07/21/2020   Urge incontinence 07/21/2020   Referred otalgia of both ears 10/31/2019   Throat discomfort 10/31/2019   Double vision 06/04/2019   Paresthesia 06/04/2019   Weakness 06/04/2019   Hyperglycemia 03/22/2019   Dysphonia 07/03/2018   H/O partial thyroidectomy 07/03/2018   Vitamin D deficiency 06/26/2018   Right thyroid nodule 01/27/2018   Multiple joint pain 05/29/2017   Hyperlipidemia 11/21/2016   Elevated C-reactive protein 06/30/2016   Laryngopharyngeal reflux (LPR) 05/10/2016   Obesity with body mass index 30 or greater 05/10/2016   AP (abdominal pain) 03/18/2016   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   Chronic constipation 03/08/2016   Fissure in skin 03/08/2016   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   Drug allergy 01/26/2016   Idiopathic urticaria 01/26/2016   Allergic rhinitis due to pollen 01/26/2016   Arthritis 01/26/2016   Anxiety 11/10/2015   Lumbosacral disc disease 02/27/2015    Past Medical History:  Diagnosis Date   Anxiety    Back pain at L4-L5 level    BMI 30.0-30.9,adult    Chest pain    Constipation  Delayed pressure urticaria dx age 65   trauma to skin causes deep tissue  swelling   GERD (gastroesophageal reflux disease)    Hyperglycemia    Hyperlipidemia    Laryngopharyngeal reflux    Lower back pain    l4 to l5   Obesity    Right thyroid nodule    Seasonal allergies    TMJ (dislocation of temporomandibular joint)    Urticaria    Vitamin D deficiency     Family History   Problem Relation Age of Onset   Diabetes type II Mother    Heart disease Mother        CHF/CAD   Sleep apnea Mother    Kidney failure Mother    Tongue cancer Father    Glaucoma Father    Heart disease Father        CAD   Diabetes type II Brother    Cataracts Brother    Hypertension Brother    Anxiety disorder Brother    Depression Brother    Heart attack Maternal Grandmother    Heart disease Maternal Grandmother    Other Maternal Grandfather        worked in Haematologist mine   Colon cancer Paternal Grandmother    Diabetes Paternal Grandfather    Heart disease Paternal Grandfather    Allergic rhinitis Neg Hx    Angioedema Neg Hx    Asthma Neg Hx    Eczema Neg Hx    Immunodeficiency Neg Hx    Urticaria Neg Hx    Past Surgical History:  Procedure Laterality Date   LUMBAR LAMINECTOMY  2006   L4 and L5 discectomy micro, hemilaminectomy, had conscious sedation for    THYROID LOBECTOMY Right 02/01/2018   Procedure: RIGHT THYROID LOBECTOMY;  Surgeon: Darnell Level, MD;  Location: [facility];  Service: General;  Laterality: Right;   WISDOM TOOTH EXTRACTION  2001   Social History   Social History Narrative   Lives at home with her husband.   Right-handed.   1 cup coffee per day.   Immunization History  Administered Date(s) Administered   Influenza,inj,quad, With Preservative 07/10/2018, 07/01/2019   Influenza-Unspecified 06/24/2015, 06/23/2016, 06/26/2017, 07/09/2018, 07/01/2019, 06/30/2020   PFIZER(Purple Top)SARS-COV-2 Vaccination 11/06/2019, 11/28/2019, 09/08/2020   Td 02/04/2015   Tdap 02/04/2015   Tetanus 02/04/2015     Objective: Vital Signs: BP 116/83 (BP Location: Left Arm, Patient Position: Sitting, Cuff Size: Large)   Pulse 92   Resp 16   Ht 5\' 4"  (1.626 m)   Wt 237 lb 6.4 oz (107.7 kg)   BMI 40.75 kg/m    Physical Exam Vitals and nursing note reviewed.  Constitutional:      Appearance: She is well-developed.  HENT:     Head: Normocephalic and atraumatic.   Eyes:     Conjunctiva/sclera: Conjunctivae normal.  Cardiovascular:     Rate and Rhythm: Normal rate and regular rhythm.     Heart sounds: Normal heart sounds.  Pulmonary:     Effort: Pulmonary effort is normal.     Breath sounds: Normal breath sounds.  Abdominal:     General: Bowel sounds are normal.     Palpations: Abdomen is soft.  Musculoskeletal:     Cervical back: Normal range of motion.  Lymphadenopathy:     Cervical: No cervical adenopathy.  Skin:    General: Skin is warm and dry.     Capillary Refill: Capillary refill takes less than 2 seconds.  Neurological:     Mental  Status: She is alert and oriented to person, place, and time.  Psychiatric:        Behavior: Behavior normal.      Musculoskeletal Exam: C-spine, thoracic spine, lumbar spine have good range of motion.  Shoulder joints, elbow joints, wrist joints, MCPs, PIPs, DIPs have good range of motion with no synovitis.  Complete fist formation bilaterally.  Hip joints have good range of motion with no groin pain.  Knee joints have good range of motion with no warmth or effusion.  Ankle joints have good range of motion with no tenderness or joint swelling.  CDAI Exam: CDAI Score: -- Patient Global: --; Provider Global: -- Swollen: --; Tender: -- Joint Exam 06/27/2023   No joint exam has been documented for this visit   There is currently no information documented on the homunculus. Go to the Rheumatology activity and complete the homunculus joint exam.  Investigation: No additional findings.  Imaging: No results found.  Recent Labs: Lab Results  Component Value Date   WBC 5.5 10/15/2020   HGB 14.0 10/15/2020   PLT 307 10/15/2020   NA 140 10/15/2020   K 4.4 10/15/2020   CL 105 10/15/2020   CO2 26 10/15/2020   GLUCOSE 98 10/15/2020   BUN 20 10/15/2020   CREATININE 0.69 10/15/2020   BILITOT 0.3 10/15/2020   ALKPHOS 68 12/27/2019   AST 24 10/15/2020   ALT 31 (H) 10/15/2020   PROT 6.6 10/15/2020    PROT 6.9 10/15/2020   ALBUMIN 3.6 12/27/2019   CALCIUM 9.0 10/15/2020   GFRAA 128 10/15/2020    Speciality Comments: No specialty comments available.  Procedures:  No procedures performed Allergies: Aspirin, Chlorhexidine, Esomeprazole, Nsaids, Chlorhexidine gluconate, Esomeprazole magnesium, and Iodinated contrast media   Assessment / Plan:     Visit Diagnoses: Elevated sed rate -sed rate was 30 on 04/14/2023.  Patient has history of elevated sed rate.  High risk She continues to experience intermittent arthralgias.  She takes meloxicam very sparingly.  Overall the pain in her hands has improved since her last office visit.  She has fleeting pains involving the bilateral first MCP joints.  No synovitis was noted on examination today.  She is able to make a complete fist bilaterally.  Plan to check sed rate and CRP today.  Discussed an ultrasound to assess for synovitis but she would like to hold off at this time.  She will notify us if her symptoms persist or worsen.- Plan: Sedimentation rate, C-reactive protein  Sicca complex Christy Alexander) - June 08, 2020 ANA negative, recent labs showed SSA antibody negative, SSB antibody negative. Discussed the use of over-the-counter products including Systane eyedrops, refresh eyedrops, Biotene products, and XyliMelts.  Recommended seeing ophthalmology and dentist every 6 months.  Primary osteoarthritis of both hands: X-rays of both hands from 10/15/2020 were consistent with early osteoarthritis.  No erosive changes were noted at that time.  Rheumatoid factor and anti-CCP were negative on 10/15/2020.  Patient has longstanding history of elevated sed rate.  Sed rate was 30 on 04/14/2023.  Plan to check sed rate and CRP today.  Overall her hand pain has improved since her last office visit.  She has intermittent pains involving the first MCP joints bilaterally especially with gripping and certain activities.  Discussed scheduling an ultrasound to assess for  synovitis pending lab results today.  Primary osteoarthritis of both feet: X-rays of both feet obtained on 10/15/2020 were consistent with osteoarthritis.  No erosive changes were noted at that time.  She has good range of motion of both ankle joints with no tenderness or synovitis today.  Idiopathic urticaria - She had a good response to Zyrtec.  C1 esterase inhibitor function is normal.  She is followed by an allergist.  No recurrence of urticaria recently.   Lumbosacral disc disease: No symptoms of radiculopathy.  She takes meloxicam sparingly for lower back pain.  Other medical conditions are listed as follows:   Vitamin D deficiency  History of hyperlipidemia  Laryngopharyngeal reflux (LPR)  History of anxiety  Chronic constipation  Elevated LFTs - AST mildly elevated at 31.  Patient states she had ultrasound of her abdomen which showed  fatty liver.  H/O partial thyroidectomy  Microhematuria  Orders: Orders Placed This Encounter  Procedures   Sedimentation rate   C-reactive protein   No orders of the defined types were placed in this encounter.   Follow-Up Instructions: Return in about 6 months (around 12/26/2023) for Osteoarthritis.   Gearldine Bienenstock, PA-C  Note - This record has been created using Dragon software.  Chart creation errors have been sought, but may not always  have been located. Such creation errors do not reflect on  the standard of medical care.

## 2023-06-27 ENCOUNTER — Ambulatory Visit: Payer: 59 | Attending: Physician Assistant | Admitting: Physician Assistant

## 2023-06-27 ENCOUNTER — Encounter: Payer: Self-pay | Admitting: Physician Assistant

## 2023-06-27 VITALS — BP 116/83 | HR 92 | Resp 16 | Ht 64.0 in | Wt 237.4 lb

## 2023-06-27 DIAGNOSIS — L501 Idiopathic urticaria: Secondary | ICD-10-CM

## 2023-06-27 DIAGNOSIS — Z8639 Personal history of other endocrine, nutritional and metabolic disease: Secondary | ICD-10-CM

## 2023-06-27 DIAGNOSIS — M19041 Primary osteoarthritis, right hand: Secondary | ICD-10-CM

## 2023-06-27 DIAGNOSIS — E890 Postprocedural hypothyroidism: Secondary | ICD-10-CM

## 2023-06-27 DIAGNOSIS — Z8659 Personal history of other mental and behavioral disorders: Secondary | ICD-10-CM

## 2023-06-27 DIAGNOSIS — M519 Unspecified thoracic, thoracolumbar and lumbosacral intervertebral disc disorder: Secondary | ICD-10-CM

## 2023-06-27 DIAGNOSIS — R7989 Other specified abnormal findings of blood chemistry: Secondary | ICD-10-CM

## 2023-06-27 DIAGNOSIS — M19072 Primary osteoarthritis, left ankle and foot: Secondary | ICD-10-CM

## 2023-06-27 DIAGNOSIS — M19071 Primary osteoarthritis, right ankle and foot: Secondary | ICD-10-CM

## 2023-06-27 DIAGNOSIS — M3500 Sicca syndrome, unspecified: Secondary | ICD-10-CM | POA: Diagnosis not present

## 2023-06-27 DIAGNOSIS — R700 Elevated erythrocyte sedimentation rate: Secondary | ICD-10-CM | POA: Diagnosis not present

## 2023-06-27 DIAGNOSIS — K219 Gastro-esophageal reflux disease without esophagitis: Secondary | ICD-10-CM

## 2023-06-27 DIAGNOSIS — E559 Vitamin D deficiency, unspecified: Secondary | ICD-10-CM

## 2023-06-27 DIAGNOSIS — M19042 Primary osteoarthritis, left hand: Secondary | ICD-10-CM

## 2023-06-27 DIAGNOSIS — K5909 Other constipation: Secondary | ICD-10-CM

## 2023-06-27 DIAGNOSIS — R3129 Other microscopic hematuria: Secondary | ICD-10-CM

## 2023-06-28 DIAGNOSIS — R7303 Prediabetes: Secondary | ICD-10-CM | POA: Insufficient documentation

## 2023-06-28 DIAGNOSIS — E7800 Pure hypercholesterolemia, unspecified: Secondary | ICD-10-CM | POA: Insufficient documentation

## 2023-06-28 LAB — C-REACTIVE PROTEIN: CRP: 30.7 mg/L — ABNORMAL HIGH (ref <8.0)

## 2023-06-28 LAB — SEDIMENTATION RATE: Sed Rate: 28 mm/h — ABNORMAL HIGH (ref 0–20)

## 2023-06-28 NOTE — Progress Notes (Signed)
ESR and CRP remains elevated.  Please clarify if she would like to schedule an ultrasound to assess for synovitis?

## 2023-07-03 DIAGNOSIS — M25562 Pain in left knee: Secondary | ICD-10-CM | POA: Insufficient documentation

## 2023-07-07 ENCOUNTER — Other Ambulatory Visit: Payer: Self-pay | Admitting: Family Medicine

## 2023-07-07 DIAGNOSIS — R0989 Other specified symptoms and signs involving the circulatory and respiratory systems: Secondary | ICD-10-CM

## 2023-07-10 ENCOUNTER — Other Ambulatory Visit: Payer: Self-pay

## 2023-07-10 ENCOUNTER — Other Ambulatory Visit (HOSPITAL_COMMUNITY): Payer: Self-pay

## 2023-07-11 ENCOUNTER — Inpatient Hospital Stay
Admission: RE | Admit: 2023-07-11 | Discharge: 2023-07-11 | Discharge status: Home / Self Care | Payer: 59 | Source: Ambulatory Visit | Attending: Family Medicine | Admitting: Family Medicine

## 2023-07-11 DIAGNOSIS — R0989 Other specified symptoms and signs involving the circulatory and respiratory systems: Secondary | ICD-10-CM

## 2023-07-13 ENCOUNTER — Encounter (HOSPITAL_COMMUNITY): Payer: Self-pay

## 2023-07-13 ENCOUNTER — Emergency Department (HOSPITAL_COMMUNITY)
Admission: EM | Admit: 2023-07-13 | Discharge: 2023-07-13 | Disposition: A | Discharge status: Home / Self Care | Payer: 59 | Attending: Emergency Medicine | Admitting: Emergency Medicine

## 2023-07-13 ENCOUNTER — Other Ambulatory Visit: Payer: Self-pay

## 2023-07-13 ENCOUNTER — Other Ambulatory Visit (HOSPITAL_COMMUNITY): Payer: Self-pay

## 2023-07-13 ENCOUNTER — Emergency Department (HOSPITAL_COMMUNITY): Payer: 59

## 2023-07-13 DIAGNOSIS — R102 Pelvic and perineal pain: Secondary | ICD-10-CM | POA: Insufficient documentation

## 2023-07-13 LAB — WET PREP, GENITAL
Clue Cells Wet Prep HPF POC: NONE SEEN
Sperm: NONE SEEN
Trich, Wet Prep: NONE SEEN
WBC, Wet Prep HPF POC: <10 (ref <10)
Yeast Wet Prep HPF POC: NONE SEEN

## 2023-07-13 LAB — COMPREHENSIVE METABOLIC PANEL
ALT: 37 U/L (ref 0–44)
AST: 30 U/L (ref 15–41)
Albumin: 3.6 g/dL (ref 3.5–5.0)
Alkaline Phosphatase: 48 U/L (ref 38–126)
Anion gap: 10 (ref 5–15)
BUN: 14 mg/dL (ref 6–20)
CO2: 22 mmol/L (ref 22–32)
Calcium: 8.7 mg/dL — ABNORMAL LOW (ref 8.9–10.3)
Chloride: 102 mmol/L (ref 98–111)
Creatinine, Ser: 0.65 mg/dL (ref 0.44–1.00)
GFR, Estimated: >60 mL/min (ref >60)
Glucose, Bld: 100 mg/dL — ABNORMAL HIGH (ref 70–99)
Potassium: 3.7 mmol/L (ref 3.5–5.1)
Sodium: 134 mmol/L — ABNORMAL LOW (ref 135–145)
Total Bilirubin: 0.4 mg/dL (ref 0.3–1.2)
Total Protein: 7 g/dL (ref 6.5–8.1)

## 2023-07-13 LAB — URINALYSIS, ROUTINE W REFLEX MICROSCOPIC
Bilirubin Urine: NEGATIVE
Glucose, UA: NEGATIVE mg/dL
Ketones, ur: 5 mg/dL — AB
Leukocytes,Ua: NEGATIVE
Nitrite: NEGATIVE
Protein, ur: 30 mg/dL — AB
RBC / HPF: >50 RBC/hpf (ref 0–5)
Specific Gravity, Urine: 1.031 — ABNORMAL HIGH (ref 1.005–1.030)
pH: 6 (ref 5.0–8.0)

## 2023-07-13 LAB — CBG MONITORING, ED: Glucose-Capillary: 78 mg/dL (ref 70–99)

## 2023-07-13 LAB — HIV ANTIBODY (ROUTINE TESTING W REFLEX): HIV Screen 4th Generation wRfx: NONREACTIVE

## 2023-07-13 LAB — HCG, SERUM, QUALITATIVE: Preg, Serum: NEGATIVE

## 2023-07-13 NOTE — ED Triage Notes (Signed)
Pt began having spotting and severe left sided pelvic pain. OTC meds do not help. Has nausea but no vomiting.denies dysuria, or flank pain. Denies Fever chills SOB.

## 2023-07-13 NOTE — ED Provider Notes (Signed)
[place] EMERGENCY DEPARTMENT AT [facility] Provider Note   CSN: [id number] Arrival date & time: 07/13/23  1650     History  Chief Complaint  Patient presents with   Pelvic Pain    Left sided    Christy Alexander is a 42 y.o. female.  Patient with past medical history significant for L4-L5 back pain, GERD, anxiety presents to the emergency department complaining of left-sided pelvic pain which began at noon this afternoon.  Patient states she began spotting as part of her normal menstrual cycle today and took her Midol which usually alleviates symptoms.  She states symptoms did not subside.  Pain increased in the early afternoon to 10 out of 10 in severity.  She did endorse some associated nausea which she attributed to the pain.  While in triage her pain completely subsided.  The patient is in a monogamous relationship and has not been sexually active for the past 6 months.  She denies other abdominal pain, nausea, vomiting, chest pain, shortness of breath, fever, vaginal discharge, urinary symptoms at this time.   Pelvic Pain       Home Medications Prior to Admission medications   Medication Sig Start Date End Date Taking? Authorizing Provider  Ascorbic Acid (VITAMIN C) 1000 MG tablet Take 1,000 mg by mouth at bedtime.    [provider]  cetirizine (ZYRTEC) 10 MG tablet Take 10 mg by mouth at bedtime.     [provider]  cyclobenzaprine (FLEXERIL) 10 MG tablet Take 1 tablet (10 mg total) by mouth 2 (two) times daily as needed for muscle spasms. 07/10/22   Tomi Bamberger, PA-C  diphenhydrAMINE (BENADRYL) 2 % cream Apply 1 application topically 3 (three) times daily as needed (for hives.).    [provider]  diphenhydrAMINE (BENADRYL) 25 mg capsule Take 25-50 mg by mouth every 6 (six) hours as needed (for hives.).     [provider]  Lidocaine 4 % KIT Apply topically as needed.    [provider]  melatonin 5 MG TABS Take 5  mg by mouth at bedtime as needed.    [provider]  meloxicam (MOBIC) 7.5 MG tablet Take 1 tablet (7.5 mg) by mouth daily. Patient taking differently: Take 7.5 mg by mouth as needed. 04/10/23     metFORMIN (GLUCOPHAGE-XR) 500 MG 24 hr tablet Take 500 mg by mouth daily. Patient not taking: Reported on 04/25/2023 06/12/20   [provider]  metFORMIN (GLUCOPHAGE-XR) 500 MG 24 hr tablet Take 1 tablet (500 mg total) by mouth daily. 10/14/22     Norethindrone-Ethinyl Estradiol-Fe Biphas (LO LOESTRIN FE) 1 MG-10 MCG / 10 MCG tablet Take 1 tablet by mouth daily. 12/09/22     omeprazole (PRILOSEC) 40 MG capsule Take 1 capsule (40 mg total) by mouth 2 (two) times daily. 04/19/23     oxyCODONE-acetaminophen (PERCOCET) 10-325 MG tablet Take 1 tablet by mouth every 6 (six) hours as needed for pain. 07/19/22   Molpus, John, MD  VITAMIN D PO Take 2,000 Units by mouth daily.    [provider]      Allergies    Aspirin, Chlorhexidine, Esomeprazole, Nsaids, Chlorhexidine gluconate, Esomeprazole magnesium, and Iodinated contrast media    Review of Systems   Review of Systems  Genitourinary:  Positive for pelvic pain.    Physical Exam Updated Vital Signs BP 131/81 (BP Location: Right Arm)   Pulse 87   Temp 98.2 F (36.8 C) (Oral)   Resp 16  Ht 5\' 4"  (1.626 m)   Wt 105.7 kg   SpO2 100%   BMI 39.99 kg/m  Physical Exam Vitals and nursing note reviewed. Exam conducted with a chaperone present.  Constitutional:      General: She is not in acute distress.    Appearance: She is well-developed.  HENT:     Head: Normocephalic and atraumatic.  Eyes:     Conjunctiva/sclera: Conjunctivae normal.  Cardiovascular:     Rate and Rhythm: Normal rate.  Pulmonary:     Effort: Pulmonary effort is normal. No respiratory distress.  Abdominal:     Palpations: Abdomen is soft.     Tenderness: There is no abdominal tenderness.  Genitourinary:    General: Normal vulva.     Labia:         Right: No tenderness, lesion or injury.        Left: No tenderness, lesion or injury.      Vagina: Normal.     Cervix: Normal. No cervical motion tenderness or discharge.     Uterus: Normal.      Adnexa: Right adnexa normal.       Left: Tenderness present.      Comments: Small amount of dark red blood in vaginal vault Musculoskeletal:        General: No swelling.     Cervical back: Neck supple.  Skin:    General: Skin is warm and dry.     Capillary Refill: Capillary refill takes less than 2 seconds.  Neurological:     Mental Status: She is alert.  Psychiatric:        Mood and Affect: Mood normal.     ED Results / Procedures / Treatments   Labs (all labs ordered are listed, but only abnormal results are displayed) Labs Reviewed  URINALYSIS, ROUTINE W REFLEX MICROSCOPIC - Abnormal; Notable for the following components:      Result Value   APPearance HAZY (*)    Specific Gravity, Urine 1.031 (*)    Hgb urine dipstick LARGE (*)    Ketones, ur 5 (*)    Protein, ur 30 (*)    Bacteria, UA RARE (*)    All other components within normal limits  COMPREHENSIVE METABOLIC PANEL - Abnormal; Notable for the following components:   Sodium 134 (*)    Glucose, Bld 100 (*)    Calcium 8.7 (*)    All other components within normal limits  WET PREP, GENITAL  HCG, SERUM, QUALITATIVE  HIV ANTIBODY (ROUTINE TESTING W REFLEX)  RPR  CBC WITH DIFFERENTIAL/PLATELET  CBC WITH DIFFERENTIAL/PLATELET  CBG MONITORING, ED  GC/CHLAMYDIA PROBE AMP ([place]) NOT AT [facility]    EKG None  Radiology US Pelvis Complete  Result Date: 07/13/2023 CLINICAL DATA:  Pelvic pain. EXAM: TRANSABDOMINAL AND TRANSVAGINAL ULTRASOUND OF PELVIS DOPPLER ULTRASOUND OF OVARIES TECHNIQUE: Both transabdominal and transvaginal ultrasound examinations of the pelvis were performed. Transabdominal technique was performed for global imaging of the pelvis including uterus, ovaries, adnexal regions, and pelvic cul-de-sac. It was  necessary to proceed with endovaginal exam following the transabdominal exam to visualize the uterus. Color and duplex Doppler ultrasound was utilized to evaluate blood flow to the ovaries. COMPARISON:  02/05/2021. FINDINGS: Uterus Measurements: 8.7 x 4.2 x 5.2 cm = volume: 98.8 mL. A fibroid is noted in the anterior uterine fundus on the right measuring 1.3 x 0.8 x 1.1 cm. Endometrium Thickness: 4.0 mm.  No focal abnormality visualized. Right ovary Measurements: 3.6 x 3.8 x 3.2  cm = volume: 22.8 mL. A cystic region is noted in the right ovary measuring 2.9 x 2.4 x 2.4 cm, possible dominant follicle. Left ovary Measurements: 3.3 x 2.2 x 2.4 cm = volume: 9.2 mL. Cyst in the left ovary measuring 2.2 x 1.6 x 1.6 cm, possible dominant follicle Pulsed Doppler evaluation of both ovaries demonstrates normal low-resistance arterial and venous waveforms. Other findings No abnormal free fluid.  Trace amount of fluid in the cervix. IMPRESSION: 1. No evidence of ovarian torsion. 2. Uterine fibroid. Electronically Signed   By: Thornell Sartorius M.D.   On: 07/13/2023 20:09   US Transvaginal Non-OB  Result Date: 07/13/2023 CLINICAL DATA:  Pelvic pain. EXAM: TRANSABDOMINAL AND TRANSVAGINAL ULTRASOUND OF PELVIS DOPPLER ULTRASOUND OF OVARIES TECHNIQUE: Both transabdominal and transvaginal ultrasound examinations of the pelvis were performed. Transabdominal technique was performed for global imaging of the pelvis including uterus, ovaries, adnexal regions, and pelvic cul-de-sac. It was necessary to proceed with endovaginal exam following the transabdominal exam to visualize the uterus. Color and duplex Doppler ultrasound was utilized to evaluate blood flow to the ovaries. COMPARISON:  02/05/2021. FINDINGS: Uterus Measurements: 8.7 x 4.2 x 5.2 cm = volume: 98.8 mL. A fibroid is noted in the anterior uterine fundus on the right measuring 1.3 x 0.8 x 1.1 cm. Endometrium Thickness: 4.0 mm.  No focal abnormality visualized. Right ovary  Measurements: 3.6 x 3.8 x 3.2 cm = volume: 22.8 mL. A cystic region is noted in the right ovary measuring 2.9 x 2.4 x 2.4 cm, possible dominant follicle. Left ovary Measurements: 3.3 x 2.2 x 2.4 cm = volume: 9.2 mL. Cyst in the left ovary measuring 2.2 x 1.6 x 1.6 cm, possible dominant follicle Pulsed Doppler evaluation of both ovaries demonstrates normal low-resistance arterial and venous waveforms. Other findings No abnormal free fluid.  Trace amount of fluid in the cervix. IMPRESSION: 1. No evidence of ovarian torsion. 2. Uterine fibroid. Electronically Signed   By: Thornell Sartorius M.D.   On: 07/13/2023 20:09   [place] Art/Ven Flow Abd Pelv Doppler  Result Date: 07/13/2023 CLINICAL DATA:  Pelvic pain. EXAM: TRANSABDOMINAL AND TRANSVAGINAL ULTRASOUND OF PELVIS DOPPLER ULTRASOUND OF OVARIES TECHNIQUE: Both transabdominal and transvaginal ultrasound examinations of the pelvis were performed. Transabdominal technique was performed for global imaging of the pelvis including uterus, ovaries, adnexal regions, and pelvic cul-de-sac. It was necessary to proceed with endovaginal exam following the transabdominal exam to visualize the uterus. Color and duplex Doppler ultrasound was utilized to evaluate blood flow to the ovaries. COMPARISON:  02/05/2021. FINDINGS: Uterus Measurements: 8.7 x 4.2 x 5.2 cm = volume: 98.8 mL. A fibroid is noted in the anterior uterine fundus on the right measuring 1.3 x 0.8 x 1.1 cm. Endometrium Thickness: 4.0 mm.  No focal abnormality visualized. Right ovary Measurements: 3.6 x 3.8 x 3.2 cm = volume: 22.8 mL. A cystic region is noted in the right ovary measuring 2.9 x 2.4 x 2.4 cm, possible dominant follicle. Left ovary Measurements: 3.3 x 2.2 x 2.4 cm = volume: 9.2 mL. Cyst in the left ovary measuring 2.2 x 1.6 x 1.6 cm, possible dominant follicle Pulsed Doppler evaluation of both ovaries demonstrates normal low-resistance arterial and venous waveforms. Other findings No abnormal free fluid.   Trace amount of fluid in the cervix. IMPRESSION: 1. No evidence of ovarian torsion. 2. Uterine fibroid. Electronically Signed   By: Thornell Sartorius M.D.   On: 07/13/2023 20:09    Procedures Procedures    Medications Ordered in ED  Medications - No data to display  ED Course/ Medical Decision Making/ A&P                                 Medical Decision Making  This patient presents to the ED for concern of pelvic pain, this involves an extensive number of treatment options, and is a complaint that carries with it a high risk of complications and morbidity.  The differential diagnosis includes torsion, TOA, ruptured cyst, mittelschmerz, others   Co morbidities that complicate the patient evaluation  History of low back pain, anxiety   Additional history obtained:  Additional history obtained from family at bedside External records from outside source obtained and reviewed including family medicine notes from October 8   Lab Tests:  I Ordered, and personally interpreted labs.  The pertinent results include: Wet prep unremarkable, CMP, UA unremarkable    Imaging Studies ordered:  I ordered imaging studies including pelvic ultrasound with Doppler I independently visualized and interpreted imaging which showed no evidence of torsion, uterine fibroid I agree with the radiologist interpretation    Test / Admission - Considered:  Patient's pain subsided complete without intervention.  She did have some mild left-sided adnexal tenderness which was evaluated by ultrasound with no acute findings.  At this time no signs of PID, no TOA or torsion on imaging.  Feel that patient has been appropriately stabilized for discharge with recommendations for follow-up with GYN.  Patient is in agreement with plan.  Return precautions provided included repeat/worsening pelvic pain.  Discharge home.         Final Clinical Impression(s) / ED Diagnoses Final diagnoses:  Pelvic pain in female     Rx / DC Orders ED Discharge Orders     None         Pamala Duffel 07/13/23 2328    Sloan Leiter, DO 07/16/23 [id number]

## 2023-07-13 NOTE — Discharge Instructions (Addendum)
Your workup tonight was reassuring.  Please follow-up with GYN for further evaluation and management as needed.  If you develop worsening pelvic pain or other life-threatening symptoms please return to the emergency department.

## 2023-07-13 NOTE — ED Provider Triage Note (Signed)
Emergency Medicine Provider Triage Evaluation Note  Christy Alexander , a 42 y.o. female  was evaluated in triage.  Pt complains of pelvic pain. Acute onset of pain to left pelvic earlier today.  Now having vaginal spotting. Pain not improved with midol.  Endorse mild nausea.  No dysuria, vaginal discharge, no hematuria.   Review of Systems  Positive: As above Negative: As above  Physical Exam  BP (!) 151/95 (BP Location: Right Arm)   Pulse 100   Temp 98 F (36.7 C)   Resp 18   Ht 5\' 4"  (1.626 m)   Wt 105.7 kg   SpO2 100%   BMI 39.99 kg/m  Gen:   Awake, no distress   Resp:  Normal effort  MSK:   Moves extremities without difficulty  Other:    Medical Decision Making  Medically screening exam initiated at 5:22 PM.  Appropriate orders placed.  Christy Alexander was informed that the remainder of the evaluation will be completed by another provider, this initial triage assessment does not replace that evaluation, and the importance of remaining in the ED until their evaluation is complete.     Christy Helper, PA-C 07/13/23 1723

## 2023-07-14 LAB — RPR: RPR Ser Ql: NONREACTIVE

## 2023-07-17 LAB — GC/CHLAMYDIA PROBE AMP (~~LOC~~) NOT AT ARMC
Chlamydia: NEGATIVE
Comment: NEGATIVE
Comment: NORMAL
Neisseria Gonorrhea: NEGATIVE

## 2023-07-18 ENCOUNTER — Other Ambulatory Visit (HOSPITAL_COMMUNITY): Payer: Self-pay

## 2023-07-18 ENCOUNTER — Other Ambulatory Visit: Payer: Self-pay

## 2023-07-18 MED ORDER — NORETHIN ACE-ETH ESTRAD-FE 1-20 MG-MCG(24) PO CHEW: CHEWABLE_TABLET | ORAL | 3 refills | Status: DC

## 2023-07-18 MED ORDER — NORETHIN ACE-ETH ESTRAD-FE 1-20 MG-MCG(24) PO CHEW
CHEWABLE_TABLET | ORAL | 3 refills | Status: DC
  Filled 2023-07-18: qty 84, 84d supply, fill #0
  Filled 2023-09-25: qty 84, 84d supply, fill #1

## 2023-07-18 MED ORDER — NYSTATIN 100000 UNIT/GM EX POWD
CUTANEOUS | 1 refills | Status: AC
  Filled 2023-07-18: qty 60, 30d supply, fill #0

## 2023-08-09 ENCOUNTER — Other Ambulatory Visit (HOSPITAL_COMMUNITY): Payer: Self-pay

## 2023-09-26 ENCOUNTER — Other Ambulatory Visit: Payer: Self-pay

## 2023-10-23 ENCOUNTER — Other Ambulatory Visit (HOSPITAL_COMMUNITY): Payer: Self-pay

## 2023-10-23 MED ORDER — FLUCONAZOLE 150 MG PO TABS
ORAL_TABLET | ORAL | 0 refills | Status: DC
  Filled 2023-10-23: qty 2, 3d supply, fill #0

## 2023-10-23 MED ORDER — NORETHIN ACE-ETH ESTRAD-FE 1-20 MG-MCG(24) PO CHEW
1.0000 | CHEWABLE_TABLET | Freq: Every day | ORAL | 3 refills | Status: DC
  Filled 2023-10-23 – 2023-12-13 (×2): qty 84, 84d supply, fill #0

## 2023-10-23 MED ORDER — METFORMIN HCL ER 500 MG PO TB24
500.0000 mg | ORAL_TABLET | Freq: Every day | ORAL | 3 refills | Status: DC
  Filled 2023-10-23: qty 90, 90d supply, fill #0
  Filled 2024-01-19: qty 90, 90d supply, fill #1
  Filled 2024-04-20: qty 90, 90d supply, fill #2
  Filled 2024-07-27: qty 90, 90d supply, fill #3

## 2023-10-25 ENCOUNTER — Other Ambulatory Visit (HOSPITAL_COMMUNITY): Payer: Self-pay

## 2023-10-25 MED ORDER — OMEPRAZOLE 40 MG PO CPDR
40.0000 mg | DELAYED_RELEASE_CAPSULE | Freq: Every day | ORAL | 1 refills | Status: DC
  Filled 2023-10-25: qty 90, 90d supply, fill #0
  Filled 2023-12-13 – 2023-12-16 (×2): qty 90, 90d supply, fill #1

## 2023-10-25 MED ORDER — CEPHALEXIN 500 MG PO CAPS
500.0000 mg | ORAL_CAPSULE | Freq: Three times a day (TID) | ORAL | 0 refills | Status: DC
  Filled 2023-10-25: qty 21, 7d supply, fill #0

## 2023-10-31 ENCOUNTER — Other Ambulatory Visit: Payer: Self-pay | Admitting: Family Medicine

## 2023-10-31 DIAGNOSIS — Z8639 Personal history of other endocrine, nutritional and metabolic disease: Secondary | ICD-10-CM

## 2023-11-08 ENCOUNTER — Other Ambulatory Visit: Payer: 59

## 2023-11-09 ENCOUNTER — Other Ambulatory Visit: Payer: 59

## 2023-11-09 ENCOUNTER — Ambulatory Visit
Admission: RE | Admit: 2023-11-09 | Discharge: 2023-11-09 | Disposition: A | Discharge status: Home / Self Care | Payer: 59 | Source: Ambulatory Visit | Attending: Family Medicine | Admitting: Family Medicine

## 2023-11-09 DIAGNOSIS — Z8639 Personal history of other endocrine, nutritional and metabolic disease: Secondary | ICD-10-CM

## 2023-11-17 ENCOUNTER — Ambulatory Visit
Admission: EM | Admit: 2023-11-17 | Discharge: 2023-11-17 | Disposition: A | Discharge status: Home / Self Care | Payer: 59 | Attending: Nurse Practitioner | Admitting: Nurse Practitioner

## 2023-11-17 ENCOUNTER — Encounter: Payer: Self-pay | Admitting: *Deleted

## 2023-11-17 DIAGNOSIS — J019 Acute sinusitis, unspecified: Secondary | ICD-10-CM

## 2023-11-17 DIAGNOSIS — B9689 Other specified bacterial agents as the cause of diseases classified elsewhere: Secondary | ICD-10-CM | POA: Diagnosis not present

## 2023-11-17 MED ORDER — AMOXICILLIN-POT CLAVULANATE 875-125 MG PO TABS: 1.0000 | ORAL_TABLET | Freq: Two times a day (BID) | ORAL | 0 refills | Status: DC

## 2023-11-17 NOTE — Discharge Instructions (Addendum)
You have a sinus infection.  Take the Augmentin as prescribed to treat it.  Symptoms should improve over the next week to 10 days.  If you develop chest pain or shortness of breath, go to the emergency room.  Some things that can make you feel better are: - Increased rest - Increasing fluid with water/sugar free electrolytes - Acetaminophen and ibuprofen as needed for fever/pain - Salt water gargling, chloraseptic spray and throat lozenges - OTC guaifenesin (Mucinex) 600 mg twice daily for congestion - Saline sinus flushes or a neti pot - Humidifying the air 

## 2023-11-17 NOTE — ED Provider Notes (Signed)
 [facility]    CSN: [id number] Arrival date & time: 11/17/23  1034      History   Chief Complaint Chief Complaint  Patient presents with   Nasal Congestion   Cough    HPI Christy Alexander is a 43 y.o. female.   Patient presents today with more than 2-week history of stuffy nose, sore throat, sinus pressure, headache, bilateral ear pain worse in the right ear that improved after deep planing yesterday, and fatigue.  She denies fever, bodyaches or chills, cough, shortness of breath or chest pain, abdominal pain, nausea/vomiting, diarrhea, and change in appetite.  Reports prior to her symptoms starting, she was around her brother who was sick.  She took a COVID-19 test shortly after her symptoms started that was negative.  She was taking Tylenol for the symptoms when she was feeling poorly, however has not taken anything in a few days for symptoms.  Reports while she has been sick, she was treated with Keflex for folliculitis and thinks that helped symptoms improve for a short time, then they significantly worsened a few days ago.  Reports she recently returned from [facility] for a business trip and had ear pain while on the plane.    Past Medical History:  Diagnosis Date   Anxiety    Back pain at L4-L5 level    BMI 30.0-30.9,adult    Chest pain    Constipation    Delayed pressure urticaria dx age 76   trauma to skin causes deep tissue  swelling   GERD (gastroesophageal reflux disease)    Hyperglycemia    Hyperlipidemia    Laryngopharyngeal reflux    Lower back pain    l4 to l5   Obesity    Right thyroid nodule    Seasonal allergies    TMJ (dislocation of temporomandibular joint)    Urticaria    Vitamin D deficiency     Patient Active Problem List   Diagnosis Date Noted   Microhematuria 07/21/2020   Urge incontinence 07/21/2020   Referred otalgia of both ears 10/31/2019   Throat discomfort 10/31/2019   Double vision 06/04/2019   Paresthesia 06/04/2019    Weakness 06/04/2019   Hyperglycemia 03/22/2019   Dysphonia 07/03/2018   H/O partial thyroidectomy 07/03/2018   Vitamin D deficiency 06/26/2018   Right thyroid nodule 01/27/2018   Multiple joint pain 05/29/2017   Hyperlipidemia 11/21/2016   Elevated C-reactive protein 06/30/2016   Laryngopharyngeal reflux (LPR) 05/10/2016   Obesity with body mass index 30 or greater 05/10/2016   AP (abdominal pain) 03/18/2016   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   Chronic constipation 03/08/2016   Fissure in skin 03/08/2016   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      Home Medications    Prior to Admission medications   Medication Sig Start Date End Date Taking? Authorizing Provider  amoxicillin-clavulanate (AUGMENTIN) 875-125 MG tablet Take  1 tablet by mouth 2 (two) times daily for 7 days. 11/17/23 11/24/23 Yes Valentino Nose, NP  Ascorbic Acid (VITAMIN C) 1000 MG tablet Take 1,000 mg by mouth at bedtime.   Yes [provider]  cetirizine (ZYRTEC) 10 MG tablet Take 10 mg by mouth at bedtime.    Yes [provider]  diphenhydrAMINE (BENADRYL) 2 % cream Apply 1 application topically 3 (three) times daily as needed (for hives.).    [provider]  diphenhydrAMINE (BENADRYL) 25 mg capsule Take 25-50 mg by mouth every 6 (six) hours as needed (for hives.).     [provider]  meloxicam  (MOBIC) 7.5 MG tablet Take 1 tablet (7.5 mg) by mouth daily. Patient taking differently: Take 7.5 mg by mouth as needed. 04/10/23     metFORMIN (GLUCOPHAGE-XR) 500 MG 24 hr tablet TAKE 1 TABLET BY MOUTH ONCE DAILY 10/23/23  Yes   Norethin Ace-Eth Estrad-FE (MINASTRIN 24 FE) 1-20 MG-MCG(24) CHEW Chew 1 tablet every day by oral route. 10/23/23  Yes   nystatin (MYCOSTATIN/NYSTOP) powder APPLY TO THE AFFECTED AREA(S) 2 TIMES PER DAY AS DIRECTED. 07/18/23     omeprazole (PRILOSEC) 40 MG capsule Take 1 capsule (40 mg total) by mouth daily. 10/25/23  Yes   VITAMIN D PO Take 2,000 Units by mouth daily.   Yes [provider]    Family History Family History  Problem Relation Age of Onset   Diabetes type II Mother    Heart disease Mother        CHF/CAD   Sleep apnea Mother    Kidney failure Mother    Tongue cancer Father    Glaucoma Father    Heart disease Father        CAD   Diabetes type II Brother    Cataracts Brother    Hypertension Brother    Anxiety disorder Brother    Depression Brother    Heart attack Maternal Grandmother    Heart disease Maternal Grandmother    Other Maternal Grandfather        worked in Haematologist mine   Colon cancer Paternal Grandmother    Diabetes Paternal Grandfather    Heart disease Paternal Grandfather    Allergic rhinitis Neg Hx    Angioedema Neg Hx    Asthma Neg Hx    Eczema Neg Hx    Immunodeficiency Neg Hx    Urticaria Neg Hx     Social History Social History   Tobacco Use   Smoking status: Never    Passive exposure: Never   Smokeless tobacco: Never  Vaping Use   Vaping status: Never Used  Substance Use Topics   Alcohol use: No   Drug use: No     Allergies   Aspirin, Chlorhexidine, Esomeprazole, Nsaids, Chlorhexidine gluconate, Esomeprazole magnesium, and Iodinated contrast media   Review of Systems Review of Systems Per HPI  Physical Exam Triage Vital Signs ED Triage Vitals  Encounter Vitals Group     BP 11/17/23 1101 137/86      Systolic BP Percentile --      Diastolic BP Percentile --      Pulse Rate 11/17/23 1101 96     Resp 11/17/23 1101 18     Temp 11/17/23 1101 98.3 F (36.8 C)     Temp Source 11/17/23 1101 Oral     SpO2 11/17/23 1101 96 %     Weight --      Height --  Head Circumference --      Peak Flow --      Pain Score 11/17/23 1057 0     Pain Loc --      Pain Education --      Exclude from Growth Chart --    No data found.  Updated Vital Signs BP 137/86 (BP Location: Right Arm)   Pulse 96   Temp 98.3 F (36.8 C) (Oral)   Resp 18   SpO2 96%   Visual Acuity Right Eye Distance:   Left Eye Distance:   Bilateral Distance:    Right Eye Near:   Left Eye Near:    Bilateral Near:     Physical Exam Vitals and nursing note reviewed.  Constitutional:      General: She is not in acute distress.    Appearance: Normal appearance. She is not ill-appearing or toxic-appearing.  HENT:     Head: Normocephalic and atraumatic.     Right Ear: Ear canal and external ear normal. A middle ear effusion is present. Tympanic membrane is erythematous.     Left Ear: Tympanic membrane, ear canal and external ear normal.     Nose: Congestion and rhinorrhea present.     Right Sinus: Maxillary sinus tenderness present. No frontal sinus tenderness.     Left Sinus: Maxillary sinus tenderness present. No frontal sinus tenderness.     Mouth/Throat:     Mouth: Mucous membranes are moist.     Pharynx: Oropharynx is clear. No oropharyngeal exudate or posterior oropharyngeal erythema.  Eyes:     General: No scleral icterus.    Extraocular Movements: Extraocular movements intact.  Cardiovascular:     Rate and Rhythm: Normal rate and regular rhythm.  Pulmonary:     Effort: Pulmonary effort is normal. No respiratory distress.     Breath sounds: Normal breath sounds. No wheezing, rhonchi or rales.  Musculoskeletal:     Cervical back: Normal range of motion and neck supple.  Lymphadenopathy:     Cervical:  No cervical adenopathy.  Skin:    General: Skin is warm and dry.     Capillary Refill: Capillary refill takes less than 2 seconds.     Coloration: Skin is not jaundiced or pale.     Findings: No erythema or rash.  Neurological:     Mental Status: She is alert and oriented to person, place, and time.     Motor: No weakness.  Psychiatric:        Behavior: Behavior is cooperative.      UC Treatments / Results  Labs (all labs ordered are listed, but only abnormal results are displayed) Labs Reviewed - No data to display  EKG   Radiology No results found.  Procedures Procedures (including critical care time)  Medications Ordered in UC Medications - No data to display  Initial Impression / Assessment and Plan / UC Course  I have reviewed the triage vital signs and the nursing notes.  Pertinent labs & imaging results that were available during my care of the patient were reviewed by me and considered in my medical decision making (see chart for details).   Patient is well-appearing, normotensive, afebrile, not tachycardic, not tachypneic, oxygenating well on room air.    1. Acute bacterial sinusitis Treat with Augmentin twice daily for 7 days Other supportive care discussed with patient including guaifenesin, saline nasal spray Patient and I discussed corticosteroid use and no indication for this particular illness Return and ER precautions discussed  The  patient was given the opportunity to ask questions.  All questions answered to their satisfaction.  The patient is in agreement to this plan.    Final Clinical Impressions(s) / UC Diagnoses   Final diagnoses:  Acute bacterial sinusitis     Discharge Instructions      You have a sinus infection.  Take the Augmentin as prescribed to treat it.  Symptoms should improve over the next week to 10 days.  If you develop chest pain or shortness of breath, go to the emergency room.  Some things that can make you feel better  are: - Increased rest - Increasing fluid with water/sugar free electrolytes - Acetaminophen and ibuprofen as needed for fever/pain - Salt water gargling, chloraseptic spray and throat lozenges - OTC guaifenesin (Mucinex) 600 mg twice daily for congestion - Saline sinus flushes or a neti pot - Humidifying the air     ED Prescriptions     Medication Sig Dispense Auth. Provider   amoxicillin-clavulanate (AUGMENTIN) 875-125 MG tablet Take 1 tablet by mouth 2 (two) times daily for 7 days. 14 tablet Valentino Nose, NP      PDMP not reviewed this encounter.   Valentino Nose, NP 11/17/23 1141

## 2023-11-17 NOTE — ED Triage Notes (Signed)
 Pt states she has been having nasal congestion and chest congestion on 10/31/2023. She was on an antibiotic, keflex at that time so she thinks that helped make her sx not as bad. She took an at home COVID test on 12th which was neg. She isn't taking any meds for her sx.

## 2023-11-21 ENCOUNTER — Telehealth: Admitting: Physician Assistant

## 2023-11-21 DIAGNOSIS — J019 Acute sinusitis, unspecified: Secondary | ICD-10-CM | POA: Diagnosis not present

## 2023-11-21 DIAGNOSIS — B9689 Other specified bacterial agents as the cause of diseases classified elsewhere: Secondary | ICD-10-CM | POA: Diagnosis not present

## 2023-11-21 MED ORDER — FLUTICASONE PROPIONATE 50 MCG/ACT NA SUSP: 2.0000 | Freq: Every day | NASAL | 0 refills | Status: DC

## 2023-11-21 MED ORDER — AMOXICILLIN-POT CLAVULANATE 875-125 MG PO TABS: 1.0000 | ORAL_TABLET | Freq: Two times a day (BID) | ORAL | 0 refills | Status: DC

## 2023-11-21 MED ORDER — PREDNISONE 10 MG (21) PO TBPK: ORAL_TABLET | ORAL | 0 refills | Status: DC

## 2023-11-21 NOTE — Progress Notes (Signed)
 Virtual Visit Consent   Christy Alexander, you are scheduled for a virtual visit with a [facility] provider today. Just as with appointments in the office, your consent must be obtained to participate. Your consent will be active for this visit and any virtual visit you may have with one of our providers in the next 365 days. If you have a MyChart account, a copy of this consent can be sent to you electronically.  As this is a virtual visit, video technology does not allow for your provider to perform a traditional examination. This may limit your provider's ability to fully assess your condition. If your provider identifies any concerns that need to be evaluated in person or the need to arrange testing (such as labs, EKG, etc.), we will make arrangements to do so. Although advances in technology are sophisticated, we cannot ensure that it will always work on either your end or our end. If the connection with a video visit is poor, the visit may have to be switched to a telephone visit. With either a video or telephone visit, we are not always able to ensure that we have a secure connection.  By engaging in this virtual visit, you consent to the provision of healthcare and authorize for your insurance to be billed (if applicable) for the services provided during this visit. Depending on your insurance coverage, you may receive a charge related to this service.  I need to obtain your verbal consent now. Are you willing to proceed with your visit today? Christy Alexander has provided verbal consent on 11/21/2023 for a virtual visit (video or telephone). Margaretann Loveless, PA-C  Date: 11/21/2023 3:48 PM   Virtual Visit via Video Note   IMargaretann Loveless, connected with  Christy Alexander  ([id number], 1980-12-31) on 11/21/23 at  3:45 PM EST by a video-enabled telemedicine application and verified that I am speaking with the correct person using two identifiers.  Location: Patient: Virtual Visit Location Patient:  Home Provider: Virtual Visit Location Provider: Home Office   I discussed the limitations of evaluation and management by telemedicine and the availability of in person appointments. The patient expressed understanding and agreed to proceed.    History of Present Illness: Christy Alexander is a 43 y.o. who identifies as a female who was assigned female at birth, and is being seen today for sinus congestion.  HPI: Sinusitis This is a recurrent problem. The current episode started 1 to 4 weeks ago (Seen at [facility] on 11/17/23 and was given Augmentin; Sinus symptoms started on 10/31/23. Has also recently been on Keflex for folliculitis that was started on 10/30/23, helped sinus symptoms some, but returned once Keflex completed.). The problem is unchanged. There has been no fever. The pain is moderate. Associated symptoms include congestion, ear pain, headaches and sinus pressure. Pertinent negatives include no chills, coughing, hoarse voice or sore throat. Past treatments include antibiotics and oral decongestants (Guaifenesin, augmentin, sudafed). The treatment provided no relief.     Problems:  Patient Active Problem List   Diagnosis Date Noted   Microhematuria 07/21/2020   Urge incontinence 07/21/2020   Referred otalgia of both ears 10/31/2019   Throat discomfort 10/31/2019   Double vision 06/04/2019   Paresthesia 06/04/2019   Weakness 06/04/2019   Hyperglycemia 03/22/2019   Dysphonia 07/03/2018   H/O partial thyroidectomy 07/03/2018   Vitamin D deficiency 06/26/2018   Right thyroid nodule 01/27/2018   Multiple joint pain 05/29/2017   Hyperlipidemia 11/21/2016   Elevated C-reactive protein  06/30/2016   Laryngopharyngeal reflux (LPR) 05/10/2016   Obesity with body mass index 30 or greater 05/10/2016   AP (abdominal pain) 03/18/2016   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   Chronic constipation 03/08/2016   Fissure in skin 03/08/2016   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   Drug allergy 01/26/2016    Idiopathic urticaria 01/26/2016   Allergic rhinitis due to pollen 01/26/2016   Arthritis 01/26/2016   Anxiety 11/10/2015   Lumbosacral disc disease 02/27/2015    Allergies:  Allergies  Allergen Reactions   Aspirin Swelling and Other (See Comments)    hoarseness   Chlorhexidine Dermatitis and Rash   Esomeprazole Hives   Nsaids Other (See Comments)    Eyes swelling/loss of voice   Chlorhexidine Gluconate Other (See Comments) and Rash   Esomeprazole Magnesium Rash   Iodinated Contrast Media Hives   Medications:  Current Outpatient Medications:    amoxicillin-clavulanate (AUGMENTIN) 875-125 MG tablet, Take 1 tablet by mouth 2 (two) times daily., Disp: 6 tablet, Rfl: 0   fluticasone (FLONASE) 50 MCG/ACT nasal spray, Place 2 sprays into both nostrils daily., Disp: 16 g, Rfl: 0   predniSONE (STERAPRED UNI-PAK 21 TAB) 10 MG (21) TBPK tablet, 6 day taper; take as directed on package instructions, Disp: 21 tablet, Rfl: 0   Ascorbic Acid (VITAMIN C) 1000 MG tablet, Take 1,000 mg by mouth at bedtime., Disp: , Rfl:    cetirizine (ZYRTEC) 10 MG tablet, Take 10 mg by mouth at bedtime. , Disp: , Rfl:    diphenhydrAMINE (BENADRYL) 2 % cream, Apply 1 application topically 3 (three) times daily as needed (for hives.)., Disp: , Rfl:    diphenhydrAMINE (BENADRYL) 25 mg capsule, Take 25-50 mg by mouth every 6 (six) hours as needed (for hives.). , Disp: , Rfl:    meloxicam (MOBIC) 7.5 MG tablet, Take 1 tablet (7.5 mg) by mouth daily. (Patient taking differently: Take 7.5 mg by mouth as needed.), Disp: 100 tablet, Rfl: 3   metFORMIN (GLUCOPHAGE-XR) 500 MG 24 hr tablet, TAKE 1 TABLET BY MOUTH ONCE DAILY, Disp: 90 tablet, Rfl: 3   Norethin Ace-Eth Estrad-FE (MINASTRIN 24 FE) 1-20 MG-MCG(24) CHEW, Chew 1 tablet every day by oral route., Disp: 84 tablet, Rfl: 3   nystatin (MYCOSTATIN/NYSTOP) powder, APPLY TO THE AFFECTED AREA(S) 2 TIMES PER DAY AS DIRECTED., Disp: 60 g, Rfl: 1   omeprazole (PRILOSEC) 40 MG  capsule, Take 1 capsule (40 mg total) by mouth daily., Disp: 90 capsule, Rfl: 1   VITAMIN D PO, Take 2,000 Units by mouth daily., Disp: , Rfl:   Observations/Objective: Patient is well-developed, well-nourished in no acute distress.  Resting comfortably at home.  Head is normocephalic, atraumatic.  No labored breathing.  Speech is clear and coherent with logical content.  Patient is alert and oriented at baseline.    Assessment and Plan: 1. Acute bacterial sinusitis (Primary) - amoxicillin-clavulanate (AUGMENTIN) 875-125 MG tablet; Take 1 tablet by mouth 2 (two) times daily.  Dispense: 6 tablet; Refill: 0 - predniSONE (STERAPRED UNI-PAK 21 TAB) 10 MG (21) TBPK tablet; 6 day taper; take as directed on package instructions  Dispense: 21 tablet; Refill: 0 - fluticasone (FLONASE) 50 MCG/ACT nasal spray; Place 2 sprays into both nostrils daily.  Dispense: 16 g; Refill: 0  - Worsening symptoms that have not responded to medications in 5 days as expected.  - Will give 3 more days of Augmentin to complete a 10-day course - Add Prednisone 6 day taper - Add Flonase - Continue allergy medications.  -  Steam and humidifier can help - Stay well hydrated and get plenty of rest.  - Seek in person evaluation if no symptom improvement or if symptoms worsen   Follow Up Instructions: I discussed the assessment and treatment plan with the patient. The patient was provided an opportunity to ask questions and all were answered. The patient agreed with the plan and demonstrated an understanding of the instructions.  A copy of instructions were sent to the patient via MyChart unless otherwise noted below.    The patient was advised to call back or seek an in-person evaluation if the symptoms worsen or if the condition fails to improve as anticipated.    Margaretann Loveless, PA-C

## 2023-11-21 NOTE — Patient Instructions (Signed)
 Tajanae Swinger, thank you for joining Margaretann Loveless, PA-C for today's virtual visit.  While this provider is not your primary care provider (PCP), if your PCP is located in our provider database this encounter information will be shared with them immediately following your visit.   A [facility] MyChart account gives you access to today's visit and all your visits, tests, and labs performed at [facility] " click here if you don't have a [facility] MyChart account or go to [email or web address]  Consent: (Patient) Melane Maneri provided verbal consent for this virtual visit at the beginning of the encounter.  Current Medications:  Current Outpatient Medications:    amoxicillin-clavulanate (AUGMENTIN) 875-125 MG tablet, Take 1 tablet by mouth 2 (two) times daily., Disp: 6 tablet, Rfl: 0   fluticasone (FLONASE) 50 MCG/ACT nasal spray, Place 2 sprays into both nostrils daily., Disp: 16 g, Rfl: 0   predniSONE (STERAPRED UNI-PAK 21 TAB) 10 MG (21) TBPK tablet, 6 day taper; take as directed on package instructions, Disp: 21 tablet, Rfl: 0   Ascorbic Acid (VITAMIN C) 1000 MG tablet, Take 1,000 mg by mouth at bedtime., Disp: , Rfl:    cetirizine (ZYRTEC) 10 MG tablet, Take 10 mg by mouth at bedtime. , Disp: , Rfl:    diphenhydrAMINE (BENADRYL) 2 % cream, Apply 1 application topically 3 (three) times daily as needed (for hives.)., Disp: , Rfl:    diphenhydrAMINE (BENADRYL) 25 mg capsule, Take 25-50 mg by mouth every 6 (six) hours as needed (for hives.). , Disp: , Rfl:    meloxicam (MOBIC) 7.5 MG tablet, Take 1 tablet (7.5 mg) by mouth daily. (Patient taking differently: Take 7.5 mg by mouth as needed.), Disp: 100 tablet, Rfl: 3   metFORMIN (GLUCOPHAGE-XR) 500 MG 24 hr tablet, TAKE 1 TABLET BY MOUTH ONCE DAILY, Disp: 90 tablet, Rfl: 3   Norethin Ace-Eth Estrad-FE (MINASTRIN 24 FE) 1-20 MG-MCG(24) CHEW, Chew 1 tablet every day by oral route., Disp: 84 tablet, Rfl: 3   nystatin  (MYCOSTATIN/NYSTOP) powder, APPLY TO THE AFFECTED AREA(S) 2 TIMES PER DAY AS DIRECTED., Disp: 60 g, Rfl: 1   omeprazole (PRILOSEC) 40 MG capsule, Take 1 capsule (40 mg total) by mouth daily., Disp: 90 capsule, Rfl: 1   VITAMIN D PO, Take 2,000 Units by mouth daily., Disp: , Rfl:    Medications ordered in this encounter:  Meds ordered this encounter  Medications   amoxicillin-clavulanate (AUGMENTIN) 875-125 MG tablet    Sig: Take 1 tablet by mouth 2 (two) times daily.    Dispense:  6 tablet    Refill:  0    Supervising Provider:   Merrilee Jansky [[id number]]   predniSONE (STERAPRED UNI-PAK 21 TAB) 10 MG (21) TBPK tablet    Sig: 6 day taper; take as directed on package instructions    Dispense:  21 tablet    Refill:  0    Supervising Provider:   Merrilee Jansky [[id number]]   fluticasone (FLONASE) 50 MCG/ACT nasal spray    Sig: Place 2 sprays into both nostrils daily.    Dispense:  16 g    Refill:  0    Supervising Provider:   Merrilee Jansky [[id number]]     *If you need refills on other medications prior to your next appointment, please contact your pharmacy*  Follow-Up: Call back or seek an in-person evaluation if the symptoms worsen or if the condition fails to improve as anticipated.  [facility] [phone]  Other  Instructions Sinus Infection, Adult A sinus infection, also called sinusitis, is inflammation of your sinuses. Sinuses are hollow spaces in the bones around your face. Your sinuses are located: Around your eyes. In the middle of your forehead. Behind your nose. In your cheekbones. Mucus normally drains out of your sinuses. When your nasal tissues become inflamed or swollen, mucus can become trapped or blocked. This allows bacteria, viruses, and fungi to grow, which leads to infection. Most infections of the sinuses are caused by a virus. A sinus infection can develop quickly. It can last for up to 4 weeks (acute) or for more than 12 weeks (chronic).  A sinus infection often develops after a cold. What are the causes? This condition is caused by anything that creates swelling in the sinuses or stops mucus from draining. This includes: Allergies. Asthma. Infection from bacteria or viruses. Deformities or blockages in your nose or sinuses. Abnormal growths in the nose (nasal polyps). Pollutants, such as chemicals or irritants in the air. Infection from fungi. This is rare. What increases the risk? You are more likely to develop this condition if you: Have a weak body defense system (immune system). Do a lot of swimming or diving. Overuse nasal sprays. Smoke. What are the signs or symptoms? The main symptoms of this condition are pain and a feeling of pressure around the affected sinuses. Other symptoms include: Stuffy nose or congestion that makes it difficult to breathe through your nose. Thick yellow or greenish drainage from your nose. Tenderness, swelling, and warmth over the affected sinuses. A cough that may get worse at night. Decreased sense of smell and taste. Extra mucus that collects in the throat or the back of the nose (postnasal drip) causing a sore throat or bad breath. Tiredness (fatigue). Fever. How is this diagnosed? This condition is diagnosed based on: Your symptoms. Your medical history. A physical exam. Tests to find out if your condition is acute or chronic. This may include: Checking your nose for nasal polyps. Viewing your sinuses using a device that has a light (endoscope). Testing for allergies or bacteria. Imaging tests, such as an MRI or CT scan. In rare cases, a bone biopsy may be done to rule out more serious types of fungal sinus disease. How is this treated? Treatment for a sinus infection depends on the cause and whether your condition is chronic or acute. If caused by a virus, your symptoms should go away on their own within 10 days. You may be given medicines to relieve symptoms. They  include: Medicines that shrink swollen nasal passages (decongestants). A spray that eases inflammation of the nostrils (topical intranasal corticosteroids). Rinses that help get rid of thick mucus in your nose (nasal saline washes). Medicines that treat allergies (antihistamines). Over-the-counter pain relievers. If caused by bacteria, your health care provider may recommend waiting to see if your symptoms improve. Most bacterial infections will get better without antibiotic medicine. You may be given antibiotics if you have: A severe infection. A weak immune system. If caused by narrow nasal passages or nasal polyps, surgery may be needed. Follow these instructions at home: Medicines Take, use, or apply over-the-counter and prescription medicines only as told by your health care provider. These may include nasal sprays. If you were prescribed an antibiotic medicine, take it as told by your health care provider. Do not stop taking the antibiotic even if you start to feel better. Hydrate and humidify  Drink enough fluid to keep your urine pale yellow. Staying hydrated  will help to thin your mucus. Use a cool mist humidifier to keep the humidity level in your home above 50%. Inhale steam for 10-15 minutes, 3-4 times a day, or as told by your health care provider. You can do this in the bathroom while a hot shower is running. Limit your exposure to cool or dry air. Rest Rest as much as possible. Sleep with your head raised (elevated). Make sure you get enough sleep each night. General instructions  Apply a warm, moist washcloth to your face 3-4 times a day or as told by your health care provider. This will help with discomfort. Use nasal saline washes as often as told by your health care provider. Wash your hands often with soap and water to reduce your exposure to germs. If soap and water are not available, use hand sanitizer. Do not smoke. Avoid being around people who are smoking  (secondhand smoke). Keep all follow-up visits. This is important. Contact a health care provider if: You have a fever. Your symptoms get worse. Your symptoms do not improve within 10 days. Get help right away if: You have a severe headache. You have persistent vomiting. You have severe pain or swelling around your face or eyes. You have vision problems. You develop confusion. Your neck is stiff. You have trouble breathing. These symptoms may be an emergency. Get help right away. Call 911. Do not wait to see if the symptoms will go away. Do not drive yourself to the hospital. Summary A sinus infection is soreness and inflammation of your sinuses. Sinuses are hollow spaces in the bones around your face. This condition is caused by nasal tissues that become inflamed or swollen. The swelling traps or blocks the flow of mucus. This allows bacteria, viruses, and fungi to grow, which leads to infection. If you were prescribed an antibiotic medicine, take it as told by your health care provider. Do not stop taking the antibiotic even if you start to feel better. Keep all follow-up visits. This is important. This information is not intended to replace advice given to you by your health care provider. Make sure you discuss any questions you have with your health care provider. Document Revised: 08/10/2021 Document Reviewed: 08/10/2021 Elsevier Patient Education  2024 [employer]   If you have been instructed to have an in-person evaluation today at a local Urgent Care facility, please use the link below. It will take you to a list of all of our available [facility], including address, phone number and hours of operation. Please do not delay care.  [facility]  If you or a family member do not have a primary care provider, use the link below to schedule a visit and establish care. When you choose a [facility] primary care physician or advanced practice provider, you  gain a long-term partner in health. Find a Primary Care Provider  Learn more about [facility]'s in-office and virtual care options: [facility] - Get Care Now

## 2023-11-28 ENCOUNTER — Encounter: Payer: Self-pay | Admitting: Emergency Medicine

## 2023-11-28 ENCOUNTER — Ambulatory Visit
Admission: EM | Admit: 2023-11-28 | Discharge: 2023-11-28 | Disposition: A | Discharge status: Home / Self Care | Attending: Nurse Practitioner | Admitting: Nurse Practitioner

## 2023-11-28 ENCOUNTER — Ambulatory Visit (HOSPITAL_COMMUNITY)
Admission: RE | Admit: 2023-11-28 | Discharge: 2023-11-28 | Disposition: A | Discharge status: Home / Self Care | Source: Ambulatory Visit | Attending: Nurse Practitioner | Admitting: Nurse Practitioner

## 2023-11-28 DIAGNOSIS — R059 Cough, unspecified: Secondary | ICD-10-CM | POA: Diagnosis present

## 2023-11-28 DIAGNOSIS — R0989 Other specified symptoms and signs involving the circulatory and respiratory systems: Secondary | ICD-10-CM | POA: Diagnosis not present

## 2023-11-28 DIAGNOSIS — R0602 Shortness of breath: Secondary | ICD-10-CM | POA: Diagnosis present

## 2023-11-28 NOTE — ED Provider Notes (Signed)
 [facility]    CSN: [id number] Arrival date & time: 11/28/23  1706      History   Chief Complaint No chief complaint on file.   HPI Christy Alexander is a 43 y.o. female.   The history is provided by the patient.   Patient presents for continued head congestion, shortness of breath and concern for pneumonia.  Patient was seen in this clinic on 2/28 and diagnosed with sinusitis.  She was prescribed Augmentin.  On 3 4, she completed the medications, but continued to experience symptoms, she completed a video visit at that time, she was prescribed an additional 3 days of Augmentin, prednisone, and Flonase.  Patient also reports prior to starting the antibiotics on 2/28, she was taking Keflex for folliculitis.  Patient states that she has not had new fever, but continues to have head congestion, she also reports pain in the left ear.  Denies fever, chills, wheezing, difficulty breathing, abdominal pain, nausea, vomiting, diarrhea, or rash.  Past Medical History:  Diagnosis Date   Anxiety    Back pain at L4-L5 level    BMI 30.0-30.9,adult    Chest pain    Constipation    Delayed pressure urticaria dx age 21   trauma to skin causes deep tissue  swelling   GERD (gastroesophageal reflux disease)    Hyperglycemia    Hyperlipidemia    Laryngopharyngeal reflux    Lower back pain    l4 to l5   Obesity    Right thyroid nodule    Seasonal allergies    TMJ (dislocation of temporomandibular joint)    Urticaria    Vitamin D deficiency     Patient Active Problem List   Diagnosis Date Noted   Microhematuria 07/21/2020   Urge incontinence 07/21/2020   Referred otalgia of both ears 10/31/2019   Throat discomfort 10/31/2019   Double vision 06/04/2019   Paresthesia 06/04/2019   Weakness 06/04/2019   Hyperglycemia 03/22/2019   Dysphonia 07/03/2018   H/O partial thyroidectomy 07/03/2018   Vitamin D deficiency 06/26/2018   Right thyroid nodule 01/27/2018   Multiple joint pain  05/29/2017   Hyperlipidemia 11/21/2016   Elevated C-reactive protein 06/30/2016   Laryngopharyngeal reflux (LPR) 05/10/2016   Obesity with body mass index 30 or greater 05/10/2016   AP (abdominal pain) 03/18/2016   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   Chronic constipation 03/08/2016   Fissure in skin 03/08/2016   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      Home Medications    Prior to Admission medications   Medication Sig Start Date End Date Taking? Authorizing Provider  Ascorbic Acid (VITAMIN C) 1000 MG tablet Take 1,000 mg by mouth at bedtime.    [provider]  cetirizine (ZYRTEC) 10 MG tablet Take 10 mg by mouth at bedtime.     [provider]  diphenhydrAMINE (BENADRYL) 2 % cream Apply 1  application topically 3 (three) times daily as needed (for hives.).    [provider]  diphenhydrAMINE (BENADRYL) 25 mg capsule Take 25-50 mg by mouth every 6 (six) hours as needed (for hives.).     [provider]  fluticasone (FLONASE) 50 MCG/ACT nasal spray Place 2 sprays into both nostrils daily. 11/21/23   Margaretann Loveless, PA-C  meloxicam (MOBIC) 7.5 MG tablet Take 1 tablet (7.5 mg) by mouth daily. Patient taking differently: Take 7.5 mg by mouth as needed. 04/10/23     metFORMIN (GLUCOPHAGE-XR) 500 MG 24 hr tablet TAKE 1 TABLET BY MOUTH ONCE DAILY 10/23/23     Norethin Ace-Eth  Estrad-FE (MINASTRIN 24 FE) 1-20 MG-MCG(24) CHEW Chew 1 tablet every day by oral route. 10/23/23     nystatin (MYCOSTATIN/NYSTOP) powder APPLY TO THE AFFECTED AREA(S) 2 TIMES PER DAY AS DIRECTED. 07/18/23     omeprazole (PRILOSEC) 40 MG capsule Take 1 capsule (40 mg total) by mouth daily. 10/25/23     VITAMIN D PO Take 2,000 Units by mouth daily.    [provider]    Family History Family History  Problem Relation Age of Onset   Diabetes type II Mother    Heart disease Mother        CHF/CAD   Sleep apnea Mother    Kidney failure Mother    Tongue cancer Father    Glaucoma Father    Heart disease Father        CAD   Diabetes type II Brother    Cataracts Brother    Hypertension Brother    Anxiety disorder Brother    Depression Brother    Heart attack Maternal Grandmother    Heart disease Maternal Grandmother    Other Maternal Grandfather        worked in Haematologist mine   Colon cancer Paternal Grandmother    Diabetes Paternal Grandfather    Heart disease Paternal Grandfather    Allergic rhinitis Neg Hx    Angioedema Neg Hx    Asthma Neg Hx    Eczema Neg Hx    Immunodeficiency Neg Hx    Urticaria Neg Hx     Social History Social History   Tobacco Use   Smoking status: Never    Passive exposure: Never   Smokeless tobacco: Never  Vaping Use   Vaping status: Never Used  Substance Use Topics   Alcohol use: No   Drug use: No     Allergies   Aspirin, Chlorhexidine, Esomeprazole, Nsaids, Chlorhexidine gluconate, Esomeprazole magnesium, and Iodinated contrast media   Review of Systems Review of Systems Per HPI  Physical Exam Triage Vital Signs ED Triage Vitals  Encounter Vitals Group     BP 11/28/23 1721 120/81     Systolic BP Percentile --      Diastolic BP Percentile --      Pulse Rate 11/28/23 1721 91     Resp 11/28/23 1721 18     Temp 11/28/23 1721 (!) 97.5 F (36.4 C)     Temp Source 11/28/23 1721 Oral     SpO2 11/28/23 1721 97 %     Weight --       Height --      Head Circumference --      Peak Flow --      Pain Score 11/28/23 1724 0     Pain Loc --      Pain Education --      Exclude from Growth Chart --  No data found.  Updated Vital Signs BP 120/81 (BP Location: Right Arm)   Pulse 91   Temp (!) 97.5 F (36.4 C) (Oral)   Resp 18   SpO2 97%   Visual Acuity Right Eye Distance:   Left Eye Distance:   Bilateral Distance:    Right Eye Near:   Left Eye Near:    Bilateral Near:     Physical Exam Vitals and nursing note reviewed.  Constitutional:      General: She is not in acute distress.    Appearance: Normal appearance.  HENT:     Head: Normocephalic.     Right Ear: Tympanic membrane, ear canal and external ear normal.     Left Ear: Tympanic membrane, ear canal and external ear normal.     Nose: Congestion present.  Eyes:     Extraocular Movements: Extraocular movements intact.     Conjunctiva/sclera: Conjunctivae normal.     Pupils: Pupils are equal, round, and reactive to light.  Cardiovascular:     Rate and Rhythm: Normal rate and regular rhythm.     Pulses: Normal pulses.     Heart sounds: Normal heart sounds.  Pulmonary:     Effort: Pulmonary effort is normal.     Breath sounds: Normal breath sounds.  Abdominal:     General: Bowel sounds are normal.     Palpations: Abdomen is soft.     Tenderness: There is no abdominal tenderness.  Musculoskeletal:     Cervical back: Normal range of motion.  Lymphadenopathy:     Cervical: No cervical adenopathy.  Skin:    General: Skin is warm and dry.  Neurological:     General: No focal deficit present.     Mental Status: She is alert and oriented to person, place, and time.  Psychiatric:        Mood and Affect: Mood normal.        Behavior: Behavior normal.      UC Treatments / Results  Labs (all labs ordered are listed, but only abnormal results are displayed) Labs Reviewed - No data to display  EKG   Radiology No results  found.  Procedures Procedures (including critical care time)  Medications Ordered in UC Medications - No data to display  Initial Impression / Assessment and Plan / UC Course  I have reviewed the triage vital signs and the nursing notes.  Pertinent labs & imaging results that were available during my care of the patient were reviewed by me and considered in my medical decision making (see chart for details).  Chest x-ray is pending.  Advised patient that it is recommended that she follow-up with her PCP as she has been on several antibiotics with no relief of her symptoms.  Patient is requesting a chest x-ray.  In the interim, patient advised to resume Sudafed, and to continue Zyrtec and Flonase at this time.  Supportive care recommendations were provided and discussed with the patient to include over-the-counter analgesics, and use of normal saline nasal spray.  Patient was in agreement with this plan of care and verbalizes understanding.  All questions were answered.  Patient stable for discharge.  Final Clinical Impressions(s) / UC Diagnoses   Final diagnoses:  Upper respiratory symptom     Discharge Instructions      Go to [facility] emergency department for your chest x-ray.  Please let them know that you are there for a chest x-ray only. Continue Zyrtec and Flonase daily.  Also  recommend resuming Sudafed. May use normal saline nasal spray throughout the day for nasal congestion and runny nose. As discussed, if your chest x-ray is negative, it is recommended that you follow-up with your primary care physician for further evaluation. Follow-up as needed.     ED Prescriptions   None    PDMP not reviewed this encounter.   Abran Cantor, NP 11/28/23 1756

## 2023-11-28 NOTE — Discharge Instructions (Signed)
 Go to [facility] emergency department for your chest x-ray.  Please let them know that you are there for a chest x-ray only. Continue Zyrtec and Flonase daily.  Also recommend resuming Sudafed. May use normal saline nasal spray throughout the day for nasal congestion and runny nose. As discussed, if your chest x-ray is negative, it is recommended that you follow-up with your primary care physician for further evaluation. Follow-up as needed.

## 2023-11-28 NOTE — ED Triage Notes (Signed)
 Seen on 2/28 for congestion and sore throat.  States she continues to have fluid in her head.  Has been using a nasal spray (Flonase) and taking guaifenesin to help dry up fluid.  Finished taking Augmentin on Sunday.

## 2023-11-29 ENCOUNTER — Telehealth: Payer: Self-pay | Admitting: Emergency Medicine

## 2023-11-29 NOTE — Telephone Encounter (Signed)
 Patient called and informed of negative chest x-ray results.  Patient verbalized understanding.

## 2023-12-06 ENCOUNTER — Other Ambulatory Visit (HOSPITAL_COMMUNITY): Payer: Self-pay

## 2023-12-06 ENCOUNTER — Other Ambulatory Visit: Payer: Self-pay

## 2023-12-06 MED ORDER — PREDNISONE 10 MG PO TABS
ORAL_TABLET | ORAL | 0 refills | Status: DC
  Filled 2023-12-06 (×2): qty 20, 8d supply, fill #0

## 2023-12-13 ENCOUNTER — Other Ambulatory Visit (HOSPITAL_COMMUNITY): Payer: Self-pay

## 2023-12-13 ENCOUNTER — Other Ambulatory Visit: Payer: Self-pay

## 2023-12-16 ENCOUNTER — Other Ambulatory Visit (HOSPITAL_COMMUNITY): Payer: Self-pay

## 2023-12-18 ENCOUNTER — Other Ambulatory Visit: Payer: Self-pay

## 2023-12-18 ENCOUNTER — Other Ambulatory Visit (HOSPITAL_COMMUNITY): Payer: Self-pay

## 2023-12-18 MED ORDER — OMEPRAZOLE 40 MG PO CPDR
40.0000 mg | DELAYED_RELEASE_CAPSULE | Freq: Two times a day (BID) | ORAL | 5 refills | Status: DC
  Filled 2023-12-18: qty 60, 30d supply, fill #0
  Filled 2024-01-19: qty 60, 30d supply, fill #1
  Filled 2024-02-13 (×2): qty 60, 30d supply, fill #2
  Filled 2024-03-26: qty 60, 30d supply, fill #3
  Filled 2024-04-20: qty 60, 30d supply, fill #4
  Filled 2024-05-20: qty 60, 30d supply, fill #5

## 2024-01-09 DIAGNOSIS — J309 Allergic rhinitis, unspecified: Secondary | ICD-10-CM | POA: Insufficient documentation

## 2024-01-09 DIAGNOSIS — J329 Chronic sinusitis, unspecified: Secondary | ICD-10-CM | POA: Insufficient documentation

## 2024-01-19 ENCOUNTER — Other Ambulatory Visit (HOSPITAL_COMMUNITY): Payer: Self-pay

## 2024-01-23 DIAGNOSIS — H9312 Tinnitus, left ear: Secondary | ICD-10-CM | POA: Insufficient documentation

## 2024-02-06 DIAGNOSIS — Z8639 Personal history of other endocrine, nutritional and metabolic disease: Secondary | ICD-10-CM | POA: Insufficient documentation

## 2024-02-13 ENCOUNTER — Other Ambulatory Visit (HOSPITAL_COMMUNITY): Payer: Self-pay

## 2024-02-28 ENCOUNTER — Other Ambulatory Visit: Payer: Self-pay

## 2024-02-28 ENCOUNTER — Other Ambulatory Visit (HOSPITAL_COMMUNITY): Payer: Self-pay

## 2024-02-28 MED ORDER — FLUCONAZOLE 150 MG PO TABS
150.0000 mg | ORAL_TABLET | ORAL | 0 refills | Status: DC
  Filled 2024-02-28 (×2): qty 2, 6d supply, fill #0

## 2024-02-28 MED ORDER — NORETHIN ACE-ETH ESTRAD-FE 1-20 MG-MCG PO TABS
1.0000 | ORAL_TABLET | Freq: Every day | ORAL | 3 refills | Status: DC
  Filled 2024-02-28 (×3): qty 84, 84d supply, fill #0
  Filled 2024-05-05: qty 84, 84d supply, fill #1
  Filled 2024-07-13: qty 28, 28d supply, fill #2

## 2024-02-29 ENCOUNTER — Other Ambulatory Visit (HOSPITAL_COMMUNITY): Payer: Self-pay

## 2024-02-29 ENCOUNTER — Other Ambulatory Visit: Payer: Self-pay

## 2024-03-12 ENCOUNTER — Other Ambulatory Visit (HOSPITAL_COMMUNITY): Payer: Self-pay

## 2024-03-13 ENCOUNTER — Other Ambulatory Visit (HOSPITAL_COMMUNITY): Payer: Self-pay

## 2024-03-15 ENCOUNTER — Other Ambulatory Visit: Payer: Self-pay

## 2024-03-15 ENCOUNTER — Other Ambulatory Visit (HOSPITAL_COMMUNITY): Payer: Self-pay

## 2024-03-16 ENCOUNTER — Other Ambulatory Visit (HOSPITAL_COMMUNITY): Payer: Self-pay

## 2024-03-18 ENCOUNTER — Other Ambulatory Visit (HOSPITAL_COMMUNITY): Payer: Self-pay

## 2024-03-20 ENCOUNTER — Other Ambulatory Visit (HOSPITAL_COMMUNITY): Payer: Self-pay

## 2024-03-21 ENCOUNTER — Other Ambulatory Visit (HOSPITAL_COMMUNITY): Payer: Self-pay

## 2024-03-25 ENCOUNTER — Other Ambulatory Visit (HOSPITAL_COMMUNITY): Payer: Self-pay

## 2024-03-26 ENCOUNTER — Other Ambulatory Visit (HOSPITAL_COMMUNITY): Payer: Self-pay

## 2024-03-29 ENCOUNTER — Other Ambulatory Visit (HOSPITAL_COMMUNITY): Payer: Self-pay

## 2024-04-03 ENCOUNTER — Other Ambulatory Visit (HOSPITAL_COMMUNITY): Payer: Self-pay

## 2024-04-04 ENCOUNTER — Other Ambulatory Visit (HOSPITAL_COMMUNITY): Payer: Self-pay

## 2024-04-05 ENCOUNTER — Other Ambulatory Visit (HOSPITAL_COMMUNITY): Payer: Self-pay

## 2024-04-06 ENCOUNTER — Other Ambulatory Visit (HOSPITAL_COMMUNITY): Payer: Self-pay

## 2024-04-09 ENCOUNTER — Other Ambulatory Visit (HOSPITAL_COMMUNITY): Payer: Self-pay

## 2024-04-09 NOTE — Progress Notes (Unsigned)
 Cardiology Office Note:    Date:  04/12/2024   ID:  Christy Alexander, DOB [birth year], MRN [id number]  PCP:  Gladystine Erminio CROME, MD   [facility] Providers Cardiologist:  Lonni Cash, MD Cardiology APP:  Madie Jon Garre, PA     Referring MD: Gladystine Erminio CROME, MD   Chief Complaint  Patient presents with   Follow-up    HTN, CP    History of Present Illness:    Christy Alexander is a 43 y.o. female with a hx of anxiety, GERD, and hyperlipidemia.  She has been followed for chest pain evaluation in 2023.  Coronary CTA 05/31/2022 showed a coronary calcium  score of 0 and no evidence of CAD.  Follow-up echocardiogram 05/2022 with preserved LVEF 60-65% and no valvular disease.  She presents today for routine cardiology follow-up.  She has not been seen since 2023. She tells me she actually made the appt due to CP with long walks.   She experiences mild chest discomfort described as tightness during long walks, occurring for about six months. The discomfort typically arises near the end of a 30-minute walk and is located in the center of her chest. It is not severe enough to stop her from walking but makes her feel like she cannot breathe. She has reduced her walking activities due to concern about this symptom.  Her blood pressure has been progressively increasing over the past few years, with readings around 140/90 mmHg, higher than her usual 120/80 mmHg, and diastolic pressure often in the 90s. She experiences significant fatigue and a rattling sensation in her chest every morning, which clears after coughing. She is concerned about the possibility of congestive heart failure, as her mother passed away from CHF last year.  She has a history of high C-reactive protein (CRP) levels for nearly ten years, with no clear explanation despite multiple referrals to rheumatology. She experiences burping and a burning sensation in her chest during walks, which she suspects might be related to  her GERD. She has not seen a pulmonologist but has always found it difficult to breathe during exercise, even after significant weight loss.   She has also had bad headaches - questions if this is related to hypertension.  She questions if all of this is related to COVID vaccination. She questions exercise related asthma.   Past Medical History:  Diagnosis Date   Anxiety    Back pain at L4-L5 level    BMI 30.0-30.9,adult    Chest pain    Constipation    Delayed pressure urticaria dx age 25   trauma to skin causes deep tissue  swelling   GERD (gastroesophageal reflux disease)    Hyperglycemia    Hyperlipidemia    Laryngopharyngeal reflux    Lower back pain    l4 to l5   Obesity    Right thyroid  nodule    Seasonal allergies    TMJ (dislocation of temporomandibular joint)    Urticaria    Vitamin D  deficiency     Past Surgical History:  Procedure Laterality Date   LUMBAR LAMINECTOMY  2006   L4 and L5 discectomy micro, hemilaminectomy, had conscious sedation for    THYROID  LOBECTOMY Right 02/01/2018   Procedure: RIGHT THYROID  LOBECTOMY;  Surgeon: Eletha Boas, MD;  Location: [facility];  Service: General;  Laterality: Right;   WISDOM TOOTH EXTRACTION  2001    Current Medications: Current Meds  Medication Sig   Ascorbic Acid (VITAMIN C) 1000 MG tablet Take 1,000 mg  by mouth at bedtime.   cetirizine (ZYRTEC) 10 MG tablet Take 10 mg by mouth at bedtime.    diphenhydrAMINE  (BENADRYL ) 2 % cream Apply 1 application topically 3 (three) times daily as needed (for hives.).   diphenhydrAMINE  (BENADRYL ) 25 mg capsule Take 25-50 mg by mouth every 6 (six) hours as needed (for hives.).    fluticasone  (FLONASE ) 50 MCG/ACT nasal spray Place 2 sprays into both nostrils daily.   meloxicam  (MOBIC ) 7.5 MG tablet Take 1 tablet (7.5 mg) by mouth daily. (Patient taking differently: Take 7.5 mg by mouth as needed.)   metFORMIN  (GLUCOPHAGE -XR) 500 MG 24 hr tablet TAKE 1 TABLET BY MOUTH ONCE DAILY    Norethin  Ace-Eth Estrad-FE (MINASTRIN 24 FE) 1-20 MG-MCG(24) CHEW Chew 1 tablet by mouth daily.   norethindrone -ethinyl estradiol -FE (JUNEL  FE 1/20) 1-20 MG-MCG tablet Take 1 tablet by mouth daily.   nystatin  (MYCOSTATIN /NYSTOP ) powder APPLY TO THE AFFECTED AREA(S) 2 TIMES PER DAY AS DIRECTED.   omeprazole  (PRILOSEC) 40 MG capsule Take 1 capsule (40 mg total) by mouth 2 (two) times daily.   VITAMIN D  PO Take 2,000 Units by mouth daily.     Allergies:   Aspirin, Chlorhexidine , Esomeprazole, Nsaids, Chlorhexidine  gluconate, Esomeprazole magnesium, and Iodinated contrast media   Social History   Socioeconomic History   Marital status: Married    Spouse name: Not on file   Number of children: 0   Years of education: college   Highest education level: Master's degree (e.g., MA, MS, MEng, MEd, MSW, MBA)  Occupational History   Occupation: Charity fundraiser   Occupation: Engineer, civil (consulting) works for [employer]  Tobacco Use   Smoking status: Never    Passive exposure: Never   Smokeless tobacco: Never  Vaping Use   Vaping status: Never Used  Substance and Sexual Activity   Alcohol use: No   Drug use: No   Sexual activity: Not Currently    Birth control/protection: Pill  Other Topics Concern   Not on file  Social History Narrative   Lives at home with her husband.   Right-handed.   1 cup coffee per day.   Social Drivers of Corporate investment banker Strain: Low Risk  (02/05/2024)   Received from [facility]   Overall Financial Resource Strain (CARDIA)    Difficulty of Paying Living Expenses: Not hard at all  Food Insecurity: No Food Insecurity (02/05/2024)   Received from [facility]   Hunger Vital Sign    Within the past 12 months, you worried that your food would run out before you got the money to buy more.: Never true    Within the past 12 months, the food you bought just didn't last and you didn't have money to get more.: Never true  Transportation Needs: No Transportation Needs (02/05/2024)    Received from [facility] - Transportation    Lack of Transportation (Medical): No    Lack of Transportation (Non-Medical): No  Physical Activity: Insufficiently Active (02/05/2024)   Received from [facility]   Exercise Vital Sign    On average, how many days per week do you engage in moderate to strenuous exercise (like a brisk walk)?: 3 days    On average, how many minutes do you engage in exercise at this level?: 30 min  Stress: No Stress Concern Present (02/05/2024)   Received from [facility] of Occupational [facility] - Occupational Stress Questionnaire    Feeling of Stress : Only a little  Recent  Concern: Stress - Stress Concern Present (11/28/2023)   Received from [facility] of Occupational Health - Occupational Stress Questionnaire    Feeling of Stress : To some extent  Social Connections: Moderately Integrated (02/05/2024)   Received from [facility]   Social Network    How would you rate your social network (family, work, friends)?: Adequate participation with social networks  Recent Concern: Social Connections - Somewhat Isolated (11/28/2023)   Received from [facility]   Social Network    How would you rate your social network (family, work, friends)?: Restricted participation with some degree of social isolation     Family History: The patient's family history includes Anxiety disorder in her brother; Cataracts in her brother; Colon cancer in her paternal grandmother; Depression in her brother; Diabetes in her paternal grandfather; Diabetes type II in her brother and mother; Glaucoma in her father; Heart attack in her maternal grandmother; Heart disease in her father, maternal grandmother, mother, and paternal grandfather; Hypertension in her brother; Kidney failure in her mother; Other in her maternal grandfather; Sleep apnea in her mother; Tongue cancer in her father. There is no history of Allergic rhinitis,  Angioedema, Asthma, Eczema, Immunodeficiency, or Urticaria.  ROS:   Please see the history of present illness.     All other systems reviewed and are negative.  EKGs/Labs/Other Studies Reviewed:    The following studies were reviewed today:  Echo 2023: 1. Left ventricular ejection fraction, by estimation, is 60 to 65%. The  left ventricle has normal function. The left ventricle has no regional  wall motion abnormalities. Left ventricular diastolic parameters were  normal.   2. Right ventricular systolic function is normal. The right ventricular  size is normal.   3. The mitral valve is normal in structure. No evidence of mitral valve  regurgitation. No evidence of mitral stenosis.   4. The aortic valve is tricuspid. Aortic valve regurgitation is not  visualized. No aortic stenosis is present.   5. The inferior vena cava is normal in size with greater than 50%  respiratory variability, suggesting right atrial pressure of 3 mmHg.  EKG Interpretation Date/Time:  Friday April 12 2024 08:12:36 EDT Ventricular Rate:  95 PR Interval:  144 QRS Duration:  84 QT Interval:  362 QTC Calculation: 454 R Axis:   6  Text Interpretation: Normal sinus rhythm Normal ECG When compared with ECG of 27-Dec-2019 18:36, PREVIOUS ECG IS PRESENT Confirmed by Madie Slough ([id number]) on 04/12/2024 8:18:16 AM    Recent Labs: 07/13/2023: ALT 37; BUN 14; Creatinine, Ser 0.65; Potassium 3.7; Sodium 134  Recent Lipid Panel No results found for: CHOL, TRIG, HDL, CHOLHDL, VLDL, LDLCALC, LDLDIRECT   Risk Assessment/Calculations:      HYPERTENSION CONTROL Vitals:   04/12/24 0805 04/12/24 0849  BP: (!) 128/90 (!) 128/100    The patient's blood pressure is elevated above target today.  In order to address the patient's elevated BP: Blood pressure will be monitored at home to determine if medication changes need to be made.            Physical Exam:    VS:  BP (!) 128/100   Pulse 95   Ht  5' 4 (1.626 m)   Wt 239 lb (108.4 kg)   SpO2 97%   BMI 41.02 kg/m     Wt Readings from Last 3 Encounters:  04/12/24 239 lb (108.4 kg)  07/13/23 233 lb (105.7 kg)  06/27/23 237 lb 6.4 oz (107.7  kg)     GEN:  Well nourished, well developed in no acute distress HEENT: Normal NECK: No JVD; No carotid bruits LYMPHATICS: No lymphadenopathy CARDIAC: RRR, no murmurs, rubs, gallops RESPIRATORY:  Clear to auscultation without rales, wheezing or rhonchi  ABDOMEN: Soft, non-tender, non-distended MUSCULOSKELETAL:  No edema; No deformity  SKIN: Warm and dry NEUROLOGIC:  Alert and oriented x 3 PSYCHIATRIC:  Normal affect   ASSESSMENT:    1. Shortness of breath   2. Chest pain of uncertain etiology   3. Primary hypertension   4. Exercise-induced asthma   5. Obesity with body mass index 30 or greater   6. Gastroesophageal reflux disease, unspecified whether esophagitis present    PLAN:    In order of problems listed above:  Chest Pain Intermittent chest discomfort possibly due to microvascular disease, esophageal spasm, or GERD. Previous cardiac evaluations normal. Differential includes cardiac and non-cardiac causes. - Order echocardiogram to reassess cardiac function. - will refer to gastroenterology and pulmonology for non-cardiac causes. - given prediabetes, need lipid panel - could consider hs-CRP with blood draw  Hypertension Mildly elevated blood pressure with diastolic hypertension. Discussed dietary sodium intake and medication options including amlodipine and ARBs. She expressed concern about amlodipine due to family history of CHF. - Start blood pressure log with consistent timing. - Consider amlodipine 2.5 mg at night if blood pressure remains elevated. - Discuss potential use of ARB with kidney function monitoring in the setting of prediabetes - Encourage dietary sodium restriction to <2000 mg/day.  GERD GERD symptoms potentially contributing to chest discomfort.  Current medication insufficient. - will refer to gastroenterology   Exercise-induced asthma (suspected) Suspected exercise-induced asthma due to exercise-related breathing difficulty and wheezing. - Refer to pulmonology for evaluation and management.  Obesity Obesity contributing to exercise difficulty and exacerbation of health issues. Limited by exercise-induced symptoms. - Encourage weight training and body weight exercises rather than cardio until she can be evaluated by pulmonology - pulmonology referral for exercise limitations.  Herniated disc Herniated disc history limits exercise options. - Encourage weight training and body weight exercises with caution.  Follow up with me in Sept.  Medication Adjustments/Labs and Tests Ordered: Current medicines are reviewed at length with the patient today.  Concerns regarding medicines are outlined above.  Orders Placed This Encounter  Procedures   Ambulatory referral to Pulmonology   Ambulatory referral to Gastroenterology   EKG 12-Lead   ECHOCARDIOGRAM COMPLETE   No orders of the defined types were placed in this encounter.   Patient Instructions  Medication Instructions:  No medication changes were made during today's visit.   Monitor your blood pressure daily or at least 4 times a week.  *If you need a refill on your cardiac medications before your next appointment, please call your pharmacy*   Lab Work: No labs were ordered during today's visit.  If you have labs (blood work) drawn today and your tests are completely normal, you will receive your results only by: MyChart Message (if you have MyChart) OR A paper copy in the mail If you have any lab test that is abnormal or we need to change your treatment, we will call you to review the results.   Testing/Procedures: Your physician has requested that you have an echocardiogram. Echocardiography is a painless test that uses sound waves to create images of your heart. It  provides your doctor with information about the size and shape of your heart and how well your heart's chambers and valves are working.  This procedure takes approximately one hour. There are no restrictions for this procedure. Please do NOT wear cologne, perfume, aftershave, or lotions (deodorant is allowed). Please arrive 15 minutes prior to your appointment time.  Please note: We ask at that you not bring children with you during ultrasound (echo/ vascular) testing. Due to room size and safety concerns, children are not allowed in the ultrasound rooms during exams. Our front office staff cannot provide observation of children in our lobby area while testing is being conducted. An adult accompanying a patient to their appointment will only be allowed in the ultrasound room at the discretion of the ultrasound technician under special circumstances. We apologize for any inconvenience.    Follow-Up: At [facility], you and your health needs are our priority.  As part of our continuing mission to provide you with exceptional heart care, we have created designated Provider Care Teams.  These Care Teams include your primary Cardiologist (physician) and Advanced Practice Providers (APPs -  Physician Assistants and Nurse Practitioners) who all work together to provide you with the care you need, when you need it.  We recommend signing up for the patient portal called MyChart.  Sign up information is provided on this After Visit Summary.  MyChart is used to connect with patients for Virtual Visits (Telemedicine).  Patients are able to view lab/test results, encounter notes, upcoming appointments, etc.  Non-urgent messages can be sent to your provider as well.   To learn more about what you can do with MyChart, go to [email or web address].    Your next appointment:  OFFICE VISIT at 10:05 AM (25 min)Arrive by 9:45 AM, Wednesday June 05, 2024     Provider:   Jon Hails, PA-C           Other Instructions Thank you for choosing [facility]!     BLOOD PRESSURE READINGS TAKE YOUR BLOOD PRESSURE AT LEAST 1 HOUR AFTER TAKING YOUR MEDICATION BRING LOG WITH YOU TO YOUR FOLLOW UP APPOINTMENT FOR REVIEW   DATE TIME BP HR COMMENT DATE  TIME  BP HR COMMENT  DATE TIME BP HR COMMENT DATE  TIME  BP HR COMMENT                                                                                                                                                                                                                                                                   Signed, Jon Nat Hails, [place]  04/12/2024 8:54 AM    [facility]

## 2024-04-12 ENCOUNTER — Other Ambulatory Visit (HOSPITAL_COMMUNITY): Payer: Self-pay

## 2024-04-12 ENCOUNTER — Ambulatory Visit: Attending: Physician Assistant | Admitting: Physician Assistant

## 2024-04-12 ENCOUNTER — Telehealth: Payer: Self-pay | Admitting: Physician Assistant

## 2024-04-12 ENCOUNTER — Encounter: Payer: Self-pay | Admitting: Physician Assistant

## 2024-04-12 VITALS — BP 128/100 | HR 95 | Ht 64.0 in | Wt 239.0 lb

## 2024-04-12 DIAGNOSIS — R079 Chest pain, unspecified: Secondary | ICD-10-CM | POA: Diagnosis not present

## 2024-04-12 DIAGNOSIS — K219 Gastro-esophageal reflux disease without esophagitis: Secondary | ICD-10-CM

## 2024-04-12 DIAGNOSIS — J45990 Exercise induced bronchospasm: Secondary | ICD-10-CM | POA: Diagnosis not present

## 2024-04-12 DIAGNOSIS — R0602 Shortness of breath: Secondary | ICD-10-CM

## 2024-04-12 DIAGNOSIS — E669 Obesity, unspecified: Secondary | ICD-10-CM

## 2024-04-12 DIAGNOSIS — I10 Essential (primary) hypertension: Secondary | ICD-10-CM

## 2024-04-12 MED ORDER — TOLTERODINE TARTRATE 1 MG PO TABS: 1.0000 mg | ORAL_TABLET | Freq: Four times a day (QID) | ORAL | 0 refills | Status: DC

## 2024-04-12 MED ORDER — CIPROFLOXACIN HCL 500 MG PO TABS
500.0000 mg | ORAL_TABLET | Freq: Two times a day (BID) | ORAL | 0 refills | Status: DC
  Filled 2024-04-12: qty 14, 7d supply, fill #0

## 2024-04-12 NOTE — Patient Instructions (Addendum)
 Medication Instructions:  No medication changes were made during today's visit.   Monitor your blood pressure daily or at least 4 times a week.  *If you need a refill on your cardiac medications before your next appointment, please call your pharmacy*   Lab Work: No labs were ordered during today's visit.  If you have labs (blood work) drawn today and your tests are completely normal, you will receive your results only by: MyChart Message (if you have MyChart) OR A paper copy in the mail If you have any lab test that is abnormal or we need to change your treatment, we will call you to review the results.   Testing/Procedures: Your physician has requested that you have an echocardiogram. Echocardiography is a painless test that uses sound waves to create images of your heart. It provides your doctor with information about the size and shape of your heart and how well your heart's chambers and valves are working. This procedure takes approximately one hour. There are no restrictions for this procedure. Please do NOT wear cologne, perfume, aftershave, or lotions (deodorant is allowed). Please arrive 15 minutes prior to your appointment time.  Please note: We ask at that you not bring children with you during ultrasound (echo/ vascular) testing. Due to room size and safety concerns, children are not allowed in the ultrasound rooms during exams. Our front office staff cannot provide observation of children in our lobby area while testing is being conducted. An adult accompanying a patient to their appointment will only be allowed in the ultrasound room at the discretion of the ultrasound technician under special circumstances. We apologize for any inconvenience.    Follow-Up: At [facility], you and your health needs are our priority.  As part of our continuing mission to provide you with exceptional heart care, we have created designated Provider Care Teams.  These Care Teams include  your primary Cardiologist (physician) and Advanced Practice Providers (APPs -  Physician Assistants and Nurse Practitioners) who all work together to provide you with the care you need, when you need it.  We recommend signing up for the patient portal called MyChart.  Sign up information is provided on this After Visit Summary.  MyChart is used to connect with patients for Virtual Visits (Telemedicine).  Patients are able to view lab/test results, encounter notes, upcoming appointments, etc.  Non-urgent messages can be sent to your provider as well.   To learn more about what you can do with MyChart, go to [email or web address].    Your next appointment:  OFFICE VISIT at 10:05 AM (25 min)Arrive by 9:45 AM, Wednesday June 05, 2024     Provider:   Jon Hails, PA-C          Other Instructions Thank you for choosing [facility]!     BLOOD PRESSURE READINGS TAKE YOUR BLOOD PRESSURE AT LEAST 1 HOUR AFTER TAKING YOUR MEDICATION BRING LOG WITH YOU TO YOUR FOLLOW UP APPOINTMENT FOR REVIEW   DATE TIME BP HR COMMENT DATE  TIME  BP HR COMMENT  DATE TIME BP HR COMMENT DATE  TIME  BP HR COMMENT

## 2024-04-12 NOTE — Telephone Encounter (Signed)
 Spoke with pt regarding her blood pressures. Pt stated she bought a blood pressure cuff at the pharmacy downstairs today after her appointment. Pt stated she felt some chest discomfort with walking to the pharmacy. Pt stated she went home and took her blood pressure and it was 142/104, taken on the left side. Pt stated she took it again a few minutes later on the left side and it was 120/92. Pt stated she took her blood pressure on the right side and it was 116/84. Pt stated she took her bp again 2 hours later and it was 153/105 on the left side. Two minutes later it was 130/87. Pt is wondering why there is such a difference between her readings and each arm and if she should have started the medications mentioned at her visit. Pt stated she was standing when she took the blood pressure. Pt was told to sit down, relax with both feet on the ground when she takes it and to keep a log as mentioned at her visit. Pt was told the information provided would be sent to her provider for suggestions. Pt verbalized understanding. All questions if any were answered.

## 2024-04-12 NOTE — Telephone Encounter (Signed)
 Pt is requesting a callback regarding her office visit today. She stated she has a question for the nurse regarding the medications that were supposed to be called in today. Please advise.

## 2024-04-13 ENCOUNTER — Other Ambulatory Visit (HOSPITAL_COMMUNITY): Payer: Self-pay

## 2024-04-15 ENCOUNTER — Other Ambulatory Visit: Payer: Self-pay

## 2024-04-15 NOTE — Telephone Encounter (Signed)
 I think with ongoing HTN, we should start 2.5 mg amlodipine at night.

## 2024-04-15 NOTE — Telephone Encounter (Signed)
 Spoke with pt. Pt is aware that Jon Hails PA would like her to start Amlodipine 2.5 mg nightly. Pt wouldn't like this medication called in until next week since she is currently out of town. Pt was upset and stated that she was expecting a call back with this medication on Friday. Pt will be contacted prior to medication being sent out next week.

## 2024-04-16 ENCOUNTER — Other Ambulatory Visit (HOSPITAL_COMMUNITY): Payer: Self-pay

## 2024-04-17 ENCOUNTER — Other Ambulatory Visit (HOSPITAL_COMMUNITY): Payer: Self-pay

## 2024-04-18 ENCOUNTER — Other Ambulatory Visit (HOSPITAL_COMMUNITY): Payer: Self-pay

## 2024-04-18 ENCOUNTER — Other Ambulatory Visit (HOSPITAL_BASED_OUTPATIENT_CLINIC_OR_DEPARTMENT_OTHER): Payer: Self-pay

## 2024-04-20 ENCOUNTER — Other Ambulatory Visit (HOSPITAL_COMMUNITY): Payer: Self-pay

## 2024-04-22 ENCOUNTER — Other Ambulatory Visit (HOSPITAL_COMMUNITY): Payer: Self-pay

## 2024-04-22 ENCOUNTER — Other Ambulatory Visit (HOSPITAL_BASED_OUTPATIENT_CLINIC_OR_DEPARTMENT_OTHER): Payer: Self-pay

## 2024-04-22 MED ORDER — AMLODIPINE BESYLATE 2.5 MG PO TABS
2.5000 mg | ORAL_TABLET | Freq: Every day | ORAL | 3 refills | Status: DC
  Filled 2024-04-22: qty 90, 90d supply, fill #0

## 2024-04-22 NOTE — Telephone Encounter (Signed)
 Spoke with pt. Amlodipine  2.5 mg nightly was sent to pts pharmacy.

## 2024-04-22 NOTE — Telephone Encounter (Signed)
 Lmom to see if it was ok with Amlodipine  2.5 mg being sent to her pharmacy. When I spoke with pt on 04/16/24, per pt her medications get delivered and she didn't want the medications sitting outside until pt got home.

## 2024-04-23 ENCOUNTER — Other Ambulatory Visit: Payer: Self-pay

## 2024-04-23 ENCOUNTER — Other Ambulatory Visit (HOSPITAL_COMMUNITY): Payer: Self-pay

## 2024-05-06 ENCOUNTER — Other Ambulatory Visit (HOSPITAL_COMMUNITY): Payer: Self-pay

## 2024-05-06 ENCOUNTER — Other Ambulatory Visit: Payer: Self-pay

## 2024-05-07 ENCOUNTER — Other Ambulatory Visit: Payer: Self-pay

## 2024-05-16 ENCOUNTER — Ambulatory Visit: Payer: Self-pay | Admitting: Physician Assistant

## 2024-05-16 ENCOUNTER — Ambulatory Visit (HOSPITAL_COMMUNITY)
Admission: RE | Admit: 2024-05-16 | Discharge: 2024-05-16 | Disposition: A | Discharge status: Home / Self Care | Source: Ambulatory Visit | Attending: Cardiology | Admitting: Cardiology

## 2024-05-16 DIAGNOSIS — E669 Obesity, unspecified: Secondary | ICD-10-CM | POA: Insufficient documentation

## 2024-05-16 DIAGNOSIS — R0602 Shortness of breath: Secondary | ICD-10-CM | POA: Insufficient documentation

## 2024-05-16 DIAGNOSIS — R079 Chest pain, unspecified: Secondary | ICD-10-CM | POA: Diagnosis present

## 2024-05-16 DIAGNOSIS — J45990 Exercise induced bronchospasm: Secondary | ICD-10-CM | POA: Insufficient documentation

## 2024-05-16 DIAGNOSIS — I10 Essential (primary) hypertension: Secondary | ICD-10-CM | POA: Diagnosis present

## 2024-05-16 DIAGNOSIS — K219 Gastro-esophageal reflux disease without esophagitis: Secondary | ICD-10-CM | POA: Insufficient documentation

## 2024-05-16 LAB — ECHOCARDIOGRAM COMPLETE
Area-P 1/2: 4.36 cm2
S' Lateral: 2.7 cm

## 2024-05-16 MED ORDER — PERFLUTREN LIPID MICROSPHERE
1.0000 mL | INTRAVENOUS | Status: AC | PRN
  Administered 2024-05-16: 2 mL via INTRAVENOUS

## 2024-05-20 ENCOUNTER — Other Ambulatory Visit (HOSPITAL_COMMUNITY): Payer: Self-pay

## 2024-05-22 ENCOUNTER — Other Ambulatory Visit: Payer: Self-pay

## 2024-05-22 ENCOUNTER — Encounter: Payer: Self-pay | Admitting: Pharmacist

## 2024-05-23 ENCOUNTER — Ambulatory Visit

## 2024-05-23 ENCOUNTER — Other Ambulatory Visit (HOSPITAL_COMMUNITY): Payer: Self-pay

## 2024-05-23 ENCOUNTER — Encounter: Payer: Self-pay | Admitting: Pulmonary Disease

## 2024-05-23 ENCOUNTER — Ambulatory Visit: Admitting: Pulmonary Disease

## 2024-05-23 VITALS — BP 110/79 | HR 90 | Temp 97.9°F | Ht 64.0 in | Wt 243.0 lb

## 2024-05-23 DIAGNOSIS — R0609 Other forms of dyspnea: Secondary | ICD-10-CM | POA: Diagnosis not present

## 2024-05-23 DIAGNOSIS — J301 Allergic rhinitis due to pollen: Secondary | ICD-10-CM

## 2024-05-23 DIAGNOSIS — K219 Gastro-esophageal reflux disease without esophagitis: Secondary | ICD-10-CM | POA: Diagnosis not present

## 2024-05-23 MED ORDER — ALBUTEROL SULFATE HFA 108 (90 BASE) MCG/ACT IN AERS
2.0000 | INHALATION_SPRAY | Freq: Four times a day (QID) | RESPIRATORY_TRACT | 6 refills | Status: AC | PRN
  Filled 2024-05-23: qty 6.7, 20d supply, fill #0
  Filled 2024-05-23: qty 6.7, 17d supply, fill #0

## 2024-05-23 MED ORDER — BUDESONIDE-FORMOTEROL FUMARATE 160-4.5 MCG/ACT IN AERO
2.0000 | INHALATION_SPRAY | Freq: Two times a day (BID) | RESPIRATORY_TRACT | 12 refills | Status: DC
  Filled 2024-05-23 (×2): qty 10.2, 30d supply, fill #0

## 2024-05-23 NOTE — Progress Notes (Signed)
 @Patient  ID: Christy Alexander, female    DOB: 12-30-1980, 43 y.o.   MRN: [id number]  Chief Complaint  Patient presents with   Consult    Chest discomfort     Referring provider: Madie Jon Garre, PA  HPI:   43 y.o. woman whom were seeing for evaluation of chest discomfort, exercise intolerance.  Multiple prior cardiology notes reviewed.  Patient notes difficulty with exercise, exercise intolerance for many years.  Even as young as in school or younger adult.  Unable to keep up with others exercising.  Short of breath.  Weight is fluctuated but even with lower weights this has been an issue.  She is noting similar issues recently.  But in addition to the shortness of breath has been associate with chest discomfort.  Heaviness or tightness.  Gets better with rest.  Has not used inhalers.  No time of day when things are better or worse.  No seasonal or environmental factors she can identify the main thing is better or worse.  She endorses GERD and reflux.  She reports an upper airway high-pitched sound when she forces exhalation.  She notes she had had elevated inflammatory markers in the past.  Seeing rheumatology on multiple occasions.  Without cause despite significant serologic testing.  Echocardiogram 05/2022 reviewed and without significant findings.  CT coronary 05/2022 without significant calcification.  Echocardiogram 04/2024 revealed trivial MVR, otherwise no significant findings.    Questionaires / Pulmonary Flowsheets:   ACT:      No data to display          MMRC:     No data to display          Epworth:      No data to display          Tests:   FENO:  No results found for: NITRICOXIDE  PFT:     No data to display          WALK:      No data to display          Imaging: Personally reviewed and as per EMR and discussion as noted ECHOCARDIOGRAM COMPLETE Result Date: 05/16/2024    ECHOCARDIOGRAM REPORT   Patient Name:   Christy Alexander  Date  of Exam: 05/16/2024 Medical Rec #:  [id number]     Height:       64.0 in Accession #:    [id number]    Weight:       239.0 lb Date of Birth:  1981-03-29     BSA:          2.110 m Patient Age:    42 years      BP:           128/100 mmHg Patient Gender: F             HR:           80 bpm. Exam Location:  [address] Procedure: 2D Echo, Cardiac Doppler, Color Doppler and Intracardiac            Opacification Agent (Both Spectral and Color Flow Doppler were            utilized during procedure). Indications:    R07.9 Chest pain  History:        Patient has prior history of Echocardiogram examinations, most                 recent 05/27/2022. Signs/Symptoms:Chest Pain.  Sonographer:    NaTashia Rodgers-Jones  RDCS Referring Phys: [id number] ANGELA NICOLE DUKE IMPRESSIONS  1. Left ventricular ejection fraction, by estimation, is 55 to 60%. The left ventricle has normal function. The left ventricle has no regional wall motion abnormalities. Left ventricular diastolic parameters were normal.  2. Right ventricular systolic function is normal. The right ventricular size is normal. Tricuspid regurgitation signal is inadequate for assessing PA pressure.  3. The mitral valve is normal in structure. Trivial mitral valve regurgitation. No evidence of mitral stenosis.  4. The aortic valve is tricuspid. Aortic valve regurgitation is not visualized. Aortic valve sclerosis/calcification is present, without any evidence of aortic stenosis.  5. The inferior vena cava is normal in size with greater than 50% respiratory variability, suggesting right atrial pressure of 3 mmHg. FINDINGS  Left Ventricle: Left ventricular ejection fraction, by estimation, is 55 to 60%. The left ventricle has normal function. The left ventricle has no regional wall motion abnormalities. Definity  contrast agent was given IV to delineate the left ventricular  endocardial borders. The left ventricular internal cavity size was normal in size. There is no left ventricular  hypertrophy. Left ventricular diastolic parameters were normal. Normal left ventricular filling pressure. Right Ventricle: The right ventricular size is normal. No increase in right ventricular wall thickness. Right ventricular systolic function is normal. Tricuspid regurgitation signal is inadequate for assessing PA pressure. Left Atrium: Left atrial size was normal in size. Right Atrium: Right atrial size was normal in size. Pericardium: There is no evidence of pericardial effusion. Mitral Valve: The mitral valve is normal in structure. Trivial mitral valve regurgitation. No evidence of mitral valve stenosis. Tricuspid Valve: The tricuspid valve is normal in structure. Tricuspid valve regurgitation is trivial. No evidence of tricuspid stenosis. Aortic Valve: The aortic valve is tricuspid. Aortic valve regurgitation is not visualized. Aortic valve sclerosis/calcification is present, without any evidence of aortic stenosis. Pulmonic Valve: The pulmonic valve was normal in structure. Pulmonic valve regurgitation is not visualized. No evidence of pulmonic stenosis. Aorta: The aortic root is normal in size and structure. Venous: The inferior vena cava is normal in size with greater than 50% respiratory variability, suggesting right atrial pressure of 3 mmHg. IAS/Shunts: No atrial level shunt detected by color flow Doppler.  LEFT VENTRICLE PLAX 2D LVIDd:         4.60 cm   Diastology LVIDs:         2.70 cm   LV e' medial:    9.78 cm/s LV PW:         0.80 cm   LV E/e' medial:  10.3 LV IVS:        0.90 cm   LV e' lateral:   14.97 cm/s LVOT diam:     1.90 cm   LV E/e' lateral: 6.7 LV SV:         56 LV SV Index:   27 LVOT Area:     2.84 cm  RIGHT VENTRICLE             IVC RV Basal diam:  3.60 cm     IVC diam: 0.90 cm RV S prime:     11.20 cm/s TAPSE (M-mode): 1.7 cm LEFT ATRIUM             Index        RIGHT ATRIUM          Index LA diam:        4.30 cm 2.04 cm/m   RA Area:     8.65 cm LA Vol (  A2C):   24.7 ml 11.70 ml/m   RA Volume:   19.00 ml 9.00 ml/m LA Vol (A4C):   41.8 ml 19.81 ml/m LA Biplane Vol: 33.7 ml 15.97 ml/m  AORTIC VALVE LVOT Vmax:   97.90 cm/s LVOT Vmean:  65.000 cm/s LVOT VTI:    0.198 m  AORTA Ao Root diam: 3.10 cm Ao Asc diam:  2.90 cm MITRAL VALVE MV Area (PHT): 4.36 cm     SHUNTS MV Decel Time: 174 msec     Systemic VTI:  0.20 m MV E velocity: 101.00 cm/s  Systemic Diam: 1.90 cm MV A velocity: 68.10 cm/s MV E/A ratio:  1.48 Wilbert Bihari MD Electronically signed by Wilbert Bihari MD Signature Date/Time: 05/16/2024/8:52:40 AM    Final     Lab Results: Personally reviewed CBC    Component Value Date/Time   WBC 5.5 10/15/2020 0934   RBC 4.79 10/15/2020 0934   HGB 14.0 10/15/2020 0934   HGB 12.9 06/04/2019 0909   HCT 42.0 10/15/2020 0934   HCT 39.4 06/04/2019 0909   PLT 307 10/15/2020 0934   MCV 87.7 10/15/2020 0934   MCV 88 06/04/2019 0909   MCH 29.2 10/15/2020 0934   MCHC 33.3 10/15/2020 0934   RDW 12.9 10/15/2020 0934   RDW 13.5 06/04/2019 0909   LYMPHSABS 1,689 10/15/2020 0934   LYMPHSABS 1.8 06/04/2019 0909   MONOABS 0.9 12/27/2019 1811   EOSABS 77 10/15/2020 0934   EOSABS 0.1 06/04/2019 0909   BASOSABS 39 10/15/2020 0934   BASOSABS 0.1 06/04/2019 0909    BMET    Component Value Date/Time   NA 134 (L) 07/13/2023 1813   NA 140 06/04/2019 0909   K 3.7 07/13/2023 1813   CL 102 07/13/2023 1813   CO2 22 07/13/2023 1813   GLUCOSE 100 (H) 07/13/2023 1813   BUN 14 07/13/2023 1813   BUN 10 06/04/2019 0909   CREATININE 0.65 07/13/2023 1813   CREATININE 0.69 10/15/2020 0934   CALCIUM  8.7 (L) 07/13/2023 1813   GFRNONAA >60 07/13/2023 1813   GFRNONAA 110 10/15/2020 0934   GFRAA 128 10/15/2020 0934    BNP No results found for: BNP  ProBNP No results found for: PROBNP  Specialty Problems       Pulmonary Problems   Allergic rhinitis due to pollen   Laryngopharyngeal reflux (LPR)   Throat discomfort    Allergies  Allergen Reactions   Aspirin Swelling and Other  (See Comments)    hoarseness   Chlorhexidine  Dermatitis and Rash   Esomeprazole Hives   Nsaids Other (See Comments)    Eyes swelling/loss of voice   Chlorhexidine  Gluconate Other (See Comments) and Rash   Esomeprazole Magnesium Rash   Iodinated Contrast Media Hives    Immunization History  Administered Date(s) Administered   Influenza,inj,quad, With Preservative 07/10/2018, 07/01/2019   Influenza-Unspecified 06/24/2015, 06/23/2016, 06/26/2017, 07/09/2018, 07/01/2019, 06/30/2020   PFIZER(Purple Top)SARS-COV-2 Vaccination 11/06/2019, 11/28/2019, 09/08/2020   Pfizer Covid-19 Vaccine Bivalent Booster 49yrs & up 09/09/2021   Pfizer(Comirnaty)Fall Seasonal Vaccine 12 years and older 07/01/2022, 07/03/2023   Td 02/04/2015   Tdap 02/04/2015   Tetanus 02/04/2015    Past Medical History:  Diagnosis Date   Anxiety    Back pain at L4-L5 level    BMI 30.0-30.9,adult    Chest pain    Constipation    Delayed pressure urticaria dx age 84   trauma to skin causes deep tissue  swelling   GERD (gastroesophageal reflux disease)    Hyperglycemia  Hyperlipidemia    Laryngopharyngeal reflux    Lower back pain    l4 to l5   Obesity    Right thyroid  nodule    Seasonal allergies    TMJ (dislocation of temporomandibular joint)    Urticaria    Vitamin D  deficiency     Tobacco History: Social History   Tobacco Use  Smoking Status Never   Passive exposure: Never  Smokeless Tobacco Never   Counseling given: Not Answered   Continue to not smoke  Outpatient Encounter Medications as of 05/23/2024  Medication Sig   albuterol  (VENTOLIN  HFA) 108 (90 Base) MCG/ACT inhaler Inhale 2 puffs into the lungs every 6 (six) hours as needed for wheezing or shortness of breath.   Ascorbic Acid (VITAMIN C) 1000 MG tablet Take 1,000 mg by mouth at bedtime.   budesonide -formoterol  (SYMBICORT ) 160-4.5 MCG/ACT inhaler Inhale 2 puffs into the lungs 2 (two) times daily.   cetirizine (ZYRTEC) 10 MG tablet Take  10 mg by mouth at bedtime.    diphenhydrAMINE  (BENADRYL ) 2 % cream Apply 1 application topically 3 (three) times daily as needed (for hives.).   diphenhydrAMINE  (BENADRYL ) 25 mg capsule Take 25-50 mg by mouth every 6 (six) hours as needed (for hives.).    meloxicam  (MOBIC ) 7.5 MG tablet Take 1 tablet (7.5 mg) by mouth daily. (Patient taking differently: Take 7.5 mg by mouth as needed.)   metFORMIN  (GLUCOPHAGE -XR) 500 MG 24 hr tablet TAKE 1 TABLET BY MOUTH ONCE DAILY   Norethin  Ace-Eth Estrad-FE (MINASTRIN 24 FE) 1-20 MG-MCG(24) CHEW Chew 1 tablet by mouth daily.   norethindrone -ethinyl estradiol -FE (JUNEL  FE 1/20) 1-20 MG-MCG tablet Take 1 tablet by mouth daily.   nystatin  (MYCOSTATIN /NYSTOP ) powder APPLY TO THE AFFECTED AREA(S) 2 TIMES PER DAY AS DIRECTED.   omeprazole  (PRILOSEC) 40 MG capsule Take 1 capsule (40 mg total) by mouth 2 (two) times daily.   VITAMIN D  PO Take 2,000 Units by mouth daily.   amLODipine  (NORVASC ) 2.5 MG tablet Take 1 tablet (2.5 mg total) by mouth daily. (Patient not taking: Reported on 05/23/2024)   fluticasone  (FLONASE ) 50 MCG/ACT nasal spray Place 2 sprays into both nostrils daily. (Patient not taking: Reported on 05/23/2024)   [DISCONTINUED] ciprofloxacin  (CIPRO ) 500 MG tablet Take 1 tablet (500 mg total) by mouth every 12 (twelve) hours for 7 days.   [DISCONTINUED] fluconazole  (DIFLUCAN ) 150 MG tablet Take 1 tablet (150 mg total) by mouth every 72 hours.   No facility-administered encounter medications on file as of 05/23/2024.     Review of Systems  Review of Systems  No chest pain with exertion.  No orthopnea or PND.  Comprehensive review of system otherwise negative. Physical Exam  BP 110/79   Pulse 90   Temp 97.9 F (36.6 C) (Oral)   Ht 5' 4 (1.626 m)   Wt 243 lb (110.2 kg)   SpO2 95%   BMI 41.71 kg/m   Wt Readings from Last 5 Encounters:  05/23/24 243 lb (110.2 kg)  04/12/24 239 lb (108.4 kg)  07/13/23 233 lb (105.7 kg)  06/27/23 237 lb 6.4 oz  (107.7 kg)  04/25/23 240 lb (108.9 kg)    BMI Readings from Last 5 Encounters:  05/23/24 41.71 kg/m  04/12/24 41.02 kg/m  07/13/23 39.99 kg/m  06/27/23 40.75 kg/m  04/25/23 41.20 kg/m     Physical Exam General: Sitting in chair, no acute distress Eyes: EOMI, no icterus Neck: Supple, no JVP Pulmonary: Clear, good air excursion, normal breathing Cardiovascular: Regular rate  and rhythm, no murmur Abdomen: Nondistended MSK: No synovitis, no joint effusion Neuro: Normal gait, no weakness Psych: Normal mood, full affect   Assessment & Plan:   Chest discomfort, dyspnea on exertion: Dyspnea preceded chest discomfort for many years.  Chest tightness, discomfort with exertion over the last couple of years.  Cardiology evaluation completed and reassuring.  Exercise-induced asthma quite possible.  Pulmonary function test, chest x-ray for further evaluation.  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  Morning rattling that clears with cough concerning for underlying inflammation related to asthma.  Symbicort  2 puffs twice a day, albuterol  as needed and prior to exercise recommended.  Assess response.  Allergies: Referred to allergist today.   Return in about 3 months (around 08/22/2024) for f/u Dr. Annella, after PFT.   Donnice JONELLE Annella, MD 05/23/2024   This appointment required 61 minutes of patient care (this includes precharting, chart review, review of results, face-to-face care, etc.).

## 2024-05-23 NOTE — Patient Instructions (Signed)
 It is nice to meet you  I think something like exercise-induced asthma or asthma in general is quite possible.  This could explain your chest discomfort or tightness with exertion, the rattling in your chest in the morning that clears with coughing, and possibly the shortness of breath with exercise in the past.  For further evaluation I recommend a chest x-ray today just to make sure everything is clear  For further evaluation I recommend pulmonary function test to see if we can see signs or patterns that could be consistent with asthma  To help you feel better recommend using Symbicort  2 puffs in the morning and 2 puffs in the evening, every day.  Rinse your mouth out with water after every use.  Also, use albuterol  as needed 2 puffs every 4-6 hours for the chest discomfort, also recommend using it in anticipation of exercise, 15 minutes or so before exercising.  If the inhalers are too expensive please see me a message and we can look for more cost-effective solution  Return to clinic in 3 months or sooner as needed with Dr. Annella

## 2024-05-29 ENCOUNTER — Telehealth: Payer: Self-pay

## 2024-05-29 NOTE — Telephone Encounter (Signed)
 I reviewed chest x-ray, looks clear to my eyes.  I do see a little bit of extra air in the lungs which sometimes we can see with asthma.  Radiologist has not read formally but once they have I will send a note on the result.

## 2024-05-29 NOTE — Telephone Encounter (Signed)
 Copied from CRM [id number]. Topic: Clinical - Lab/Test Results >> May 29, 2024  8:39 AM Leila C wrote: Reason for CRM: Patient is looking for imaging results: chest xray. Informed patient, across [facility] due to shortage of radiologists results can take 3-4 weeks. Patient verbalized understanding, will wait and denies any other concerns. Patient would like a results via Mychart, unless it's cancer call back [phone].  Please advise x-ray results once available. Thanks Dr. Annella

## 2024-05-30 NOTE — Telephone Encounter (Signed)
 Pt is aware of results and voiced her understanding. Nothing further needed.

## 2024-05-31 ENCOUNTER — Ambulatory Visit: Payer: Self-pay | Admitting: Pulmonary Disease

## 2024-06-04 ENCOUNTER — Encounter: Payer: Self-pay | Admitting: Gastroenterology

## 2024-06-05 ENCOUNTER — Ambulatory Visit: Admitting: Physician Assistant

## 2024-06-13 ENCOUNTER — Ambulatory Visit
Admission: EM | Admit: 2024-06-13 | Discharge: 2024-06-13 | Disposition: A | Discharge status: Home / Self Care | Attending: Family Medicine | Admitting: Family Medicine

## 2024-06-13 DIAGNOSIS — B9789 Other viral agents as the cause of diseases classified elsewhere: Secondary | ICD-10-CM

## 2024-06-13 DIAGNOSIS — J329 Chronic sinusitis, unspecified: Secondary | ICD-10-CM | POA: Diagnosis not present

## 2024-06-13 MED ORDER — DEXAMETHASONE SODIUM PHOSPHATE 10 MG/ML IJ SOLN
10.0000 mg | Freq: Once | INTRAMUSCULAR | Status: AC
  Administered 2024-06-13: 10 mg via INTRAMUSCULAR

## 2024-06-13 MED ORDER — AZELASTINE HCL 0.1 % NA SOLN
1.0000 | Freq: Two times a day (BID) | NASAL | 0 refills | Status: DC
Start: 2024-06-13 — End: 2024-08-13

## 2024-06-13 NOTE — ED Provider Notes (Signed)
 [facility]    CSN: [id number] Arrival date & time: 06/13/24  9040      History   Chief Complaint No chief complaint on file.   HPI Christy Alexander is a 43 y.o. female.   Patient presenting today with 3 to 4-day history of sinus pain and pressure, nasal congestion, sneezing.  Denies fever, chills, chest pain, shortness of breath, abdominal pain, vomiting, diarrhea.  So far trying Sudafed and Zyrtec which she states the Sudafed does help temporarily but symptoms returned once they wear off.  She is requesting antibiotics and steroids for sinus infection.    Past Medical History:  Diagnosis Date   Anxiety    Back pain at L4-L5 level    BMI 30.0-30.9,adult    Chest pain    Constipation    Delayed pressure urticaria dx age 73   trauma to skin causes deep tissue  swelling   GERD (gastroesophageal reflux disease)    Hyperglycemia    Hyperlipidemia    Laryngopharyngeal reflux    Lower back pain    l4 to l5   Obesity    Right thyroid  nodule    Seasonal allergies    TMJ (dislocation of temporomandibular joint)    Urticaria    Vitamin D  deficiency     Patient Active Problem List   Diagnosis Date Noted   Microhematuria 07/21/2020   Urge incontinence 07/21/2020   Referred otalgia of both ears 10/31/2019   Throat discomfort 10/31/2019   Double vision 06/04/2019   Paresthesia 06/04/2019   Weakness 06/04/2019   Hyperglycemia 03/22/2019   Dysphonia 07/03/2018   H/O partial thyroidectomy 07/03/2018   Vitamin D  deficiency 06/26/2018   Right thyroid  nodule 01/27/2018   Multiple joint pain 05/29/2017   Hyperlipidemia 11/21/2016   Elevated C-reactive protein 06/30/2016   Laryngopharyngeal reflux (LPR) 05/10/2016   Obesity with body mass index 30 or greater 05/10/2016   AP (abdominal pain) 03/18/2016   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   Chronic constipation 03/08/2016   Fissure in skin 03/08/2016   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   Drug allergy 01/26/2016    Idiopathic urticaria 01/26/2016   Allergic rhinitis due to pollen 01/26/2016   Arthritis 01/26/2016   Anxiety 11/10/2015   Lumbosacral disc disease 02/27/2015    Past Surgical History:  Procedure Laterality Date   LUMBAR LAMINECTOMY  2006   L4 and L5 discectomy micro, hemilaminectomy, had conscious sedation for    THYROID  LOBECTOMY Right 02/01/2018   Procedure: RIGHT THYROID  LOBECTOMY;  Surgeon: Eletha Boas, MD;  Location: [facility];  Service: General;  Laterality: Right;   WISDOM TOOTH EXTRACTION  2001    OB History   No obstetric history on file.      Home Medications    Prior to Admission medications   Medication Sig Start Date End Date Taking? Authorizing Provider  azelastine  (ASTELIN ) 0.1 % nasal spray Place 1 spray into both nostrils 2 (two) times daily. Use in each nostril as directed 06/13/24  Yes Stuart Vernell Norris, PA-C  albuterol  (VENTOLIN  HFA) 108 (90 Base) MCG/ACT inhaler Inhale 2 puffs into the lungs every 6 (six) hours as needed for wheezing or shortness of breath. 05/23/24   Hunsucker, Donnice SAUNDERS, MD  amLODipine  (NORVASC ) 2.5 MG tablet Take 1 tablet (2.5 mg total) by mouth daily. Patient not taking: Reported on 05/23/2024 04/22/24 07/21/24  Madie Jon Garre, PA  Ascorbic Acid (VITAMIN C) 1000 MG tablet Take 1,000 mg by mouth at bedtime.    [provider]  budesonide -formoterol  (SYMBICORT ) 160-4.5 MCG/ACT inhaler Inhale 2 puffs into the lungs 2 (two) times daily. 05/23/24   Hunsucker, Donnice SAUNDERS, MD  cetirizine (ZYRTEC) 10 MG tablet Take 10 mg by mouth at bedtime.     [provider]  diphenhydrAMINE  (BENADRYL ) 2 % cream Apply 1 application topically 3 (three) times daily as needed (for hives.).    [provider]  diphenhydrAMINE  (BENADRYL ) 25 mg capsule Take 25-50 mg by mouth every 6 (six) hours as needed (for hives.).     [provider]  fluticasone  (FLONASE ) 50 MCG/ACT nasal spray Place 2 sprays into both nostrils daily. Patient not  taking: Reported on 05/23/2024 11/21/23   Burnette, Jennifer M, PA-C  meloxicam  (MOBIC ) 7.5 MG tablet Take 1 tablet (7.5 mg) by mouth daily. Patient taking differently: Take 7.5 mg by mouth as needed. 04/10/23     metFORMIN  (GLUCOPHAGE -XR) 500 MG 24 hr tablet TAKE 1 TABLET BY MOUTH ONCE DAILY 10/23/23     Norethin  Ace-Eth Estrad-FE (MINASTRIN 24 FE) 1-20 MG-MCG(24) CHEW Chew 1 tablet by mouth daily. 10/23/23     norethindrone -ethinyl estradiol -FE (JUNEL  FE 1/20) 1-20 MG-MCG tablet Take 1 tablet by mouth daily. 02/28/24     nystatin  (MYCOSTATIN /NYSTOP ) powder APPLY TO THE AFFECTED AREA(S) 2 TIMES PER DAY AS DIRECTED. 07/18/23     omeprazole  (PRILOSEC) 40 MG capsule Take 1 capsule (40 mg total) by mouth 2 (two) times daily. 12/18/23     VITAMIN D  PO Take 2,000 Units by mouth daily.    [provider]    Family History Family History  Problem Relation Age of Onset   Diabetes type II Mother    Heart disease Mother        CHF/CAD   Sleep apnea Mother    Kidney failure Mother    Tongue cancer Father    Glaucoma Father    Heart disease Father        CAD   Diabetes type II Brother    Cataracts Brother    Hypertension Brother    Anxiety disorder Brother    Depression Brother    Heart attack Maternal Grandmother    Heart disease Maternal Grandmother    Other Maternal Grandfather        worked in Haematologist mine   Colon cancer Paternal Grandmother    Diabetes Paternal Grandfather    Heart disease Paternal Grandfather    Allergic rhinitis Neg Hx    Angioedema Neg Hx    Asthma Neg Hx    Eczema Neg Hx    Immunodeficiency Neg Hx    Urticaria Neg Hx     Social History Social History   Tobacco Use   Smoking status: Never    Passive exposure: Never   Smokeless tobacco: Never  Vaping Use   Vaping status: Never Used  Substance Use Topics   Alcohol use: No   Drug use: No     Allergies   Tolmetin, Aspirin, Chlorhexidine , Esomeprazole, Nsaids, Chlorhexidine  gluconate, Esomeprazole  magnesium, and Iodinated contrast media   Review of Systems Review of Systems PER HPI  Physical Exam Triage Vital Signs ED Triage Vitals  Encounter Vitals Group     BP 06/13/24 1136 126/80     Girls Systolic BP Percentile --      Girls Diastolic BP Percentile --      Boys Systolic BP Percentile --      Boys Diastolic BP Percentile --      Pulse Rate 06/13/24 1136 97  Resp 06/13/24 1136 20     Temp 06/13/24 1136 98.3 F (36.8 C)     Temp Source 06/13/24 1136 Oral     SpO2 06/13/24 1136 97 %     Weight --      Height --      Head Circumference --      Peak Flow --      Pain Score 06/13/24 1139 4     Pain Loc --      Pain Education --      Exclude from Growth Chart --    No data found.  Updated Vital Signs BP 126/80 (BP Location: Right Arm)   Pulse 97   Temp 98.3 F (36.8 C) (Oral)   Resp 20   SpO2 97%   Visual Acuity Right Eye Distance:   Left Eye Distance:   Bilateral Distance:    Right Eye Near:   Left Eye Near:    Bilateral Near:     Physical Exam Vitals and nursing note reviewed.  Constitutional:      Appearance: Normal appearance.  HENT:     Head: Atraumatic.     Right Ear: Tympanic membrane and external ear normal.     Left Ear: Tympanic membrane and external ear normal.     Nose: Congestion present.     Mouth/Throat:     Mouth: Mucous membranes are moist.     Pharynx: No oropharyngeal exudate or posterior oropharyngeal erythema.  Eyes:     Extraocular Movements: Extraocular movements intact.     Conjunctiva/sclera: Conjunctivae normal.  Cardiovascular:     Rate and Rhythm: Normal rate and regular rhythm.     Heart sounds: Normal heart sounds.  Pulmonary:     Effort: Pulmonary effort is normal.     Breath sounds: Normal breath sounds. No wheezing or rales.  Musculoskeletal:        General: Normal range of motion.     Cervical back: Normal range of motion and neck supple.  Skin:    General: Skin is warm and dry.  Neurological:      Mental Status: She is alert and oriented to person, place, and time.  Psychiatric:        Mood and Affect: Mood normal.        Thought Content: Thought content normal.      UC Treatments / Results  Labs (all labs ordered are listed, but only abnormal results are displayed) Labs Reviewed - No data to display  EKG   Radiology No results found.  Procedures Procedures (including critical care time)  Medications Ordered in UC Medications  dexamethasone  (DECADRON ) injection 10 mg (10 mg Intramuscular Given 06/13/24 1200)    Initial Impression / Assessment and Plan / UC Course  I have reviewed the triage vital signs and the nursing notes.  Pertinent labs & imaging results that were available during my care of the patient were reviewed by me and considered in my medical decision making (see chart for details).     Vital signs and exam very reassuring, consistent with viral sinusitis.  Discussed expectations with course of symptoms, continued Sudafed, and starting saline sinus rinses, nasal spray such as Astelin  and/or Flonase , and will give IM Decadron  to help additionally.  Return for worsening symptoms.  Final Clinical Impressions(s) / UC Diagnoses   Final diagnoses:  Viral sinusitis     Discharge Instructions      In addition to decongestants, the steroid shot given today in clinic and  your typical daily Zyrtec, I have prescribed Astelin  nasal spray to help with sinus pressure and ear pressure, drainage.  You may also use saline sinus rinses several times daily as needed, humidifiers and over-the-counter pain relievers.    ED Prescriptions     Medication Sig Dispense Auth. Provider   azelastine  (ASTELIN ) 0.1 % nasal spray Place 1 spray into both nostrils 2 (two) times daily. Use in each nostril as directed 30 mL Stuart Vernell Norris, PA-C      PDMP not reviewed this encounter.   Stuart Vernell Norris, [place] 06/13/24 1215

## 2024-06-13 NOTE — ED Triage Notes (Signed)
 Pt reports facial pain and sinus pressure, congestion, green and yellow colored mucus. Pain around the temples. Sx's started Monday.

## 2024-06-13 NOTE — Discharge Instructions (Signed)
 In addition to decongestants, the steroid shot given today in clinic and your typical daily Zyrtec, I have prescribed Astelin  nasal spray to help with sinus pressure and ear pressure, drainage.  You may also use saline sinus rinses several times daily as needed, humidifiers and over-the-counter pain relievers.

## 2024-06-19 ENCOUNTER — Other Ambulatory Visit: Payer: Self-pay

## 2024-06-19 ENCOUNTER — Other Ambulatory Visit (HOSPITAL_COMMUNITY): Payer: Self-pay

## 2024-06-19 MED ORDER — OMEPRAZOLE 40 MG PO CPDR
40.0000 mg | DELAYED_RELEASE_CAPSULE | Freq: Two times a day (BID) | ORAL | 0 refills | Status: DC
  Filled 2024-06-19: qty 60, 30d supply, fill #0

## 2024-06-19 NOTE — Progress Notes (Addendum)
 Cardiology Office Note:    Date:  06/24/2024   ID:  Christy Alexander, DOB December 30, 1980, MRN [id number]  PCP:  Jesus Bernardino MATSU, MD   [facility] Providers Cardiologist:  Lonni Cash, MD Cardiology APP:  Madie Jon Garre, PA     Referring MD: Gladystine Erminio CROME, MD   Chief Complaint  Patient presents with   Follow-up    Chest pain    History of Present Illness:    Christy Alexander is a 43 y.o. female with a hx of anxiety, GERD, and hyperlipidemia.  She has been followed for chest pain evaluation in 2023.  Coronary CTA 05/31/2022 showed a coronary calcium  score of 0 and no evidence of CAD.  Follow-up echocardiogram 05/2022 with preserved LVEF 60-65% and no valvular disease.  She has a history of persistently high CRP levels for nearly 10 years with no clear explanation despite multiple referrals to rheumatology.  She questions exercise-induced asthma, has not followed with pulmonology.  I saw her on 04/12/2024 for routine follow-up and also chest pain.  She also experienced progressively increasing BP readings at home.  She also reported burping and chest burning sensation during walks that she felt was likely related to GERD.  I opted to repeat an echocardiogram which revealed preserved BiV function and no significant valvular disease.  I started 2.5 mg amlodipine  at night.  She presents for cardiology follow-up. She continues to have chest pain with walking relieved with tylenol  and rest. CP also occurs with rest: riding in a car, sitting at a desk. No definite pattern.    Past Medical History:  Diagnosis Date   Anxiety    Back pain at L4-L5 level    BMI 30.0-30.9,adult    Chest pain    Constipation    Delayed pressure urticaria dx age 54   trauma to skin causes deep tissue  swelling   GERD (gastroesophageal reflux disease)    Hyperglycemia    Hyperlipidemia    Laryngopharyngeal reflux    Lower back pain    l4 to l5   Obesity    Right thyroid  nodule     Seasonal allergies    TMJ (dislocation of temporomandibular joint)    Urticaria    Vitamin D  deficiency     Past Surgical History:  Procedure Laterality Date   LUMBAR LAMINECTOMY  2006   L4 and L5 discectomy micro, hemilaminectomy, had conscious sedation for    THYROID  LOBECTOMY Right 02/01/2018   Procedure: RIGHT THYROID  LOBECTOMY;  Surgeon: Eletha Boas, MD;  Location: [facility];  Service: General;  Laterality: Right;   WISDOM TOOTH EXTRACTION  2001    Current Medications: Current Meds  Medication Sig   Ascorbic Acid (VITAMIN C) 1000 MG tablet Take 1,000 mg by mouth at bedtime.   azelastine  (ASTELIN ) 0.1 % nasal spray Place 1 spray into both nostrils 2 (two) times daily. Use in each nostril as directed   cetirizine (ZYRTEC) 10 MG tablet Take 10 mg by mouth at bedtime.    diphenhydrAMINE  (BENADRYL ) 2 % cream Apply 1 application topically 3 (three) times daily as needed (for hives.).   diphenhydrAMINE  (BENADRYL ) 25 mg capsule Take 25-50 mg by mouth every 6 (six) hours as needed (for hives.).    meloxicam  (MOBIC ) 7.5 MG tablet Take 1 tablet (7.5 mg) by mouth daily. (Patient taking differently: Take 7.5 mg by mouth as needed.)   metFORMIN  (GLUCOPHAGE -XR) 500 MG 24 hr tablet TAKE 1 TABLET BY MOUTH ONCE DAILY   Norethin  Ace-Eth  Estrad-FE (MINASTRIN 24 FE) 1-20 MG-MCG(24) CHEW Chew 1 tablet by mouth daily.   norethindrone -ethinyl estradiol -FE (JUNEL  FE 1/20) 1-20 MG-MCG tablet Take 1 tablet by mouth daily.   nystatin  (MYCOSTATIN /NYSTOP ) powder APPLY TO THE AFFECTED AREA(S) 2 TIMES PER DAY AS DIRECTED.   omeprazole  (PRILOSEC) 40 MG capsule Take 1 capsule (40 mg total) by mouth 2 (two) times daily.   VITAMIN D  PO Take 2,000 Units by mouth daily.     Allergies:   Aspirin, Chlorhexidine , Esomeprazole, Nsaids, Chlorhexidine  gluconate, Esomeprazole magnesium, and Iodinated contrast media   Social History   Socioeconomic History   Marital status: Married    Spouse name: Not on file   Number of  children: 0   Years of education: college   Highest education level: Master's degree (e.g., MA, MS, MEng, MEd, MSW, MBA)  Occupational History   Occupation: Charity fundraiser   Occupation: Engineer, civil (consulting) works for [employer]  Tobacco Use   Smoking status: Never    Passive exposure: Never   Smokeless tobacco: Never  Vaping Use   Vaping status: Never Used  Substance and Sexual Activity   Alcohol use: No   Drug use: No   Sexual activity: Not Currently    Birth control/protection: Pill  Other Topics Concern   Not on file  Social History Narrative   Lives at home with her husband.   Right-handed.   1 cup coffee per day.   Social Drivers of Corporate investment banker Strain: Low Risk  (06/23/2024)   Overall Financial Resource Strain (CARDIA)    Difficulty of Paying Living Expenses: Not hard at all  Food Insecurity: No Food Insecurity (06/23/2024)   Hunger Vital Sign    Worried About Running Out of Food in the Last Year: Never true    Ran Out of Food in the Last Year: Never true  Transportation Needs: No Transportation Needs (06/23/2024)   PRAPARE - Administrator, Civil Service (Medical): No    Lack of Transportation (Non-Medical): No  Physical Activity: Inactive (06/23/2024)   Exercise Vital Sign    Days of Exercise per Week: 0 days    Minutes of Exercise per Session: Not on file  Stress: Stress Concern Present (06/23/2024)   [employer] of Occupational Health - Occupational Stress Questionnaire    Feeling of Stress: Rather much  Social Connections: Socially Isolated (06/23/2024)   Social Connection and Isolation Panel    Frequency of Communication with Friends and Family: Once a week    Frequency of Social Gatherings with Friends and Family: Once a week    Attends Religious Services: Never    Database administrator or Organizations: No    Attends Engineer, structural: Not on file    Marital Status: Married     Family History: The patient's family history includes Anxiety  disorder in her brother; Cataracts in her brother; Colon cancer in her paternal grandmother; Depression in her brother; Diabetes in her paternal grandfather; Diabetes type II in her brother and mother; Glaucoma in her father; Heart attack in her maternal grandmother; Heart disease in her father, maternal grandmother, mother, and paternal grandfather; Hypertension in her brother; Kidney failure in her mother; Other in her maternal grandfather; Sleep apnea in her mother; Tongue cancer in her father. There is no history of Allergic rhinitis, Angioedema, Asthma, Eczema, Immunodeficiency, or Urticaria.  ROS:   Please see the history of present illness.     All other systems reviewed and are negative.  EKGs/Labs/Other  Studies Reviewed:    The following studies were reviewed today:  EKG Interpretation Date/Time:  Monday June 24 2024 09:55:24 EDT Ventricular Rate:  91 PR Interval:  154 QRS Duration:  82 QT Interval:  366 QTC Calculation: 450 R Axis:   -4  Text Interpretation: Normal sinus rhythm Normal ECG When compared with ECG of 12-Apr-2024 08:12, No significant change was found Confirmed by Madie Slough ([id number]) on 06/24/2024 9:57:41 AM    Recent Labs: 07/13/2023: ALT 37; BUN 14; Creatinine, Ser 0.65; Potassium 3.7; Sodium 134  Recent Lipid Panel No results found for: CHOL, TRIG, HDL, CHOLHDL, VLDL, LDLCALC, LDLDIRECT   Risk Assessment/Calculations:                Physical Exam:    VS:  BP 134/86   Pulse 93   Ht 5' 4 (1.626 m)   Wt 239 lb 6.4 oz (108.6 kg)   SpO2 99%   BMI 41.09 kg/m     Wt Readings from Last 3 Encounters:  06/24/24 239 lb 6.4 oz (108.6 kg)  05/23/24 243 lb (110.2 kg)  04/12/24 239 lb (108.4 kg)     GEN:  Well nourished, well developed in no acute distress HEENT: Normal NECK: No JVD; No carotid bruits LYMPHATICS: No lymphadenopathy CARDIAC: RRR, no murmurs, rubs, gallops RESPIRATORY:  Clear to auscultation without rales, wheezing or  rhonchi  ABDOMEN: Soft, non-tender, non-distended MUSCULOSKELETAL:  No edema; No deformity  SKIN: Warm and dry NEUROLOGIC:  Alert and oriented x 3 PSYCHIATRIC:  Normal affect   ASSESSMENT:    1. Chest pain of uncertain etiology   2. Primary hypertension   3. Gastroesophageal reflux disease, unspecified whether esophagitis present   4. Anxiety   5. Exercise-induced asthma    PLAN:    In order of problems listed above:  Hypertension Diastolic hypertension - Continue 2.5 mg amlodipine  at night - has not yet started -May consider ARB given prediabetes   Chest discomfort with exertion - Reassuring echocardiogram - Symptoms persist -I suspected intermittent chest discomfort possibly due to microvascular disease, esophageal spasm, or GERD.  Prior cardiac evaluations have been reassuring. -- EKG today appears unchanged - given ongoing symptoms with reassuring CT coronary in 2023, will obtain PET stress test in [place] -- will check lipid panel   GERD - Refer to GI   Exercise-induced asthma suspected - Refer to pulmonology   Follow up in 2 months with me or Dr. Verlin.  ADDENDUM: PT called stating she was having elevated BP. I increased her amlodipine  to 5 mg over the phone.      Informed Consent   Shared Decision Making/Informed Consent The risks [chest pain, shortness of breath, cardiac arrhythmias, dizziness, blood pressure fluctuations, myocardial infarction, stroke/transient ischemic attack, nausea, vomiting, allergic reaction, radiation exposure, metallic taste sensation and life-threatening complications (estimated to be 1 in 10,000)], benefits (risk stratification, diagnosing coronary artery disease, treatment guidance) and alternatives of a cardiac PET stress test were discussed in detail with Christy Alexander and she agrees to proceed.       Medication Adjustments/Labs and Tests Ordered: Current medicines are reviewed at length with the patient today.  Concerns  regarding medicines are outlined above.  Orders Placed This Encounter  Procedures   NM PET CT CARDIAC PERFUSION MULTI W/ABSOLUTE BLOODFLOW   Comprehensive metabolic panel with GFR   Lipid Profile   Cardiac Stress Test: Informed Consent Details: Physician/Practitioner Attestation; Transcribe to consent form and obtain patient signature   EKG 12-Lead  Meds ordered this encounter  Medications   amLODipine  (NORVASC ) 2.5 MG tablet    Sig: Take 1 tablet (2.5 mg total) by mouth daily.    Patient Instructions  Thank you for choosing [facility]!     Medication Instructions:  Start Amlodipine  2.5mg . take this table once nightly. *If you need a refill on your cardiac medications before your next appointment, please call your pharmacy*   Lab Work: CMET, LIPID If you have labs (blood work) drawn today and your tests are completely normal, you will receive your results only by: MyChart Message (if you have MyChart) OR A paper copy in the mail If you have any lab test that is abnormal or we need to change your treatment, we will call you to review the results.   Testing/Procedures: CARDIAC PET- Your physician has requested that you have a Cardiac Pet Stress Test.   This testing is completed at [facility] ([address] [place], [place] [address]) or [facility] ([address], [place], [place]). Please arrive 30 minutes prior to your scheduled time.  The schedulers will call you to get this scheduled. Please follow further testing instructions below.   Your next appointment:   2 month(s)   Provider:   Jon Hails, PA-C           Follow-Up: At [facility], you and your health needs are our priority.  As part of our continuing mission to provide you with exceptional heart care, we have created designated Provider Care Teams.  These Care Teams include your primary Cardiologist (physician) and Advanced Practice  Providers (APPs -  Physician Assistants and Nurse Practitioners) who all work together to provide you with the care you need, when you need it. We recommend signing up for the patient portal called MyChart.  Sign up information is provided on this After Visit Summary.  MyChart is used to connect with patients for Virtual Visits (Telemedicine).  Patients are able to view lab/test results, encounter notes, upcoming appointments, etc.  Non-urgent messages can be sent to your provider as well.   To learn more about what you can do with MyChart, go to [email or web address].      Signed, Jon Nat Hails, PA  06/24/2024 10:16 AM    [facility]

## 2024-06-24 ENCOUNTER — Other Ambulatory Visit: Payer: Self-pay

## 2024-06-24 ENCOUNTER — Telehealth: Payer: Self-pay | Admitting: Physician Assistant

## 2024-06-24 ENCOUNTER — Ambulatory Visit: Admitting: Internal Medicine

## 2024-06-24 ENCOUNTER — Other Ambulatory Visit (HOSPITAL_COMMUNITY): Payer: Self-pay

## 2024-06-24 ENCOUNTER — Ambulatory Visit: Attending: Physician Assistant | Admitting: Physician Assistant

## 2024-06-24 ENCOUNTER — Encounter: Payer: Self-pay | Admitting: Internal Medicine

## 2024-06-24 ENCOUNTER — Encounter: Payer: Self-pay | Admitting: Physician Assistant

## 2024-06-24 VITALS — BP 122/88 | HR 86 | Temp 98.0°F | Ht 64.0 in | Wt 238.8 lb

## 2024-06-24 VITALS — BP 134/86 | HR 93 | Ht 64.0 in | Wt 239.4 lb

## 2024-06-24 DIAGNOSIS — R0789 Other chest pain: Secondary | ICD-10-CM | POA: Insufficient documentation

## 2024-06-24 DIAGNOSIS — E890 Postprocedural hypothyroidism: Secondary | ICD-10-CM

## 2024-06-24 DIAGNOSIS — K760 Fatty (change of) liver, not elsewhere classified: Secondary | ICD-10-CM | POA: Diagnosis not present

## 2024-06-24 DIAGNOSIS — R079 Chest pain, unspecified: Secondary | ICD-10-CM | POA: Diagnosis not present

## 2024-06-24 DIAGNOSIS — I10 Essential (primary) hypertension: Secondary | ICD-10-CM

## 2024-06-24 DIAGNOSIS — R700 Elevated erythrocyte sedimentation rate: Secondary | ICD-10-CM | POA: Diagnosis not present

## 2024-06-24 DIAGNOSIS — R631 Polydipsia: Secondary | ICD-10-CM

## 2024-06-24 DIAGNOSIS — K219 Gastro-esophageal reflux disease without esophagitis: Secondary | ICD-10-CM

## 2024-06-24 DIAGNOSIS — G8918 Other acute postprocedural pain: Secondary | ICD-10-CM

## 2024-06-24 DIAGNOSIS — R3129 Other microscopic hematuria: Secondary | ICD-10-CM

## 2024-06-24 DIAGNOSIS — R7303 Prediabetes: Secondary | ICD-10-CM

## 2024-06-24 DIAGNOSIS — N3941 Urge incontinence: Secondary | ICD-10-CM

## 2024-06-24 DIAGNOSIS — M51369 Other intervertebral disc degeneration, lumbar region without mention of lumbar back pain or lower extremity pain: Secondary | ICD-10-CM

## 2024-06-24 DIAGNOSIS — R87619 Unspecified abnormal cytological findings in specimens from cervix uteri: Secondary | ICD-10-CM | POA: Insufficient documentation

## 2024-06-24 DIAGNOSIS — F419 Anxiety disorder, unspecified: Secondary | ICD-10-CM

## 2024-06-24 DIAGNOSIS — R3589 Other polyuria: Secondary | ICD-10-CM

## 2024-06-24 DIAGNOSIS — E559 Vitamin D deficiency, unspecified: Secondary | ICD-10-CM

## 2024-06-24 DIAGNOSIS — J45990 Exercise induced bronchospasm: Secondary | ICD-10-CM

## 2024-06-24 DIAGNOSIS — M26609 Unspecified temporomandibular joint disorder, unspecified side: Secondary | ICD-10-CM

## 2024-06-24 DIAGNOSIS — J329 Chronic sinusitis, unspecified: Secondary | ICD-10-CM

## 2024-06-24 LAB — HEMOGLOBIN A1C: Hgb A1c MFr Bld: 6.5 % (ref 4.6–6.5)

## 2024-06-24 LAB — CBC WITH DIFFERENTIAL/PLATELET
Basophils Absolute: 0 K/uL (ref 0.0–0.1)
Basophils Relative: 0.6 % (ref 0.0–3.0)
Eosinophils Absolute: 0.1 K/uL (ref 0.0–0.7)
Eosinophils Relative: 1 % (ref 0.0–5.0)
HCT: 38.9 % (ref 36.0–46.0)
Hemoglobin: 13.1 g/dL (ref 12.0–15.0)
Lymphocytes Relative: 38.4 % (ref 12.0–46.0)
Lymphs Abs: 3.1 K/uL (ref 0.7–4.0)
MCHC: 33.7 g/dL (ref 30.0–36.0)
MCV: 88.5 fl (ref 78.0–100.0)
Monocytes Absolute: 0.4 K/uL (ref 0.1–1.0)
Monocytes Relative: 4.4 % (ref 3.0–12.0)
Neutro Abs: 4.5 K/uL (ref 1.4–7.7)
Neutrophils Relative %: 55.6 % (ref 43.0–77.0)
Platelets: 304 K/uL (ref 150.0–400.0)
RBC: 4.4 Mil/uL (ref 3.87–5.11)
RDW: 13.1 % (ref 11.5–15.5)
WBC: 8.1 K/uL (ref 4.0–10.5)

## 2024-06-24 LAB — VITAMIN D 25 HYDROXY (VIT D DEFICIENCY, FRACTURES): VITD: 63.69 ng/mL (ref 30.00–100.00)

## 2024-06-24 LAB — SEDIMENTATION RATE: Sed Rate: 29 mm/h — ABNORMAL HIGH (ref 0–20)

## 2024-06-24 MED ORDER — AMLODIPINE BESYLATE 2.5 MG PO TABS: 2.5000 mg | ORAL_TABLET | Freq: Every day | ORAL | Status: DC

## 2024-06-24 MED ORDER — OXYCODONE-ACETAMINOPHEN 5-325 MG PO TABS
1.0000 | ORAL_TABLET | ORAL | 0 refills | Status: DC | PRN
  Filled 2024-06-24: qty 3, 1d supply, fill #0

## 2024-06-24 MED ORDER — TIRZEPATIDE-WEIGHT MANAGEMENT 2.5 MG/0.5ML ~~LOC~~ SOAJ
2.5000 mg | SUBCUTANEOUS | 11 refills | Status: DC
  Filled 2024-06-24: qty 2, 28d supply, fill #0

## 2024-06-24 MED ORDER — AMLODIPINE BESYLATE 2.5 MG PO TABS
2.5000 mg | ORAL_TABLET | Freq: Every day | ORAL | 3 refills | Status: DC
  Filled 2024-06-24: qty 90, 90d supply, fill #0

## 2024-06-24 NOTE — Assessment & Plan Note (Signed)
 Obesity with BMI of 40 and associated fatty liver disease. Discussed Zepbound for weight loss, noting potential for significant weight loss compared to Saxenda. Addressed set point theory and challenges of maintaining weight loss post-medication. Zepbound offers a 25% weight loss compared to 5% with Saxenda. Emphasized need for permanent lifestyle changes to prevent post-medication weight gain. Submit test claim for Zepbound to insurance, encourage dietary changes and increased physical activity, and schedule follow-up with nutritionist.

## 2024-06-24 NOTE — Assessment & Plan Note (Signed)
 Prediabetes with recent symptoms suggestive of possible progression to diabetes, including frequent urination and dry mouth at night. Order hemoglobin A1c as part of general health panel.

## 2024-06-24 NOTE — Assessment & Plan Note (Signed)
 Chronic pelvic pain with urinary urgency, frequency, and microscopic hematuria (suspected interstitial cystitis)   Chronic pelvic pain with urinary urgency, frequency, and microscopic hematuria suggests interstitial cystitis. Differential includes urinary tract infection and overactive bladder. Bladder cancer and renal cell carcinoma are unlikely due to non-smoking history. Bladder dilation and Myrbetriq were considered, but insurance coverage is a concern. Order urinalysis and microscopy, refer to urology, discuss pelvic floor training exercises and bladder timing, and consider Myrbetriq if insurance allows.

## 2024-06-24 NOTE — Telephone Encounter (Signed)
 Prescription has been resent. Pt contacted and advised.

## 2024-06-24 NOTE — Assessment & Plan Note (Signed)
 Chronic back pain with history of lumbar disc herniation and surgery. Re-herniation noted a few years ago, with previous surgery making further surgical intervention challenging.

## 2024-06-24 NOTE — Assessment & Plan Note (Signed)
 History of right thyroid  lobectomy for benign nodule. No current thyroid  medication. Desires verification of calcium  and thyroid  function. Order parathyroid hormone, TSH, and free T4 as part of general health panel.

## 2024-06-24 NOTE — Patient Instructions (Signed)
 Thank you for choosing [facility]!     Medication Instructions:  Start Amlodipine  2.5mg . take this table once nightly. *If you need a refill on your cardiac medications before your next appointment, please call your pharmacy*   Lab Work: CMET, LIPID If you have labs (blood work) drawn today and your tests are completely normal, you will receive your results only by: MyChart Message (if you have MyChart) OR A paper copy in the mail If you have any lab test that is abnormal or we need to change your treatment, we will call you to review the results.   Testing/Procedures: CARDIAC PET- Your physician has requested that you have a Cardiac Pet Stress Test.   This testing is completed at [facility] ([address] [place], [place] [address]) or [facility] ([address] [place], [place]). Please arrive 30 minutes prior to your scheduled time.  The schedulers will call you to get this scheduled. Please follow further testing instructions below.   Your next appointment:   2 month(s)   Provider:   Jon Hails, PA-C           Follow-Up: At [facility], you and your health needs are our priority.  As part of our continuing mission to provide you with exceptional heart care, we have created designated Provider Care Teams.  These Care Teams include your primary Cardiologist (physician) and Advanced Practice Providers (APPs -  Physician Assistants and Nurse Practitioners) who all work together to provide you with the care you need, when you need it. We recommend signing up for the patient portal called MyChart.  Sign up information is provided on this After Visit Summary.  MyChart is used to connect with patients for Virtual Visits (Telemedicine).  Patients are able to view lab/test results, encounter notes, upcoming appointments, etc.  Non-urgent messages can be sent to your provider as well.   To learn more about what  you can do with MyChart, go to [email or web address].

## 2024-06-24 NOTE — Assessment & Plan Note (Signed)
 Vitamin D  deficiency, previously borderline corrected. Order vitamin D  level as part of general health panel.

## 2024-06-24 NOTE — Patient Instructions (Addendum)
 It was a pleasure seeing you today! Your health and satisfaction are our top priorities.  Bernardino Cone, MD  VISIT SUMMARY: During today's visit, we discussed your chest discomfort, urinary symptoms, and several other ongoing health issues. We reviewed your history of back surgery, chronic pelvic pain, prediabetes, and other conditions. We also talked about your weight loss efforts and the potential use of Zepbound for weight management. Additionally, we addressed your concerns about your thyroid  and calcium  levels, and we planned further evaluations and follow-ups for your various symptoms.  YOUR PLAN: -CHRONIC PELVIC PAIN WITH URINARY URGENCY, FREQUENCY, AND MICROSCOPIC HEMATURIA (SUSPECTED INTERSTITIAL CYSTITIS): Your symptoms suggest interstitial cystitis, a condition causing bladder pain and frequent urination. We will perform a urinalysis and microscopy, refer you to a urologist, discuss pelvic floor exercises, and consider Myrbetriq if your insurance covers it.  -OBESITY WITH FATTY LIVER DISEASE: Your BMI indicates obesity, which is associated with fatty liver disease. We discussed the potential use of Zepbound for weight loss, which may help you lose up to 25% of your weight. We will submit a test claim to your insurance and encourage you to continue with dietary changes and increased physical activity. A follow-up with a nutritionist is also planned.  -PREDIABETES: Prediabetes means your blood sugar levels are higher than normal but not yet high enough to be classified as diabetes. We will check your hemoglobin A1c as part of your general health panel to monitor this condition.  -HYPERTENSION: Hypertension, or high blood pressure, can lead to serious health issues if not managed. Continue taking amlodipine  as prescribed and monitor your blood pressure regularly.  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 Further surgical intervention is challenging due to your surgical history.  -CHRONIC LEFT KNEE PAIN POST-TRAUMA: Your left knee pain is a result of a past injury. We noted your recent evaluation by an orthopedic specialist.  -BILATERAL SHOULDER PAIN: You have ongoing shoulder pain, which is currently better but still present.  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 We discussed getting an occlusal splint fitted to help manage your symptoms and prescribed Percocet for pain management if needed.  -LARYNGOPHARYNGEAL REFLUX (LPR): LPR is a type of acid reflux that affects your throat and voice. It is managed similarly to GERD.  -RECURRENT SINUSITIS AND ALLERGIC RHINITIS: You have recurrent sinus infections and allergies. Previous evaluations did not indicate the need for surgery.  -VITAMIN D  DEFICIENCY: Vitamin D  deficiency can affect bone health and overall well-being. We will check your vitamin D  levels as part of your general health panel.  -HISTORY OF RIGHT THYROID  LOBECTOMY FOR BENIGN NODULE: You had surgery  to remove a benign nodule from your thyroid . We will check your calcium  and thyroid  function as part of your general health panel.  INSTRUCTIONS: Please follow up with the urologist as referred. Continue monitoring your blood pressure and take amlodipine  as prescribed. Schedule an appointment with the nutritionist to discuss your weight loss plan. We will perform a urinalysis, hemoglobin A1c, vitamin D  level, parathyroid hormone, TSH, and free T4 tests as part of your general health panel. Consider pelvic floor exercises and bladder timing techniques for your urinary symptoms. If you experience any new or worsening symptoms, please contact our office.  Your Providers PCP: Cone Bernardino MATSU, MD,  [phone]) Referring Provider: Gladystine Erminio CROME,  MD,   [phone]) Care Team Provider: Carlie Clark, MD,  [phone]) Care Team Provider: Livingston Rigg, MD,  [phone]) Care Team Provider: Verlin Lonni BIRCH, MD,  [phone]) Care Team Provider: Madie Jon Garre, [place],  [phone])  NEXT STEPS: [x]  Early Intervention: Schedule sooner appointment, call our on-call services, or go to emergency room if there is any significant Increase in pain or discomfort New or worsening symptoms Sudden or severe changes in your health [x]  Flexible Follow-Up: We recommend a Return in about 1 month (around 07/25/2024) for chronic disease monitoring and management, annual preventive care visit. for optimal routine care. This allows for progress monitoring and treatment adjustments. [x]  Preventive Care: Schedule your annual preventive care visit! It's typically covered by insurance and helps identify potential health issues early. [x]  Lab & X-ray Appointments: Incomplete tests scheduled today, or call to schedule. X-rays: [facility] (M-F, 8:30am-noon or 1pm-5pm). [x]  Medical Information Release: Sign a release form at front desk to obtain relevant medical information we don't have.  MAKING THE MOST OF OUR FOCUSED 20 MINUTE APPOINTMENTS: [x]   Clearly state your top concerns at the beginning of the visit to focus our discussion [x]   If you anticipate you will need more time, please inform the front desk during scheduling - we can book multiple appointments in the same week. [x]   If you have transportation problems- use our convenient video appointments or ask about transportation support. [x]   We can get down to business faster if you use MyChart to update information before the visit and submit non-urgent questions before your visit. Thank you for taking the time to provide details through MyChart.  Let our nurse know and she can import this information into your encounter documents.  Arrival and Wait Times: [x]   Arriving on  time ensures that everyone receives prompt attention. [x]   Early morning (8a) and afternoon (1p) appointments tend to have shortest wait times. [x]   Unfortunately, we cannot delay appointments for late arrivals or hold slots during phone calls.  Getting Answers and Following Up [x]   Simple Questions & Concerns: For quick questions or basic follow-up after your visit, reach us  at [phone] or MyChart messaging. [x]   Complex Concerns: If your concern is more complex, scheduling an appointment might be best. Discuss this with the staff to find the most suitable option. [x]   Lab & Imaging Results: We'll contact you directly if results are abnormal or you don't use MyChart. Most normal results will be on MyChart within 2-3 business days, with a review message from Dr. Jesus. Haven't heard back in 2 weeks? Need results sooner? Contact us  at [phone]. [x]   Referrals: Our referral coordinator will manage specialist referrals. The specialist's office should contact you within 2 weeks to schedule an appointment. Call us  if you haven't heard from them after 2 weeks.  Staying Connected [x]   MyChart: Activate your MyChart for the fastest way to access results and message us . See the last page of this paperwork for instructions on how to activate.  Bring to Your Next Appointment [x]   Medications: Please bring all your medication bottles to your next appointment to ensure we have an accurate record of your prescriptions. [x]   Health Diaries: If you're monitoring any health conditions at home, keeping a diary of your readings can be very helpful for discussions at your next appointment.  Billing [x]   X-ray & Lab Orders: These are billed by separate companies. Contact the invoicing company directly for questions or concerns. [x]   Visit Charges: Discuss any billing inquiries with our administrative services team.  Your Satisfaction Matters [x]   Share Your Experience: We strive for your  satisfaction! If you have any complaints, or preferably compliments, please let Dr. Jesus know directly or contact our Practice Administrators, Manuelita Rubin or [employer], by asking at the front desk.   Reviewing Your Records [x]   Review this early draft of your clinical encounter notes below and the final encounter summary tomorrow on MyChart after its been completed.  All orders placed so far are visible here: NAFLD (nonalcoholic fatty liver disease) Assessment & Plan: Obesity with BMI of 40 and associated fatty liver disease. Discussed Zepbound for weight loss, noting potential for significant weight loss compared to Saxenda. Addressed set point theory and challenges of maintaining weight loss post-medication. Zepbound offers a 25% weight loss compared to 5% with Saxenda. Emphasized need for permanent lifestyle changes to prevent post-medication weight gain. Submit test claim for Zepbound to insurance, encourage dietary changes and increased physical activity, and schedule follow-up with nutritionist.  Orders: -     Tirzepatide-Weight Management; Inject 2.5 mg into the skin once a week.  Dispense: 2 mL; Refill: 11  Morbid obesity (HCC) Assessment & Plan: Obesity with BMI of 40 and associated fatty liver disease. Discussed Zepbound for weight loss, noting potential for significant weight loss compared to Saxenda. Addressed set point theory and challenges of maintaining weight loss post-medication. Zepbound offers a 25% weight loss compared to 5% with Saxenda. Emphasized need for permanent lifestyle changes to prevent post-medication weight gain. Submit test claim for Zepbound to insurance, encourage dietary changes and increased physical activity, and schedule follow-up with nutritionist.  Orders: -     Lipid panel -     Hemoglobin A1c  Steatosis of liver Assessment & Plan: Obesity with BMI of 40 and associated fatty liver disease. Discussed Zepbound for weight loss, noting potential  for significant weight loss compared to Saxenda. Addressed set point theory and challenges of maintaining weight loss post-medication. Zepbound offers a 25% weight loss compared to 5% with Saxenda. Emphasized need for permanent lifestyle changes to prevent post-medication weight gain. Submit test claim for Zepbound to insurance, encourage dietary changes and increased physical activity, and schedule follow-up with nutritionist.  Orders: -     Comprehensive metabolic panel with GFR -     CBC with Differential/Platelet -     Tirzepatide-Weight Management; Inject 2.5 mg into the skin once a week.  Dispense: 2 mL; Refill: 11  H/O partial thyroidectomy Assessment & Plan: History of right thyroid  lobectomy for benign nodule. No current thyroid  medication. Desires verification of calcium  and thyroid  function. Order parathyroid hormone, TSH, and free T4 as part of general health panel.  Orders: -     Parathyroid hormone, intact (no Ca) -     TSH + free T4  Chest discomfort  Vitamin D  deficiency Assessment & Plan: Vitamin D  deficiency, previously borderline corrected. Order vitamin D  level as part of general health panel.  Orders: -     VITAMIN D  25 Hydroxy (Vit-D Deficiency, Fractures) -     Parathyroid hormone, intact (no Ca)  Polydipsia  Polyuria Assessment & Plan: Chronic pelvic pain with urinary urgency, frequency, and microscopic hematuria (suspected interstitial cystitis)   Chronic pelvic pain with urinary urgency, frequency, and microscopic hematuria suggests interstitial cystitis. Differential includes urinary tract infection and overactive bladder. Bladder cancer and renal cell carcinoma are unlikely due to non-smoking history. Bladder dilation and Myrbetriq were considered, but insurance  coverage is a concern. Order urinalysis and microscopy, refer to urology, discuss pelvic floor training exercises and bladder timing, and consider Myrbetriq if insurance allows.   Microscopic  hematuria Assessment & Plan: Chronic pelvic pain with urinary urgency, frequency, and microscopic hematuria (suspected interstitial cystitis)   Chronic pelvic pain with urinary urgency, frequency, and microscopic hematuria suggests interstitial cystitis. Differential includes urinary tract infection and overactive bladder. Bladder cancer and renal cell carcinoma are unlikely due to non-smoking history. Bladder dilation and Myrbetriq were considered, but insurance coverage is a concern. Order urinalysis and microscopy, refer to urology, discuss pelvic floor training exercises and bladder timing, and consider Myrbetriq if insurance allows.   Urge incontinence Assessment & Plan: Chronic pelvic pain with urinary urgency, frequency, and microscopic hematuria (suspected interstitial cystitis)   Chronic pelvic pain with urinary urgency, frequency, and microscopic hematuria suggests interstitial cystitis. Differential includes urinary tract infection and overactive bladder. Bladder cancer and renal cell carcinoma are unlikely due to non-smoking history. Bladder dilation and Myrbetriq were considered, but insurance coverage is a concern. Order urinalysis and microscopy, refer to urology, discuss pelvic floor training exercises and bladder timing, and consider Myrbetriq if insurance allows.  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 Discussed potential for occlusal splint to manage symptoms. Previous dental trays were unsuccessful due to fitting issues. Provide referral for occlusal splint fitting and prescribe Percocet for procedural pain management if needed.  Orders: -     Splint  Pain associated with surgical procedure -     oxyCODONE -Acetaminophen ; Take 1 tablet by mouth every 4 (four) hours as needed for up to 5 days for severe pain (pain score 7-10).  Dispense: 3  tablet; Refill: 0  Elevated sed rate Assessment & Plan: Shared decision-making done; patient understood rationale and agreed to labwork   Orders: -     Sedimentation rate -     C-reactive protein  Prediabetes Assessment & Plan: Prediabetes with recent symptoms suggestive of possible progression to diabetes, including frequent urination and dry mouth at night. Order hemoglobin A1c as part of general health panel.   Hypertension, unspecified type  Degeneration of intervertebral disc of lumbar region, unspecified whether pain present Assessment & Plan: Chronic back pain with history of lumbar disc herniation and surgery. Re-herniation noted a few years ago, with previous surgery making further surgical intervention challenging.   Laryngopharyngeal reflux (LPR)  Recurrent sinusitis Assessment & Plan: Recurrent sinusitis with recent episodes. Previous ENT evaluation did not indicate need for surgery.        It was a pleasure seeing you today! Your health and satisfaction are our top priorities.  Bernardino Cone, MD  VISIT SUMMARY: During today's visit, we discussed your chest discomfort, urinary symptoms, and several other ongoing health issues. We reviewed your history of back surgery, chronic pelvic pain, prediabetes, and other conditions. We also talked about your weight loss efforts and the potential use of Zepbound for weight management. Additionally, we addressed your concerns about your thyroid  and calcium  levels, and we planned further evaluations and follow-ups for your various symptoms.  YOUR PLAN: -CHRONIC PELVIC PAIN WITH URINARY URGENCY, FREQUENCY, AND MICROSCOPIC HEMATURIA (SUSPECTED INTERSTITIAL CYSTITIS): Your symptoms suggest interstitial cystitis, a condition causing bladder pain and frequent urination. We will perform a urinalysis and microscopy, refer you to a urologist, discuss pelvic floor exercises, and consider Myrbetriq if your  insurance covers it.  -OBESITY  WITH FATTY LIVER DISEASE: Your BMI indicates obesity, which is associated with fatty liver disease. We discussed the potential use of Zepbound for weight loss, which may help you lose up to 25% of your weight. We will submit a test claim to your insurance and encourage you to continue with dietary changes and increased physical activity. A follow-up with a nutritionist is also planned.  -PREDIABETES: Prediabetes means your blood sugar levels are higher than normal but not yet high enough to be classified as diabetes. We will check your hemoglobin A1c as part of your general health panel to monitor this condition.  -HYPERTENSION: Hypertension, or high blood pressure, can lead to serious health issues if not managed. Continue taking amlodipine  as prescribed and monitor your blood pressure regularly.  -CHRONIC BACK PAIN WITH HISTORY OF LUMBAR DISC HERNIATION AND SURGERY: Your chronic back pain is related to your previous lumbar disc herniation and surgeries. Further surgical intervention is challenging due to your surgical history.  -CHRONIC LEFT KNEE PAIN POST-TRAUMA: Your left knee pain is a result of a past injury. We noted your recent evaluation by an orthopedic specialist.  -BILATERAL SHOULDER PAIN: You have ongoing shoulder pain, which is currently better but still present.  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 We discussed getting an occlusal splint fitted to help manage your symptoms and prescribed Percocet for pain management if needed.  -LARYNGOPHARYNGEAL REFLUX (LPR): LPR is a type of acid reflux that affects your throat and voice. It is managed similarly to GERD.  -RECURRENT SINUSITIS AND ALLERGIC RHINITIS: You have recurrent sinus infections and allergies. Previous evaluations did not indicate the need for surgery.  -VITAMIN D  DEFICIENCY: Vitamin D  deficiency can affect bone health and overall  well-being. We will check your vitamin D  levels as part of your general health panel.  -HISTORY OF RIGHT THYROID  LOBECTOMY FOR BENIGN NODULE: You had surgery  to remove a benign nodule from your thyroid . We will check your calcium  and thyroid  function as part of your general health panel.  INSTRUCTIONS: Please follow up with the urologist as referred. Continue monitoring your blood pressure and take amlodipine  as prescribed. Schedule an appointment with the nutritionist to discuss your weight loss plan. We will perform a urinalysis, hemoglobin A1c, vitamin D  level, parathyroid hormone, TSH, and free T4 tests as part of your general health panel. Consider pelvic floor exercises and bladder timing techniques for your urinary symptoms. If you experience any new or worsening symptoms, please contact our office.  Your Providers PCP: Jesus Bernardino MATSU, MD,  [phone]) Referring Provider: Gladystine Erminio CROME, MD,  [phone]) Care Team Provider: Carlie Clark, MD,  [phone]) Care Team Provider: Livingston Rigg, MD,  [phone]) Care Team Provider: Verlin Lonni BIRCH, MD,  [phone]) Care Team Provider: Madie Jon Garre, [place],  [phone])  NEXT STEPS: [x]  Early Intervention: Schedule sooner appointment, call our on-call services, or go to emergency room if there is any significant Increase in pain or discomfort New or worsening symptoms Sudden or severe changes in your health [x]  Flexible Follow-Up: We recommend a Return in about 1 month (around 07/25/2024) for chronic disease monitoring and management, annual preventive care visit. for optimal routine care. This allows for progress monitoring and treatment adjustments. [x]  Preventive Care: Schedule your annual preventive care visit! It's typically covered by insurance and helps identify potential health issues early. [x]  Lab & X-ray Appointments: Incomplete tests scheduled  today, or call to schedule. X-rays:  [facility] (M-F, 8:30am-noon or 1pm-5pm). [x]  Medical Information Release: Sign a release form at front desk to obtain relevant medical information we don't have.  MAKING THE MOST OF OUR FOCUSED 20 MINUTE APPOINTMENTS: [x]   Clearly state your top concerns at the beginning of the visit to focus our discussion [x]   If you anticipate you will need more time, please inform the front desk during scheduling - we can book multiple appointments in the same week. [x]   If you have transportation problems- use our convenient video appointments or ask about transportation support. [x]   We can get down to business faster if you use MyChart to update information before the visit and submit non-urgent questions before your visit. Thank you for taking the time to provide details through MyChart.  Let our nurse know and she can import this information into your encounter documents.  Arrival and Wait Times: [x]   Arriving on time ensures that everyone receives prompt attention. [x]   Early morning (8a) and afternoon (1p) appointments tend to have shortest wait times. [x]   Unfortunately, we cannot delay appointments for late arrivals or hold slots during phone calls.  Getting Answers and Following Up [x]   Simple Questions & Concerns: For quick questions or basic follow-up after your visit, reach us  at [phone] or MyChart messaging. [x]   Complex Concerns: If your concern is more complex, scheduling an appointment might be best. Discuss this with the staff to find the most suitable option. [x]   Lab & Imaging Results: We'll contact you directly if results are abnormal or you don't use MyChart. Most normal results will be on MyChart within 2-3 business days, with a review message from Dr. Jesus. Haven't heard back in 2 weeks? Need results sooner? Contact us  at [phone]. [x]   Referrals: Our referral coordinator will manage specialist referrals. The specialist's office should contact you within 2 weeks to  schedule an appointment. Call us  if you haven't heard from them after 2 weeks.  Staying Connected [x]   MyChart: Activate your MyChart for the fastest way to access results and message us . See the last page of this paperwork for instructions on how to activate.  Bring to Your Next Appointment [x]   Medications: Please bring all your medication bottles to your next appointment to ensure we have an accurate record of your prescriptions. [x]   Health Diaries: If you're monitoring any health conditions at home, keeping a diary of your readings can be very helpful for discussions at your next appointment.  Billing [x]   X-ray & Lab Orders: These are billed by separate companies. Contact the invoicing company directly for questions or concerns. [x]   Visit Charges: Discuss any billing inquiries with our administrative services team.  Your Satisfaction Matters [x]   Share Your Experience: We strive for your satisfaction! If you have any complaints, or preferably compliments, please let Dr. Jesus know directly or contact our Practice Administrators, Manuelita Rubin or [employer], by asking at the front desk.   Reviewing Your Records [x]   Review this early draft of your clinical encounter notes below and the final encounter summary tomorrow on MyChart after its been completed.  All orders placed so far are visible here: NAFLD (nonalcoholic fatty liver disease) Assessment & Plan: Obesity with BMI of 40 and associated fatty liver disease. Discussed Zepbound for weight loss, noting potential for significant weight loss compared to Saxenda. Addressed set point theory and challenges of maintaining weight loss post-medication. Zepbound offers a  25% weight loss compared to 5% with Saxenda. Emphasized need for permanent lifestyle changes to prevent post-medication weight gain. Submit test claim for Zepbound to insurance, encourage dietary changes and increased physical activity, and schedule follow-up with  nutritionist.  Orders: -     Tirzepatide-Weight Management; Inject 2.5 mg into the skin once a week.  Dispense: 2 mL; Refill: 11  Morbid obesity (HCC) Assessment & Plan: Obesity with BMI of 40 and associated fatty liver disease. Discussed Zepbound for weight loss, noting potential for significant weight loss compared to Saxenda. Addressed set point theory and challenges of maintaining weight loss post-medication. Zepbound offers a 25% weight loss compared to 5% with Saxenda. Emphasized need for permanent lifestyle changes to prevent post-medication weight gain. Submit test claim for Zepbound to insurance, encourage dietary changes and increased physical activity, and schedule follow-up with nutritionist.  Orders: -     Lipid panel -     Hemoglobin A1c  Steatosis of liver Assessment & Plan: Obesity with BMI of 40 and associated fatty liver disease. Discussed Zepbound for weight loss, noting potential for significant weight loss compared to Saxenda. Addressed set point theory and challenges of maintaining weight loss post-medication. Zepbound offers a 25% weight loss compared to 5% with Saxenda. Emphasized need for permanent lifestyle changes to prevent post-medication weight gain. Submit test claim for Zepbound to insurance, encourage dietary changes and increased physical activity, and schedule follow-up with nutritionist.  Orders: -     Comprehensive metabolic panel with GFR -     CBC with Differential/Platelet -     Tirzepatide-Weight Management; Inject 2.5 mg into the skin once a week.  Dispense: 2 mL; Refill: 11  H/O partial thyroidectomy Assessment & Plan: History of right thyroid  lobectomy for benign nodule. No current thyroid  medication. Desires verification of calcium  and thyroid  function. Order parathyroid hormone, TSH, and free T4 as part of general health panel.  Orders: -     Parathyroid hormone, intact (no Ca) -     TSH + free T4  Chest discomfort  Vitamin D   deficiency Assessment & Plan: Vitamin D  deficiency, previously borderline corrected. Order vitamin D  level as part of general health panel.  Orders: -     VITAMIN D  25 Hydroxy (Vit-D Deficiency, Fractures) -     Parathyroid hormone, intact (no Ca)  Polydipsia  Polyuria Assessment & Plan: Chronic pelvic pain with urinary urgency, frequency, and microscopic hematuria (suspected interstitial cystitis)   Chronic pelvic pain with urinary urgency, frequency, and microscopic hematuria suggests interstitial cystitis. Differential includes urinary tract infection and overactive bladder. Bladder cancer and renal cell carcinoma are unlikely due to non-smoking history. Bladder dilation and Myrbetriq were considered, but insurance coverage is a concern. Order urinalysis and microscopy, refer to urology, discuss pelvic floor training exercises and bladder timing, and consider Myrbetriq if insurance allows.   Microscopic hematuria Assessment & Plan: Chronic pelvic pain with urinary urgency, frequency, and microscopic hematuria (suspected interstitial cystitis)   Chronic pelvic pain with urinary urgency, frequency, and microscopic hematuria suggests interstitial cystitis. Differential includes urinary tract infection and overactive bladder. Bladder cancer and renal cell carcinoma are unlikely due to non-smoking history. Bladder dilation and Myrbetriq were considered, but insurance coverage is a concern. Order urinalysis and microscopy, refer to urology, discuss pelvic floor training exercises and bladder timing, and consider Myrbetriq if insurance allows.   Urge incontinence Assessment & Plan: Chronic pelvic pain with urinary urgency, frequency, and microscopic hematuria (suspected interstitial cystitis)   Chronic pelvic pain with  urinary urgency, frequency, and microscopic hematuria suggests interstitial cystitis. Differential includes urinary tract infection and overactive bladder. Bladder cancer and  renal cell carcinoma are unlikely due to non-smoking history. Bladder dilation and Myrbetriq were considered, but insurance coverage is a concern. Order urinalysis and microscopy, refer to urology, discuss pelvic floor training exercises and bladder timing, and consider Myrbetriq if insurance allows.  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 Discussed potential for occlusal splint to manage symptoms. Previous dental trays were unsuccessful due to fitting issues. Provide referral for occlusal splint fitting and prescribe Percocet for procedural pain management if needed.  Orders: -     Splint  Pain associated with surgical procedure -     oxyCODONE -Acetaminophen ; Take 1 tablet by mouth every 4 (four) hours as needed for up to 5 days for severe pain (pain score 7-10).  Dispense: 3 tablet; Refill: 0  Elevated sed rate Assessment & Plan: Shared decision-making done; patient understood rationale and agreed to labwork   Orders: -     Sedimentation rate -     C-reactive protein  Prediabetes Assessment & Plan: Prediabetes with recent symptoms suggestive of possible progression to diabetes, including frequent urination and dry mouth at night. Order hemoglobin A1c as part of general health panel.   Hypertension, unspecified type  Degeneration of intervertebral disc of lumbar region, unspecified whether pain present Assessment & Plan: Chronic back pain with history of lumbar disc herniation and surgery. Re-herniation noted a few years ago, with previous surgery making further surgical intervention challenging.   Laryngopharyngeal reflux (LPR)  Recurrent sinusitis Assessment & Plan: Recurrent sinusitis with recent episodes. Previous ENT evaluation did not indicate need for surgery.         Most Likely Diagnoses: 1. Uncomplicated cystitis (urinary  tract infection) is the most common cause of microscopic hematuria and urge urinary incontinence in women in their 40s. Cystitis typically presents with urgency, frequency, dysuria, and sometimes hematuria, and is very common and generally benign when promptly treated. [1] 2. Overactive bladder (OAB) is also common in this age group and presents with urgency, urge incontinence, and sometimes frequency, but hematuria is not a classic feature. OAB is a benign, chronic condition, but hematuria should prompt evaluation for other causes. [2] 3. Interstitial cystitis/bladder pain syndrome (IC/BPS) can present with urgency, frequency, and hematuria, but is less common and usually associated with chronic pelvic pain. [3] 4. Urolithiasis (urinary stones) can cause hematuria and irritative voiding symptoms, but is less likely without flank pain or a history of stones. [4] 5. Urethritis may cause hematuria and irritative symptoms, but is more often associated with dysuria and discharge.[5] Most Important Not to Miss Diagnoses: 1. Bladder cancer: Although rare in women in their 30s, bladder cancer can present with microscopic hematuria and irritative symptoms. It is not benign and must be ruled out, especially if hematuria persists or there are risk factors such as smoking. [6] 2. Renal cell carcinoma: Presents with hematuria, but usually with other findings (flank mass, pain, or systemic symptoms). Imaging is required to rule this out if hematuria is unexplained.[7] Key Additional History and Follow-Up Tests:  History: Duration and severity of symptoms, pain, fever, dysuria, sexual history, smoking status, family history of cancer.  Physical exam: Abdominal and pelvic exam.  Urinalysis and urine culture: To confirm infection and exclude other causes.  Imaging (ultrasound or  CT urography): If hematuria persists or malignancy is suspected.  Cystoscopy: If hematuria is unexplained or risk factors for malignancy are  present. Summary: The most likely cause is uncomplicated cystitis, which is common and benign, but persistent hematuria or risk factors warrant evaluation for malignancy. Would you like me to summarize the current guidelines on the evaluation of microscopic hematuria in women, particularly regarding when to initiate imaging and cystoscopy to rule out malignancy? This could help clarify the threshold for further workup beyond benign causes.    Initial management in primary care for a woman in her 35s with microscopic hematuria and urge urinary incontinence, no smoking history, and no evidence of infection or menstruation, should begin with a thorough history, physical examination (including pelvic exam), and laboratory assessment to confirm symptoms and exclude other causes of hematuria and incontinence.[1][2][3] This includes documenting the duration and severity of symptoms, performing a brief neurological exam, and ensuring a proper hematuria workup.  For patients at low risk for malignancy (age <50, no smoking history, 3-10 RBC/HPF), it is reasonable to repeat urinalysis in six months while awaiting urology evaluation, rather than proceeding immediately to imaging or cystoscopy.[4] Persistent hematuria or new risk factors would warrant further evaluation.  Conservative management for urge urinary incontinence should be initiated while awaiting specialist input. This includes pelvic floor muscle exercises, bladder training (timed voiding), fluid management (avoiding excessive intake and bladder irritants), and weight loss if overweight.[5][6] These interventions are low risk and can improve symptoms of urgency incontinence, regardless of the underlying diagnosis.  Empiric therapy specifically for interstitial cystitis (e.g., medications or invasive procedures) is not recommended in primary care prior to urology consultation, as diagnosis requires exclusion of other causes and specialist  input.[1]  Table 1 from the JAMA review summarizes landmark clinical trials supporting the effectiveness of behavioral and lifestyle interventions for urgency urinary incontinence, which are appropriate first-line measures in this scenario.  Table 1 [facility] Clinical Trials in Urgency Urinary Incontinence (UUI) Treatment Orlando ES, Santiago-Lastra Y, Albo ME, Brubaker L. Urinary Incontinence in Women: A Review. Jama. 2017;318(16):1592-1604. doi:10.1001/jama.7982.87862.    Would you like me to summarize the latest evidence on the diagnostic criteria and non-invasive assessment tools for interstitial cystitis, so you can ensure appropriate exclusion of other causes while awaiting urology consultation? References Diagnosis and Treatment of Interstitial Cystitis/Bladder Pain Syndrome. Roark NORRIS Delton DR, Valerie NP, Awanda CHEADLE. The Journal of Urology. 2022;208(1):34-42. doi:10.1097/JU.0000000000002756. Updates to Microhematuria: AUA/SUFU Guideline (2025). Barocas DA, Bartley GRADE, Matulewicz RS, et al. The Journal of Urology. 2025;213(5):547-557. doi:10.1097/JU.0000000000004490. Hematuria in Adults. Isaias RADDLE. The [place] Journal of Medicine. 2021;385(2):153-163. doi:10.1056/NEJMra1604481. Microscopic Hematuria in Adults: Updated Recommendations from the American Urological Association. American Academy of Family Physicians [phone]). Urinary Incontinence in Women: Evaluation and Management. Grayson HARLAND Carmel EF. American Family Physician. 2019;100(6):339-348. Urinary Incontinence in Women: A Review. Orlando ES, Santiago-Lastra Y, Albo ME, Brubaker L. JAMA. 2017;318(16):1592-1604. doi:10.1001/jama.7982.87862.   Minimally invasive oral and maxillofacial surgery procedures, such as TMJ arthrocentesis and arthroscopy, may offer benefit for patients with temporomandibular joint disorder (TMD) and associated tinnitus, but these are generally reserved for refractory cases after failure of conservative management.  Evidence supports that multidisciplinary approaches--including orofacial physical therapy, manual therapy, and occlusal splints--can reduce both tinnitus severity and jaw tightness in patients with TMD, and these are considered first-line interventions.[1][2][3][4]  TMJ arthrocentesis and arthroscopy have demonstrated improvement in TMD symptoms and some otologic complaints, including tinnitus, particularly in patients with persistent or severe dysfunction.[5][6][7] Arthrocentesis, for example, has been shown  to significantly reduce pain, improve jaw function, and resolve tinnitus in a subset of patients with TMJ closed lock.[5] Open joint surgery or total joint replacement is reserved for cases with severely limited function or structural derangement.[6]  Conservative therapies--such as counseling, exercise, and occlusal splints--are recommended before considering surgical intervention, as the evidence for surgery specifically targeting tinnitus remains limited and inconsistent.[3][8][9][10] Most patients experience improvement with noninvasive therapies, and surgical referral is appropriate only for those with persistent, severe symptoms unresponsive to conservative measures.[8][9]  Current research does not provide high-certainty evidence or standardized protocols for surgical correction of tinnitus in TMD patients, and further studies are needed to clarify which interventions are most effective for chronic cases.[1][3] Multidisciplinary care remains essential for optimal management.   Would you like me to review the latest clinical studies comparing outcomes of TMJ arthrocentesis and arthroscopy specifically for patients with TMD-associated tinnitus, to clarify their efficacy and patient selection criteria? References Tinnitus (2024). [employer], Jenifer Beck AuD, Amy Boudin-George AuD CCC-A, et al. Department of [employer]. Effectiveness of Physical Therapy Interventions for  Temporomandibular Disorders Associated With Tinnitus: A Systematic Review. [place] MT, Silva C, Silva J, et al. Journal of Clinical Medicine. 2023;12(13):4329. doi:10.3390/jcm12134329. The Association Between Temporomandibular Disorders and Tinnitus: Evidence and Therapeutic Perspectives From a Systematic Review. Dipalma G, Inchingolo AD, Pezzolla C, et al. Journal of Clinical Medicine. 2025;14(3):881. doi:10.3390/jcm14030881. Tinnitus in Patients With Temporo-Mandibular Joint Disorder: Proposal for a New Treatment Protocol. Attanasio G, Leonardi A, Arangio P, et al. Journal of Cranio-Maxillo-Facial Surgery : Official Publication of the European Association for Cranio-Maxillo-Facial Surgery. 2015;43(5):724-7. doi:10.1016/j.jcms.2015.02.009. Outcome of Otologic Symptoms After Temporomandibular Joint Arthrocentesis. Tozoglu S, Bayramoglu Z, Ozkan O. The Journal of Craniofacial Surgery. 2015;26(4):e344-7. doi:10.1097/SCS.0000000000001808. Temporomandibular Disorders: Surgical Implications and Management. Henein P, Ziccardi VB. [facility] [place]. 2023;67(2):349-365. doi:10.1016/j.cden.2022.12.002. Arthroscopic Management and Recent Advancements in the Treatment of Temporomandibular Joint Disorders. Asenath MALVA Moats CM, Hal MORTON The [place] Journal of Oral & Maxillofacial Surgery. 2024;62(9):820-825. doi:10.1016/j.bjoms.2024.07.007. Temporomandibular Disorders: Rapid Evidence Review. Matheson EM, Fermo JD, Blackwelder RS. American Family Physician. 2023;107(1):52-58. Diagnosis and Treatment of Temporomandibular Disorders. Gauer RL, Semidey MJ. American Family Physician. 2015;91(6):378-86. Effect of Temporomandibular Disorder Therapy on Otologic Signs and Symptoms: A Systematic Review. Stechman-Neto J, Porporatti AL, [place] I, et al. Journal of Oral Rehabilitation. 2016;43(6):468-79. doi:10.1111/joor.87619.   Occlusal Splint Instructions  What is an Occlusal Splint?  An occlusal  splint is a custom-made mouth guard that fits over your teeth. It is used to help relax your jaw muscles, protect your teeth from grinding, and reduce pain or tightness in your jaw. For some people with temporomandibular joint disorder (TMD), it may also help with symptoms like tinnitus (ringing in the ears).[1][2][3][4][5][6][7][8]  How to Use Your Occlusal Splint  - Wear your splint as directed by your dentist or doctor. Most people wear it at night while sleeping, but some may be asked to wear it during the day for a few hours.  - Before putting in your splint, brush your teeth and rinse the splint with water.  - Place the splint over your upper or lower teeth (as instructed). Make sure it fits snugly and feels comfortable.  - Do not eat or drink (except water) while wearing the splint.  - Remove the splint in the morning, rinse it with water, and brush it gently with a toothbrush. Let it air dry before storing it in its case.  Tips for Success  - Use the splint every day as  recommended, even if your symptoms improve.  - If you feel pain, discomfort, or notice changes in your bite, contact your dentist.  - Clean your splint regularly to prevent bacteria buildup.  - Avoid exposing the splint to heat, which can change its shape.  What to Expect  Many people notice less jaw pain, tightness, and fewer headaches after using an occlusal splint for several weeks. Some also report improvement in tinnitus symptoms. However, results can vary, and splints work best when combined with other treatments like physical therapy, relaxation exercises, and good sleep habits.[2][3][4][5][6][7][8][9]  When to Call Your Dentist  - If your splint feels loose, cracked, or uncomfortable.  - If you have new or worsening pain.  - If your tinnitus gets worse or you notice other ear symptoms.  Remember: An occlusal splint is one part of managing TMD and tinnitus. Follow your care team's advice and keep up  with any other recommended treatments for the best results.[1][9][2][3][4][5][6][7][8] References Occlusal Interventions for Managing Temporomandibular Disorders. Singh BP, Dennise LOISE Audry GORMAN, et al. The Cochrane Database of Systematic Reviews. 2024;9:CD012850. doi:10.1002/14651858.[id number].ela7. Efficacy of Rehabilitative Therapies on Otologic Symptoms in Patients With Temporomandibular Disorders: A Systematic Review of Randomised Controlled Trials. Ferrillo M, Marotta N, Viola P, et al. Journal of Oral Rehabilitation. 2024;51(8):1621-1631. doi:10.1111/joor.86283. Effect of Temporomandibular Disorder Therapy on Otologic Signs and Symptoms: A Systematic Review. Stechman-Neto J, Porporatti AL, [place] I, et al. Journal of Oral Rehabilitation. 2016;43(6):468-79. doi:10.1111/joor.87619. Tinnitus in Patients With Temporo-Mandibular Joint Disorder: Proposal for a New Treatment Protocol. Attanasio G, Leonardi A, Arangio P, et al. Journal of Cranio-Maxillo-Facial Surgery : Official Publication of the European Association for Cranio-Maxillo-Facial Surgery. 2015;43(5):724-7. doi:10.1016/j.jcms.2015.02.009. Effectiveness of Occlusal Splint Therapy in the Management of Temporomandibular Disorders: Network Meta-Analysis of Randomized Controlled Trials. Al-Moraissi EA, Farea R, Qasem KA, et al. International Journal of Oral and Maxillofacial Surgery. 2020;49(8):1042-1056. doi:10.1016/j.ijom.2020.01.004. Efficacy of Occlusal Splints in the Treatment of Temporomandibular Disorders: A Systematic Review of Randomized Controlled Trials. Laurita BUNTING, He KX, Melia CANAVAN, et al. Acta [employer]. 2020;78(8):580-589. doi:10.1080/[id number]. Occlusal Stabilization Splint for Patients With Temporomandibular Disorders: Meta-Analysis of Short and Long Term Effects. Kuzmanovic Pficer J, Dodic S, Lazic V, et al. PloS One. 2017;12(2):e0171296. doi:10.1371/journal.pone.9828703. The Efficacy of Treatments for  Temporomandibular Disorders With Occlusal Splints Versus Other Conservative Therapies: A Meta-Analysis of Randomized Controlled Trials. Laurita CINDERELLA Laurita VEAR Arminda JONELLE, et al. Oral Surgery, Oral Medicine, Oral Pathology and Oral Radiology. 2025;139(5):509-520. doi:10.1016/j.oooo.2024.11.089. Tinnitus (2024). [employer], Jenifer Beck AuD, Amy Boudin-George AuD CCC-A, et al. Department of [employer].

## 2024-06-24 NOTE — Progress Notes (Unsigned)
 [facility]  Phone: [phone]  - Medical Office Visit -  Visit Date: 06/24/2024 Patient: Christy Alexander   DOB: 1981/07/09   43 y.o. Female  MRN: [id number] Patient Care Team: Jesus Bernardino MATSU, MD as PCP - General (Internal Medicine) Verlin Lonni BIRCH, MD as PCP - Cardiology (Cardiology) Carlie Clark, MD as Consulting Physician (Otolaryngology) Livingston Rigg, MD as Consulting Physician (Dermatology) Duke, Jon Garre, [place] as Physician Assistant (Cardiology) Today's Health Care Provider: [name], MD  ===========================================    Chief Complaint / Reason for Visit: New pt (Pt is present to est care with pcp would like cpe concerned about being diabetes ) and Annual Exam (Pt would like cpe with labs today would like to check her for diabetes she has been drinking lot just staying trusty )  Background: 43 y.o. female who has Drug allergy; Idiopathic urticaria; Allergic rhinitis due to pollen; Arthritis; AP (abdominal pain); Dermographia; Double vision; Paresthesia; Anxiety; Chronic constipation; Elevated C-reactive protein; Fissure in skin; H/O partial thyroidectomy; Hyperlipidemia; Intervertebral disc disorder; Laryngopharyngeal reflux; Degenerative disc disease, lumbar; Microhematuria; Obesity; Referred otalgia of both ears; Arthralgia of multiple joints; Urge incontinence; Vitamin D  deficiency; Arthralgia of left knee; Elevated LDL cholesterol level; Elevated hemoglobin A1c; Elevated sed rate; Herniated nucleus pulposus, L4-5 right; Low back pain; Recurrent sinusitis; Shoulder pain; Steatosis of liver; Tinnitus of left ear; Seasonal allergies; and Temporomandibular joint (TMJ) pain on their problem list.  Discussed the use of AI scribe software for clinical note transcription with the patient, who gave verbal consent to proceed.  History of Present Illness 43 year old female who presents with chest discomfort and urinary  symptoms.  She experiences chest discomfort that has worsened over time, initially attributed to anxiety. The pain is now more frequent, occurring both at rest and during activity, and is associated with a migraine-like sensation radiating to her teeth and jaw. Her blood pressure spikes to 140/90 mmHg during these episodes. Previous echocardiograms and a cardiac CT were normal.  She has a history of back surgery in 2006 for a herniated L4 disc, which re-herniated a few years ago. She underwent a microdiscectomy and laminectomy but has not had further surgery due to concerns about operating on a previously operated field. She continues to experience back pain.  Chronic pelvic pain is present, with no official diagnosis of endometriosis. She experiences random sharp pains not related to pressure or her menstrual cycle, as she is on birth control and does not have periods. She has a history of abnormal pap smears and has undergone colposcopy.  She reports urinary urgency and frequency, with a history of microscopic hematuria. A previous cystoscopy was inconclusive due to equipment malfunction.  She has a history of elevated A1c levels, indicating prediabetes, and experiences frequent urination, dry mouth, and increased thirst, particularly at night.  She has a history of allergic rhinitis and recurrent sinusitis, with ongoing sinus issues without drainage. She experiences tinnitus, which she notices in quiet environments, and associates it with her TMJ disorder. She reports tightness in her jaw muscles.  She has a BMI of 40 and has been discussing weight loss options, including seeing a nutritionist. She has made lifestyle changes such as stopping soda consumption and is attempting to increase physical activity.  She has a history of thyroid  surgery with removal of the right lobe due to a benign nodule. She is not on thyroid  medication but is interested in monitoring her calcium  and thyroid   levels.  She reports a  history of elevated sed rate and CRP, which have been monitored by her previous PCP, but no definitive cause has been identified.  She experiences joint pain, particularly in her left knee following a fall, and has been seen by orthopedics for this issue.  Problem overviews updated today: Problem  Tinnitus of Left Ear  Recurrent Sinusitis  Arthralgia of Left Knee  Elevated Ldl Cholesterol Level  Steatosis of Liver   U/S abd 04/21/23   Herniated Nucleus Pulposus, L4-5 Right  Low Back Pain   Surgery age 59 for herniated disc   Shoulder Pain   Bilateral and intermittent   Urge Incontinence   Thinks just tiny bladder.  History microscopic hematuria urinary urgency/frequency.   Temporomandibular Joint (Tmj) Pain  Vitamin D  Deficiency   Lab Results  Component Value Date   VD25OH 31.3 06/04/2019      Elevated Hemoglobin A1c  Arthralgia of Multiple Joints  Laryngopharyngeal Reflux  Obesity  Seasonal Allergies  Elevated Sed Rate  Degenerative Disc Disease, Lumbar  Abnormal Cervical Papanicolaou Smear (Resolved)   2007 COLPO/ CRYO   History of Thyroid  Nodule (Resolved)  Allergic Rhinitis (Resolved)  Prediabetes (Resolved)  Pain in Joint of Left Shoulder (Resolved)  Abnormal Menstrual Periods (Resolved)  Dysphonia (Resolved)  Right Thyroid  Nodule (Resolved)  Thyroid  Nodule (Resolved)   Right lobe FNA negative. Followed by ENT S/P right thyroidectomy 5/19, U/S 12/04/19 negative no nodules, 02/05/21 negative/no nodules     Medications updated/reviewed: Current Outpatient Medications on File Prior to Visit  Medication Sig   amLODipine  (NORVASC ) 2.5 MG tablet Take 1 tablet (2.5 mg total) by mouth daily.   Ascorbic Acid (VITAMIN C) 1000 MG tablet Take 1,000 mg by mouth at bedtime.   azelastine  (ASTELIN ) 0.1 % nasal spray Place 1 spray into both nostrils 2 (two) times daily. Use in each nostril as directed   cetirizine (ZYRTEC) 10 MG tablet Take 10 mg  by mouth at bedtime.    diphenhydrAMINE  (BENADRYL ) 2 % cream Apply 1 application topically 3 (three) times daily as needed (for hives.).   diphenhydrAMINE  (BENADRYL ) 25 mg capsule Take 25-50 mg by mouth every 6 (six) hours as needed (for hives.).    metFORMIN  (GLUCOPHAGE -XR) 500 MG 24 hr tablet TAKE 1 TABLET BY MOUTH ONCE DAILY   Norethin  Ace-Eth Estrad-FE (MINASTRIN 24 FE) 1-20 MG-MCG(24) CHEW Chew 1 tablet by mouth daily.   nystatin  (MYCOSTATIN /NYSTOP ) powder APPLY TO THE AFFECTED AREA(S) 2 TIMES PER DAY AS DIRECTED.   omeprazole  (PRILOSEC) 40 MG capsule Take 1 capsule (40 mg total) by mouth 2 (two) times daily.   VITAMIN D  PO Take 2,000 Units by mouth daily.   albuterol  (VENTOLIN  HFA) 108 (90 Base) MCG/ACT inhaler Inhale 2 puffs into the lungs every 6 (six) hours as needed for wheezing or shortness of breath. (Patient not taking: Reported on 06/24/2024)   budesonide -formoterol  (SYMBICORT ) 160-4.5 MCG/ACT inhaler Inhale 2 puffs into the lungs 2 (two) times daily. (Patient not taking: Reported on 06/24/2024)   fluticasone  (FLONASE ) 50 MCG/ACT nasal spray Place 2 sprays into both nostrils daily. (Patient not taking: Reported on 06/24/2024)   meloxicam  (MOBIC ) 7.5 MG tablet Take 1 tablet (7.5 mg) by mouth daily. (Patient taking differently: Take 7.5 mg by mouth as needed.)   norethindrone -ethinyl estradiol -FE (JUNEL  FE 1/20) 1-20 MG-MCG tablet Take 1 tablet by mouth daily.   No current facility-administered medications on file prior to visit.  There are no discontinued medications. Current Meds  Medication Sig   amLODipine  (NORVASC ) 2.5 MG  tablet Take 1 tablet (2.5 mg total) by mouth daily.   Ascorbic Acid (VITAMIN C) 1000 MG tablet Take 1,000 mg by mouth at bedtime.   azelastine  (ASTELIN ) 0.1 % nasal spray Place 1 spray into both nostrils 2 (two) times daily. Use in each nostril as directed   cetirizine (ZYRTEC) 10 MG tablet Take 10 mg by mouth at bedtime.    diphenhydrAMINE  (BENADRYL ) 2 % cream  Apply 1 application topically 3 (three) times daily as needed (for hives.).   diphenhydrAMINE  (BENADRYL ) 25 mg capsule Take 25-50 mg by mouth every 6 (six) hours as needed (for hives.).    metFORMIN  (GLUCOPHAGE -XR) 500 MG 24 hr tablet TAKE 1 TABLET BY MOUTH ONCE DAILY   Norethin  Ace-Eth Estrad-FE (MINASTRIN 24 FE) 1-20 MG-MCG(24) CHEW Chew 1 tablet by mouth daily.   nystatin  (MYCOSTATIN /NYSTOP ) powder APPLY TO THE AFFECTED AREA(S) 2 TIMES PER DAY AS DIRECTED.   omeprazole  (PRILOSEC) 40 MG capsule Take 1 capsule (40 mg total) by mouth 2 (two) times daily.   tirzepatide (ZEPBOUND) 2.5 MG/0.5ML Pen Inject 2.5 mg into the skin once a week.   VITAMIN D  PO Take 2,000 Units by mouth daily.    Allergies:  Aspirin, Chlorhexidine , Esomeprazole, Nsaids, Chlorhexidine  gluconate, Esomeprazole magnesium, and Iodinated contrast media Past Medical History:  has a past medical history of Abnormal cervical Papanicolaou smear (06/24/2024), Abnormal menstrual periods (01/17/2022), Allergy, Anxiety, Back pain at L4-L5 level, Blood transfusion without reported diagnosis (04/2005), BMI 30.0-30.9,adult, Chest pain, Constipation, Delayed pressure urticaria (dx age 64), Depression, Dysphonia (07/03/2018), GERD (gastroesophageal reflux disease), Hyperglycemia, Hyperlipidemia, Laryngopharyngeal reflux, Lower back pain, Obesity, Pain in joint of left shoulder (02/10/2022), Right thyroid  nodule, Seasonal allergies, Thyroid  nodule (05/10/2016), TMJ (dislocation of temporomandibular joint), Urticaria, and Vitamin D  deficiency. Past Surgical History:   has a past surgical history that includes Lumbar laminectomy (2006); Wisdom tooth extraction (2001); Thyroid  lobectomy (Right, 02/01/2018); and Spine surgery (04/2005). Social History:   reports that she has never smoked. She has never been exposed to tobacco smoke. She has never used smokeless tobacco. She reports that she does not drink alcohol and does not use drugs. Family History:   family history includes Anxiety disorder in her brother, brother, and mother; Cancer in her father; Cataracts in her brother; Colon cancer in her paternal grandmother; Depression in her brother, brother, and mother; Diabetes in her brother, mother, and paternal grandfather; Diabetes type II in her brother and mother; Glaucoma in her father; Hearing loss in her mother; Heart attack in her maternal grandmother; Heart disease in her father, maternal grandmother, mother, and paternal grandfather; Hyperlipidemia in her brother, father, and mother; Hypertension in her brother, brother, father, maternal grandmother, mother, and paternal grandfather; Kidney disease in her mother; Kidney failure in her mother; Obesity in her brother, father, and mother; Other in her maternal grandfather; Sleep apnea in her mother; Tongue cancer in her father; Varicose Veins in her mother. Depression Screen and Health Maintenance:    06/24/2024    1:23 PM  PHQ 2/9 Scores  PHQ - 2 Score 2  PHQ- 9 Score 5   Health Maintenance  Topic Date Due   Pneumococcal Vaccine (1 of 2 - PCV) Never done   Hepatitis B Vaccines 19-59 Average Risk (1 of 3 - 19+ 3-dose series) Never done   HPV VACCINES (1 - 3-dose SCDM series) Never done   Mammogram  09/12/2024 (Originally 07/27/2022)   Cervical Cancer Screening (HPV/Pap Cotest)  09/18/2024 (Originally 07/27/2012)   Hepatitis C Screening  06/24/2025 (  Originally 07/27/2000)   DTaP/Tdap/Td (4 - Td or Tdap) 02/03/2025   Influenza Vaccine  Completed   COVID-19 Vaccine  Completed   HIV Screening  Completed   Meningococcal B Vaccine  Aged Out   Immunization History  Administered Date(s) Administered   Influenza,inj,quad, With Preservative 07/10/2018, 07/01/2019   Influenza-Unspecified 06/24/2015, 06/23/2016, 06/26/2017, 07/09/2018, 07/01/2019, 06/30/2020   PFIZER(Purple Top)SARS-COV-2 Vaccination 11/06/2019, 11/28/2019, 09/08/2020   Pfizer Covid-19 Vaccine Bivalent Booster 44yrs & up 09/09/2021    Pfizer(Comirnaty)Fall Seasonal Vaccine 12 years and older 07/01/2022, 07/03/2023   Td 02/04/2015   Tdap 02/04/2015   Tetanus 02/04/2015     Objective   Physical ExamBP 122/88   Pulse 86   Temp 98 F (36.7 C) (Temporal)   Ht 5' 4 (1.626 m)   Wt 238 lb 12.8 oz (108.3 kg)   SpO2 98%   BMI 40.99 kg/m  Wt Readings from Last 10 Encounters:  06/24/24 238 lb 12.8 oz (108.3 kg)  06/24/24 239 lb 6.4 oz (108.6 kg)  05/23/24 243 lb (110.2 kg)  04/12/24 239 lb (108.4 kg)  07/13/23 233 lb (105.7 kg)  06/27/23 237 lb 6.4 oz (107.7 kg)  04/25/23 240 lb (108.9 kg)  06/24/22 230 lb 9.6 oz (104.6 kg)  05/12/22 223 lb 9.6 oz (101.4 kg)  11/11/20 224 lb (101.6 kg)  Vital signs reviewed.  Nursing notes reviewed. Weight trend reviewed. General Appearance:  Well developed, well nourished, well-groomed, healthy-appearing female with Body mass index is 40.99 kg/m. No acute distress appreciable.   Skin: Clear and well-hydrated. Pulmonary:  Normal work of breathing at rest, no respiratory distress apparent. SpO2: 98 %  Musculoskeletal: She demonstrates smooth and coordinated movements throughout all major joints.All extremities are intact.  Neurological:  Awake, alert, oriented, and engaged.  No obvious focal neurological deficits or cognitive impairments.  Sensorium seems unclouded.  Psychiatric:  Appropriate mood, pleasant and cooperative demeanor, cheerful and engaged during the exam  Reviewed Results & Data Results RADIOLOGY Cardiac CT: Normal  DIAGNOSTIC REPORTS Echocardiogram: Normal  No results found for any visits on 06/24/24.  Hospital Outpatient Visit on 05/16/2024  Component Date Value   Area-P 1/2 05/16/2024 4.36    S' Lateral 05/16/2024 2.70    Est EF 05/16/2024 55 - 60%   Admission on 07/13/2023, Discharged on 07/13/2023  Component Date Value   Neisseria Gonorrhea 07/13/2023 Negative    Chlamydia 07/13/2023 Negative    Comment 07/13/2023 Normal Reference Ranger Chlamydia -  Negative    Comment 07/13/2023 Normal Reference Range Neisseria Gonorrhea - Negative    RPR Ser Ql 07/13/2023 NON REACTIVE    Color, Urine 07/13/2023 YELLOW    APPearance 07/13/2023 HAZY (A)    Specific Gravity, Urine 07/13/2023 1.031 (H)    pH 07/13/2023 6.0    Glucose, UA 07/13/2023 NEGATIVE    Hgb urine dipstick 07/13/2023 LARGE (A)    Bilirubin Urine 07/13/2023 NEGATIVE    Ketones, ur 07/13/2023 5 (A)    Protein, ur 07/13/2023 30 (A)    Nitrite 07/13/2023 NEGATIVE    Leukocytes,Ua 07/13/2023 NEGATIVE    RBC / HPF 07/13/2023 >50    WBC, UA 07/13/2023 0-5    Bacteria, UA 07/13/2023 RARE (A)    Squamous Epithelial / HPF 07/13/2023 0-5    Mucus 07/13/2023 PRESENT    Yeast Wet Prep HPF POC 07/13/2023 NONE SEEN    Trich, Wet Prep 07/13/2023 NONE SEEN    Clue Cells Wet Prep HPF * 07/13/2023 NONE SEEN    WBC, Wet  Prep HPF POC 07/13/2023 <10    Sperm 07/13/2023 NONE SEEN    Preg, Serum 07/13/2023 NEGATIVE    Sodium 07/13/2023 134 (L)    Potassium 07/13/2023 3.7    Chloride 07/13/2023 102    CO2 07/13/2023 22    Glucose, Bld 07/13/2023 100 (H)    BUN 07/13/2023 14    Creatinine, Ser 07/13/2023 0.65    Calcium  07/13/2023 8.7 (L)    Total Protein 07/13/2023 7.0    Albumin 07/13/2023 3.6    AST 07/13/2023 30    ALT 07/13/2023 37    Alkaline Phosphatase 07/13/2023 48    Total Bilirubin 07/13/2023 0.4    GFR, Estimated 07/13/2023 >60    Anion gap 07/13/2023 10    HIV Screen 4th Generatio* 07/13/2023 Non Reactive    Glucose-Capillary 07/13/2023 78   Office Visit on 06/27/2023  Component Date Value   Sed Rate 06/27/2023 28 (H)    CRP 06/27/2023 30.7 (H)    No image results found.   DG Chest 2 View Result Date: 05/30/2024 CLINICAL DATA:  Chest discomfort EXAM: CHEST - 2 VIEW COMPARISON:  11/28/2023 FINDINGS: The heart size and mediastinal contours are within normal limits. Both lungs are clear. The visualized skeletal structures are unremarkable. IMPRESSION: No active  cardiopulmonary disease. Electronically Signed   By: Luke Bun M.D.   On: 05/30/2024 23:22   ECHOCARDIOGRAM COMPLETE Result Date: 05/16/2024    ECHOCARDIOGRAM REPORT   Patient Name:   [facility] Alcindor  Date of Exam: 05/16/2024 Medical Rec #:  [id number]     Height:       64.0 in Accession #:    [id number]    Weight:       239.0 lb Date of Birth:  07-31-1981     BSA:          2.110 m Patient Age:    42 years      BP:           128/100 mmHg Patient Gender: F             HR:           80 bpm. Exam Location:  [address] Procedure: 2D Echo, Cardiac Doppler, Color Doppler and Intracardiac            Opacification Agent (Both Spectral and Color Flow Doppler were            utilized during procedure). Indications:    R07.9 Chest pain  History:        Patient has prior history of Echocardiogram examinations, most                 recent 05/27/2022. Signs/Symptoms:Chest Pain.  Sonographer:    Carl Coma RDCS Referring Phys: [id number] ANGELA NICOLE DUKE IMPRESSIONS  1. Left ventricular ejection fraction, by estimation, is 55 to 60%. The left ventricle has normal function. The left ventricle has no regional wall motion abnormalities. Left ventricular diastolic parameters were normal.  2. Right ventricular systolic function is normal. The right ventricular size is normal. Tricuspid regurgitation signal is inadequate for assessing PA pressure.  3. The mitral valve is normal in structure. Trivial mitral valve regurgitation. No evidence of mitral stenosis.  4. The aortic valve is tricuspid. Aortic valve regurgitation is not visualized. Aortic valve sclerosis/calcification is present, without any evidence of aortic stenosis.  5. The inferior vena cava is normal in size with greater than 50% respiratory variability, suggesting right atrial pressure of 3 mmHg. FINDINGS  Left Ventricle: Left ventricular ejection fraction, by estimation, is 55 to 60%. The left ventricle has normal function. The left ventricle has no regional  wall motion abnormalities. Definity  contrast agent was given IV to delineate the left ventricular  endocardial borders. The left ventricular internal cavity size was normal in size. There is no left ventricular hypertrophy. Left ventricular diastolic parameters were normal. Normal left ventricular filling pressure. Right Ventricle: The right ventricular size is normal. No increase in right ventricular wall thickness. Right ventricular systolic function is normal. Tricuspid regurgitation signal is inadequate for assessing PA pressure. Left Atrium: Left atrial size was normal in size. Right Atrium: Right atrial size was normal in size. Pericardium: There is no evidence of pericardial effusion. Mitral Valve: The mitral valve is normal in structure. Trivial mitral valve regurgitation. No evidence of mitral valve stenosis. Tricuspid Valve: The tricuspid valve is normal in structure. Tricuspid valve regurgitation is trivial. No evidence of tricuspid stenosis. Aortic Valve: The aortic valve is tricuspid. Aortic valve regurgitation is not visualized. Aortic valve sclerosis/calcification is present, without any evidence of aortic stenosis. Pulmonic Valve: The pulmonic valve was normal in structure. Pulmonic valve regurgitation is not visualized. No evidence of pulmonic stenosis. Aorta: The aortic root is normal in size and structure. Venous: The inferior vena cava is normal in size with greater than 50% respiratory variability, suggesting right atrial pressure of 3 mmHg. IAS/Shunts: No atrial level shunt detected by color flow Doppler.  LEFT VENTRICLE PLAX 2D LVIDd:         4.60 cm   Diastology LVIDs:         2.70 cm   LV e' medial:    9.78 cm/s LV PW:         0.80 cm   LV E/e' medial:  10.3 LV IVS:        0.90 cm   LV e' lateral:   14.97 cm/s LVOT diam:     1.90 cm   LV E/e' lateral: 6.7 LV SV:         56 LV SV Index:   27 LVOT Area:     2.84 cm  RIGHT VENTRICLE             IVC RV Basal diam:  3.60 cm     IVC diam: 0.90 cm  RV S prime:     11.20 cm/s TAPSE (M-mode): 1.7 cm LEFT ATRIUM             Index        RIGHT ATRIUM          Index LA diam:        4.30 cm 2.04 cm/m   RA Area:     8.65 cm LA Vol (A2C):   24.7 ml 11.70 ml/m  RA Volume:   19.00 ml 9.00 ml/m LA Vol (A4C):   41.8 ml 19.81 ml/m LA Biplane Vol: 33.7 ml 15.97 ml/m  AORTIC VALVE LVOT Vmax:   97.90 cm/s LVOT Vmean:  65.000 cm/s LVOT VTI:    0.198 m  AORTA Ao Root diam: 3.10 cm Ao Asc diam:  2.90 cm MITRAL VALVE MV Area (PHT): 4.36 cm     SHUNTS MV Decel Time: 174 msec     Systemic VTI:  0.20 m MV E velocity: 101.00 cm/s  Systemic Diam: 1.90 cm MV A velocity: 68.10 cm/s MV E/A ratio:  1.48 Wilbert Bihari MD Electronically signed by Wilbert Bihari MD Signature Date/Time: 05/16/2024/8:52:40 AM    Final  ASSESSMENT & PLAN   Assessment & Plan NAFLD (nonalcoholic fatty liver disease) Morbid obesity (HCC) Steatosis of liver Obesity with BMI of 40 and associated fatty liver disease. Discussed Zepbound for weight loss, noting potential for significant weight loss compared to Saxenda. Addressed set point theory and challenges of maintaining weight loss post-medication. Zepbound offers a 25% weight loss compared to 5% with Saxenda. Emphasized need for permanent lifestyle changes to prevent post-medication weight gain. Submit test claim for Zepbound to insurance, encourage dietary changes and increased physical activity, and schedule follow-up with nutritionist. H/O partial thyroidectomy History of right thyroid  lobectomy for benign nodule. No current thyroid  medication. Desires verification of calcium  and thyroid  function. Order parathyroid hormone, TSH, and free T4 as part of general health panel. Vitamin D  deficiency Vitamin D  deficiency, previously borderline corrected. Order vitamin D  level as part of general health panel. Polydipsia Shared decision-making done; patient understood rationale and agreed to labwork  Polyuria Microscopic hematuria Urge  incontinence Chronic pelvic pain with urinary urgency, frequency, and microscopic hematuria (suspected interstitial cystitis)   Chronic pelvic pain with urinary urgency, frequency, and microscopic hematuria suggests interstitial cystitis. Differential includes urinary tract infection and overactive bladder. Bladder cancer and renal cell carcinoma are unlikely due to non-smoking history. Bladder dilation and Myrbetriq were considered, but insurance coverage is a concern. Order urinalysis and microscopy, refer to urology, discuss pelvic floor training exercises and bladder timing, and consider Myrbetriq if insurance allows. 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 Discussed potential for occlusal splint to manage symptoms. Previous dental trays were unsuccessful due to fitting issues. Provide referral for occlusal splint fitting and prescribe Percocet for procedural pain management if needed. Pain associated with surgical procedure Wrote for percocet because last time she tried dental occlusal splint molds it was too painful, I arranged for her to have perioperative pain management. Elevated sed rate Shared decision-making done; patient understood rationale and agreed to labwork  Prediabetes Prediabetes with recent symptoms suggestive of possible progression to diabetes, including frequent urination and dry mouth at night. Order hemoglobin A1c as part of general health panel. Hypertension, unspecified type Recent increase in diastolic blood pressure. Continue amlodipine  as prescribed by cardiologist and monitor blood pressure. Degeneration of intervertebral disc of lumbar region, unspecified whether pain present Chronic back pain with history of lumbar disc herniation and surgery. Re-herniation noted a few years ago, with previous surgery making further surgical intervention challenging. Laryngopharyngeal reflux (LPR) Laryngopharyngeal reflux affecting voice. Managed as part of  GERD spectrum. Recurrent sinusitis Recurrent sinusitis with recent episodes. Previous ENT evaluation did not indicate need for surgery.  ORDER ASSOCIATIONS  #   DIAGNOSIS / CONDITION ICD-10 ENCOUNTER ORDER     ICD-10-CM   1. NAFLD (nonalcoholic fatty liver disease)  K76.0 tirzepatide (ZEPBOUND) 2.5 MG/0.5ML Pen    2. H/O partial thyroidectomy  E89.0 PTH, intact (no Ca)    TSH + free T4    3. Chest discomfort  R07.89     4. Vitamin D  deficiency  E55.9 Vitamin D  (25 hydroxy)    PTH, intact (no Ca)    5. Polydipsia  R63.1     6. Polyuria  R35.89     7. Microscopic hematuria  R31.29     8. Urge incontinence  N39.41 Urinalysis w microscopic + reflex cultur    Ambulatory referral to Urology    9. Steatosis of liver  K76.0 Comprehensive metabolic panel with GFR    CBC with Differential/Platelet    tirzepatide (ZEPBOUND) 2.5 MG/0.5ML Pen  10. Temporomandibular disorder  M26.609 Splint    11. 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 She reports ongoing jaw tightness, pain, and significant impact on quality of life. Her tinnitus is somatosensory in nature, with modulation by jaw movement, and has not responded to prior conservative measures.  Clinical context and rationale:  There is robust evidence supporting a link between TMD and tinnitus, with  shared somatosensory and neuroplastic mechanisms. Multidisciplinary management-including dental, physical therapy, and otolaryngology input-is recommended for optimal outcomes. Occlusal splint therapy, particularly hard stabilization splints, has demonstrated efficacy in reducing TMD pain, improving mandibular function, and decreasing tinnitus severity in select patients. Manual therapy and exercise targeting the jaw and cervical spine may further improve otologic symptoms and jaw function.[1][2][3][4][5][6][7][8][9][10][11][12][13][14][15][16][17]  Referral request:  - Please evaluate for occlusal splint therapy, tailored to her chronic TMD and bruxism history.   - Consider collaboration with physical therapy for orofacial and cervico-mandibular manual therapy, as evidence suggests combined approaches yield superior outcomes for both TMD and tinnitus.[15][16][17][14][5][3]  - Monitor for improvement in pain, jaw function, and tinnitus severity, and adjust therapy as needed.  Key evidence-based points:  - Occlusal splints are non-invasive and may reduce pain, improve mouth opening, and decrease joint sounds and tinnitus in TMD patients.[6][7][8][9][10][11][12][13][14][5][2]  - Manual therapy and exercise can further reduce tinnitus severity and improve quality of life.[15][16][17][3]  - Multidisciplinary care is recommended for patients with TMD-associated tinnitus.[3][4][5][1]  Please contact me if further clinical details or coordination are needed. Thank you for your partnership in this patient's care. References 1. The Association Between Temporomandibular Disorders and Tinnitus: Evidence and Therapeutic Perspectives From a Systematic Review. Dipalma G, Inchingolo AD, Pezzolla C, et al. Journal of Clinical Medicine. 2025;14(3):881. doi:10.3390/jcm14030881. 2. Is There a Link Between Tinnitus and Temporomandibular Disorders?. Buergers R, Kleinjung T, Behr M, Vielsmeier V. The Journal of  Prosthetic Dentistry. 2014;111(3):222-7. doi:10.1016/j.prosdent.2013.10.001. 3. Tinnitus (2024). [employer], Jenifer Beck AuD, Amy Boudin-George AuD CCC-A, et al. Department of [employer]. 4. Clinical Practice Guideline for Management of Tinnitus: Recommendations From the [place]  [facility] Clinical Practice Guideline Work Group. Sherlock LP, Lerry PARAS, Boudin-George A, et al. JAMA Otolaryngology-- Head & Neck Surgery. 2025;151(5):513-520. doi:10.1001/jamaoto.2025.0052. 5. Treatment Approaches, Outcomes and Prognostic Indicators in Patients With Tinnitus and Temporomandibular Disorders Evaluated With DC/TMD: A Systematic Review and Meta-Analysis. Moshe DELENA Branda CHRISTELLA Caldwell JAYSON, et al. Journal of Oral Rehabilitation. 2025;52(2):230-242. doi:10.1111/joor.86203. 6. Occlusal Interventions for Managing Temporomandibular Disorders. Singh BP, Dennise LOISE Audry GORMAN, et al. The Cochrane Database of Systematic Reviews. 2024;9:CD012850. doi:10.1002/14651858.RI987149.ela7. 7. Efficacy  of Rehabilitative Therapies on Otologic Symptoms in Patients With Temporomandibular Disorders: A Systematic Review of Randomised Controlled Trials. Ferrillo M, Marotta N, Viola P, et al. Journal of Oral Rehabilitation. 2024;51(8):1621-1631. doi:10.1111/joor.86283. 8. Effect of Temporomandibular Disorder Therapy on Otologic Signs and Symptoms: A Systematic Review. Stechman-Neto J, Porporatti AL, [place] I, et al. Journal of Oral Rehabilitation. 2016;43(6):468-79. doi:10.1111/joor.87619. 9. Tinnitus in Patients With Temporo-Mandibular Joint Disorder: Proposal for a New Treatment Protocol. Attanasio G, Leonardi A, Arangio P, et al. Journal of Cranio-Maxillo-Facial Surgery : Official Publication of the European Association for Cranio-Maxillo-Facial Surgery. 2015;43(5):724-7. doi:10.1016/j.jcms.2015.02.009. 10. Effectiveness of Occlusal Splint Therapy in the Management of Temporomandibular Disorders: Network  Meta-Analysis of Randomized Controlled Trials. Al-Moraissi EA, Farea R, Qasem KA, et al. International Journal of Oral and Maxillofacial Surgery. 2020;49(8):1042-1056. doi:10.1016/j.ijom.2020.01.004. 11. Efficacy of Occlusal Splints in the Treatment of Temporomandibular Disorders: A Systematic Review of Randomized Controlled Trials. Laurita BUNTING, He KX, Melia CANAVAN, et al. Acta Mittie Root. 2020;78(8):580-589. doi:10.1080/00016357.2020.1759818. 12. Occlusal Stabilization Splint for Patients With Temporomandibular Disorders: Meta-Analysis of Short and Long Term Effects. Kuzmanovic Pficer J, Dodic S, Lazic V, et al. PloS One. 2017;12(2):e0171296. doi:10.1371/journal.pone.9828703. 13. The Efficacy of Treatments for Temporomandibular Disorders With Occlusal Splints Versus Other Conservative Therapies: A Meta-Analysis of Randomized Controlled Trials. Laurita CINDERELLA Laurita VEAR Arminda JONELLE, et al. Oral Surgery, Oral Medicine, Oral Pathology and Oral Radiology. 2025;139(5):509-520. doi:10.1016/j.oooo.2024.11.089. 14. Reduction of Somatic Tinnitus Severity Is Mediated by Improvement of Temporomandibular Disorders. fleeta der Wal A, Rosena GORMAN fleeta de Heyning P, et al. Otology & Neurotology : Official Publication of the [employer], American Neurotology Society [And] Optician, dispensing. 2022;43(3):e309-e315. doi:10.1097/MAO.0000000000003446. 15. Effectiveness of Manual Therapy and Exercise Therapy on Otological Symptoms of Individuals With Temporomandibular Disorders: A Systematic Review. Tavares LF, Calixtre LB, Pelai EB, et al. Disability and Rehabilitation. 2025;47(15):3794-3808. doi:10.1080/09638288.2024.2435525. 16. Effectiveness of Physical Therapy Interventions for Temporomandibular Disorders Associated With Tinnitus: A Systematic Review. da Ripplemead MT, Silva C, Silva J, et al. Journal of Clinical Medicine. 2023;12(13):4329. doi:10.3390/jcm12134329. 17. Effects of Cervico-Mandibular  Manual Therapy in Patients With Temporomandibular Pain Disorders and Associated Somatic Tinnitus: A Randomized Clinical Trial. Delgado de la Serna P, Plaza-Manzano G, Cleland J, et al. Pain Medicine [facility] [place] [place] 2020;21(3):613-624. doi:10.1093/pm/pnz278.   06/24/24 1421    tirzepatide (ZEPBOUND) 2.5 MG/0.5ML Pen  Weekly        06/24/24 1421    Vitamin D  (25 hydroxy)        06/24/24 1421    PTH, intact (no Ca)        06/24/24 1421    TSH + free T4        06/24/24 1421          Diagnoses and all orders for this visit: NAFLD (nonalcoholic fatty liver disease) -     tirzepatide (ZEPBOUND) 2.5 MG/0.5ML Pen; Inject 2.5 mg into the skin once a week. H/O partial thyroidectomy -     PTH, intact (no Ca) -     TSH + free T4 Chest discomfort Vitamin D  deficiency -     Vitamin D  (25 hydroxy) -     PTH, intact (no Ca) Polydipsia Polyuria Microscopic hematuria Urge incontinence -     Urinalysis w microscopic + reflex cultur -     Ambulatory referral to Urology Steatosis of liver -     Comprehensive metabolic panel with GFR -     CBC with Differential/Platelet -     tirzepatide (ZEPBOUND) 2.5 MG/0.5ML Pen; Inject 2.5 mg into  the skin once a week. Temporomandibular disorder -     Splint Morbid obesity (HCC) -     Lipid panel -     HgB A1c   Recommended follow up: No follow-ups on file. Future Appointments  Date Time Provider Department Center  07/04/2024  1:00 PM [facility]  07/24/2024  9:00 AM Iva Marty Saltness, MD [facility] None  07/25/2024  9:40 AM Mollie Nestor HERO, PA-C [facility]  07/31/2024 10:00 AM [facility] [address]  07/31/2024 11:00 AM Hunsucker, Donnice SAUNDERS, MD [facility] [address]  08/28/2024  8:00 AM [facility], Jon Garre, PA [facility]         Additional notes: This document was synthesized by artificial intelligence (Abridge) using HIPAA-compliant recording of the clinical interaction;   We discussed the  use of AI scribe software for clinical note transcription with the patient, who gave verbal consent to proceed.    Additional Info: This encounter employed state-of-the-art, real-time, collaborative documentation. The patient actively reviewed and assisted in updating their electronic medical record on a shared screen, ensuring transparency and facilitating joint problem-solving for the problem list, overview, and plan. This approach promotes accurate, informed care. The treatment plan was discussed and reviewed in detail, including medication safety, potential side effects, and all patient questions. We confirmed understanding and comfort with the plan. Follow-up instructions were established, including contacting the office for any concerns, returning if symptoms worsen, persist, or new symptoms develop, and precautions for potential emergency department visits.  Initial Appointment Goals:  This initial visit focused on establishing a foundation for the patient's care. We collaboratively reviewed her medical history and medications in detail, updating the chart as shown in the encounter. Given the extensive information, we prioritized addressing her most pressing concerns, which she reported were: New pt (Pt is present to est care with pcp would like cpe concerned about being diabetes ) and Annual Exam (Pt would like cpe with labs today would like to check her for diabetes she has been drinking lot just staying trusty )  While the complexity of the patient's medical picture may necessitate further evaluation in subsequent visits, we were able to develop a preliminary care plan together. To expedite a comprehensive plan at the next visit, we encouraged the patient to gather relevant medical records from previous providers. This collaborative approach will ensure a more complete understanding of the patient's health and inform the development of a personalized care plan. We look forward to continuing the  conversation and working together with the patient on achieving her health goals.   Collaborative Documentation:  Today's encounter utilized real-time, dynamic patient engagement.  Patients actively participate by directly reviewing and assisting in updating their medical records through a shared screen. This transparency empowers patients to visually confirm chart updates made by the healthcare provider.  This collaborative approach facilitates problem management as we jointly update the problem list, problem overview, and assessment/plan. Ultimately, this process enhances chart accuracy and completeness, fostering shared decision-making, patient education, and informed consent for tests and treatments.  Collaborative Treatment Planning:  Treatment plans were discussed and reviewed in detail.  Explained medication safety and potential side effects.  Encouraged participation and answered all patient questions, confirming understanding and comfort with the plan. Encouraged patient to contact our office if they have any questions or concerns. Agreed on patient returning to office if symptoms worsen, persist, or new symptoms develop.  ----------------------------------------------------- Bernardino KANDICE Cone, MD  06/24/2024 2:22 PM  [place]  [facility]:  [phone]

## 2024-06-24 NOTE — Assessment & Plan Note (Signed)
 Shared decision-making done; patient understood rationale and agreed to [facility]

## 2024-06-24 NOTE — Assessment & Plan Note (Signed)
 Recurrent sinusitis with recent episodes. Previous ENT evaluation did not indicate need for surgery.

## 2024-06-24 NOTE — Telephone Encounter (Signed)
 Pt c/o medication issue:  1. Name of Medication: amLODipine  (NORVASC ) 2.5 MG tablet   2. How are you currently taking this medication (dosage and times per day)?  Take 1 tablet (2.5 mg total) by mouth daily.      3. Are you having a reaction (difficulty breathing--STAT)? No  4. What is your medication issue? Pt was seen in office today by [facility] and she stated this medication was supposed to be called in but it's not at the pharmacy [facility] . She stated they told her it's been discontinued. Please advise.

## 2024-06-24 NOTE — Assessment & Plan Note (Signed)
 Chronic TMJ with associated tinnitus. Discussed potential for occlusal splint to manage symptoms. Previous dental trays were unsuccessful due to fitting issues. Provide referral for occlusal splint fitting and prescribe Percocet for procedural pain management if needed.

## 2024-06-25 ENCOUNTER — Other Ambulatory Visit: Payer: Self-pay | Admitting: Internal Medicine

## 2024-06-25 DIAGNOSIS — M25562 Pain in left knee: Secondary | ICD-10-CM

## 2024-06-25 DIAGNOSIS — K219 Gastro-esophageal reflux disease without esophagitis: Secondary | ICD-10-CM

## 2024-06-25 LAB — COMPREHENSIVE METABOLIC PANEL WITH GFR
ALT: 24 IU/L (ref 0–32)
ALT: 24 U/L (ref 0–35)
AST: 24 IU/L (ref 0–40)
AST: 21 U/L (ref 0–37)
Albumin: 4.1 g/dL (ref 3.9–4.9)
Albumin: 4.2 g/dL (ref 3.5–5.2)
Alkaline Phosphatase: 65 IU/L (ref 41–116)
Alkaline Phosphatase: 47 U/L (ref 39–117)
BUN/Creatinine Ratio: 13 (ref 9–23)
BUN: 9 mg/dL (ref 6–24)
BUN: 8 mg/dL (ref 6–23)
Bilirubin Total: 0.3 mg/dL (ref 0.0–1.2)
CO2: 21 mmol/L (ref 20–29)
CO2: 24 meq/L (ref 19–32)
Calcium: 9.2 mg/dL (ref 8.7–10.2)
Calcium: 9.2 mg/dL (ref 8.4–10.5)
Chloride: 101 mmol/L (ref 96–106)
Chloride: 102 meq/L (ref 96–112)
Creatinine, Ser: 0.67 mg/dL (ref 0.57–1.00)
Creatinine, Ser: 0.55 mg/dL (ref 0.40–1.20)
GFR: 113.46 mL/min (ref >60.00)
Globulin, Total: 2.7 g/dL (ref 1.5–4.5)
Glucose, Bld: 84 mg/dL (ref 70–99)
Glucose: 85 mg/dL (ref 70–99)
Potassium: 4.6 mmol/L (ref 3.5–5.2)
Potassium: 3.9 meq/L (ref 3.5–5.1)
Sodium: 140 mmol/L (ref 134–144)
Sodium: 140 meq/L (ref 135–145)
Total Bilirubin: 0.3 mg/dL (ref 0.2–1.2)
Total Protein: 6.8 g/dL (ref 6.0–8.5)
Total Protein: 7.1 g/dL (ref 6.0–8.3)
eGFR: 113 mL/min/1.73 (ref >59)

## 2024-06-25 LAB — LIPID PANEL
Chol/HDL Ratio: 4.2 ratio (ref 0.0–4.4)
Cholesterol, Total: 211 mg/dL — ABNORMAL HIGH (ref 100–199)
Cholesterol: 203 mg/dL — ABNORMAL HIGH (ref 0–200)
HDL: 50 mg/dL (ref >39)
HDL: 50.6 mg/dL (ref >39.00)
LDL Chol Calc (NIH): 131 mg/dL — ABNORMAL HIGH (ref 0–99)
LDL Cholesterol: 115 mg/dL — ABNORMAL HIGH (ref 0–99)
NonHDL: 152.81
Total CHOL/HDL Ratio: 4
Triglycerides: 168 mg/dL — ABNORMAL HIGH (ref 0–149)
Triglycerides: 191 mg/dL — ABNORMAL HIGH (ref 0.0–149.0)
VLDL Cholesterol Cal: 30 mg/dL (ref 5–40)
VLDL: 38.2 mg/dL (ref 0.0–40.0)

## 2024-06-25 LAB — URINALYSIS W MICROSCOPIC + REFLEX CULTURE
Bilirubin Urine: NEGATIVE
Glucose, UA: NEGATIVE
Hgb urine dipstick: NEGATIVE
Hyaline Cast: NONE SEEN /LPF
Leukocyte Esterase: NEGATIVE
Nitrites, Initial: NEGATIVE
Protein, ur: NEGATIVE
Specific Gravity, Urine: 1.019 (ref 1.001–1.035)
Squamous Epithelial / HPF: NONE SEEN /HPF (ref <=5)
WBC, UA: NONE SEEN /HPF (ref 0–5)
pH: 6 (ref 5.0–8.0)

## 2024-06-25 LAB — C-REACTIVE PROTEIN: CRP: 4.4 mg/dL (ref 0.5–20.0)

## 2024-06-25 LAB — TSH+FREE T4: TSH W/REFLEX TO FT4: 2.21 m[IU]/L

## 2024-06-25 LAB — PARATHYROID HORMONE, INTACT (NO CA): PTH: 26 pg/mL (ref 16–77)

## 2024-06-25 LAB — NO CULTURE INDICATED

## 2024-06-25 MED ORDER — MELOXICAM 7.5 MG PO TABS: 7.5000 mg | ORAL_TABLET | ORAL | 4 refills | Status: AC | PRN

## 2024-06-25 MED ORDER — OMEPRAZOLE 40 MG PO CPDR: 40.0000 mg | DELAYED_RELEASE_CAPSULE | Freq: Two times a day (BID) | ORAL | 4 refills | Status: DC

## 2024-06-26 ENCOUNTER — Other Ambulatory Visit: Payer: Self-pay

## 2024-06-27 ENCOUNTER — Ambulatory Visit: Payer: Self-pay | Admitting: Physician Assistant

## 2024-06-27 DIAGNOSIS — R079 Chest pain, unspecified: Secondary | ICD-10-CM

## 2024-06-27 DIAGNOSIS — E785 Hyperlipidemia, unspecified: Secondary | ICD-10-CM

## 2024-06-28 ENCOUNTER — Ambulatory Visit: Payer: Self-pay | Admitting: Internal Medicine

## 2024-06-28 ENCOUNTER — Other Ambulatory Visit: Payer: Self-pay

## 2024-06-28 DIAGNOSIS — N3941 Urge incontinence: Secondary | ICD-10-CM

## 2024-07-01 MED ORDER — ROSUVASTATIN CALCIUM 10 MG PO TABS: 10.0000 mg | ORAL_TABLET | Freq: Every day | ORAL | 3 refills | Status: DC

## 2024-07-02 ENCOUNTER — Encounter (HOSPITAL_COMMUNITY): Payer: Self-pay

## 2024-07-02 ENCOUNTER — Other Ambulatory Visit (HOSPITAL_COMMUNITY): Payer: Self-pay

## 2024-07-02 MED ORDER — TRIAMCINOLONE ACETONIDE 0.025 % EX CREA
TOPICAL_CREAM | CUTANEOUS | 3 refills | Status: AC
  Filled 2024-07-02: qty 15, 30d supply, fill #0

## 2024-07-02 MED ORDER — KETOCONAZOLE 2 % EX CREA
TOPICAL_CREAM | CUTANEOUS | 5 refills | Status: AC
  Filled 2024-07-02: qty 30, 30d supply, fill #0

## 2024-07-02 NOTE — Telephone Encounter (Signed)
 Spoke with pt. Pt did receive mychart message and will start Crestor 10 mg. Pharmacy information corrected and sent to [facility]. Pt wanted to mention that she started the Amlodipine  10 mg on 06/27/24 and she is still having some high readings (130s/90s). Pt is aware that it may take a little time for her to see a decrease in BP. Please advise.

## 2024-07-04 ENCOUNTER — Ambulatory Visit
Admission: RE | Admit: 2024-07-04 | Discharge: 2024-07-04 | Disposition: A | Discharge status: Home / Self Care | Source: Ambulatory Visit | Attending: Physician Assistant | Admitting: Physician Assistant

## 2024-07-04 DIAGNOSIS — K219 Gastro-esophageal reflux disease without esophagitis: Secondary | ICD-10-CM | POA: Insufficient documentation

## 2024-07-04 DIAGNOSIS — F419 Anxiety disorder, unspecified: Secondary | ICD-10-CM | POA: Insufficient documentation

## 2024-07-04 DIAGNOSIS — R079 Chest pain, unspecified: Secondary | ICD-10-CM | POA: Diagnosis present

## 2024-07-04 DIAGNOSIS — J45990 Exercise induced bronchospasm: Secondary | ICD-10-CM | POA: Diagnosis not present

## 2024-07-04 DIAGNOSIS — K760 Fatty (change of) liver, not elsewhere classified: Secondary | ICD-10-CM | POA: Diagnosis not present

## 2024-07-04 DIAGNOSIS — I10 Essential (primary) hypertension: Secondary | ICD-10-CM | POA: Insufficient documentation

## 2024-07-04 LAB — NM PET CT CARDIAC PERFUSION MULTI W/ABSOLUTE BLOODFLOW
LV dias vol: 80 mL (ref 46–106)
MBFR: 2.51
Nuc Rest EF: 72 %
Nuc Stress EF: 72 %
Peak HR: 127 {beats}/min
Rest HR: 95 {beats}/min
Rest MBF: 1.52 ml/g/min
Rest Nuclear Isotope Dose: 24.9 mCi
SRS: 2
SSS: 11
ST Depression (mm): 0 mm
Stress MBF: 3.81 ml/g/min
Stress Nuclear Isotope Dose: 25.1 mCi
TID: 0.87

## 2024-07-04 MED ORDER — REGADENOSON 0.4 MG/5ML IV SOLN
0.4000 mg | Freq: Once | INTRAVENOUS | Status: AC
  Administered 2024-07-04: 0.4 mg via INTRAVENOUS
  Filled 2024-07-04: qty 5

## 2024-07-04 MED ORDER — RUBIDIUM RB82 GENERATOR (RUBYFILL)
25.0000 | PACK | Freq: Once | INTRAVENOUS | Status: AC
  Administered 2024-07-04: 25.08 via INTRAVENOUS

## 2024-07-04 MED ORDER — REGADENOSON 0.4 MG/5ML IV SOLN
INTRAVENOUS | Status: AC
  Filled 2024-07-04: qty 5

## 2024-07-04 MED ORDER — RUBIDIUM RB82 GENERATOR (RUBYFILL)
25.0000 | PACK | Freq: Once | INTRAVENOUS | Status: AC
  Administered 2024-07-04: 24.91 via INTRAVENOUS

## 2024-07-05 ENCOUNTER — Other Ambulatory Visit (HOSPITAL_COMMUNITY): Payer: Self-pay

## 2024-07-05 MED ORDER — ROSUVASTATIN CALCIUM 10 MG PO TABS
10.0000 mg | ORAL_TABLET | Freq: Every day | ORAL | 3 refills | Status: DC
  Filled 2024-07-05 (×4): qty 90, 90d supply, fill #0

## 2024-07-13 ENCOUNTER — Other Ambulatory Visit (HOSPITAL_COMMUNITY): Payer: Self-pay

## 2024-07-15 ENCOUNTER — Other Ambulatory Visit (HOSPITAL_COMMUNITY): Payer: Self-pay

## 2024-07-15 MED ORDER — NORETHIN ACE-ETH ESTRAD-FE 1-20 MG-MCG PO TABS
1.0000 | ORAL_TABLET | Freq: Every day | ORAL | 4 refills | Status: AC
  Filled 2024-07-15 – 2024-07-22 (×4): qty 112, 84d supply, fill #0
  Filled 2024-10-11 – 2024-10-13 (×2): qty 112, 84d supply, fill #1

## 2024-07-17 ENCOUNTER — Other Ambulatory Visit (HOSPITAL_COMMUNITY): Payer: Self-pay

## 2024-07-18 ENCOUNTER — Other Ambulatory Visit: Payer: Self-pay

## 2024-07-22 ENCOUNTER — Other Ambulatory Visit: Payer: Self-pay | Admitting: Internal Medicine

## 2024-07-22 ENCOUNTER — Other Ambulatory Visit (HOSPITAL_COMMUNITY): Payer: Self-pay

## 2024-07-22 ENCOUNTER — Other Ambulatory Visit: Payer: Self-pay

## 2024-07-22 MED ORDER — OMEPRAZOLE 40 MG PO CPDR
40.0000 mg | DELAYED_RELEASE_CAPSULE | Freq: Two times a day (BID) | ORAL | 0 refills | Status: DC
  Filled 2024-07-22: qty 60, 30d supply, fill #0

## 2024-07-23 ENCOUNTER — Other Ambulatory Visit: Payer: Self-pay

## 2024-07-23 ENCOUNTER — Other Ambulatory Visit (HOSPITAL_COMMUNITY): Payer: Self-pay

## 2024-07-23 MED ORDER — CONTRAVE 8-90 MG PO TB12
2.0000 | ORAL_TABLET | Freq: Two times a day (BID) | ORAL | 2 refills | Status: DC
  Filled 2024-07-23: qty 60, 15d supply, fill #0

## 2024-07-24 ENCOUNTER — Other Ambulatory Visit: Payer: Self-pay | Admitting: Allergy & Immunology

## 2024-07-24 ENCOUNTER — Other Ambulatory Visit (HOSPITAL_BASED_OUTPATIENT_CLINIC_OR_DEPARTMENT_OTHER): Payer: Self-pay

## 2024-07-24 ENCOUNTER — Other Ambulatory Visit: Payer: Self-pay

## 2024-07-24 ENCOUNTER — Other Ambulatory Visit (HOSPITAL_COMMUNITY): Payer: Self-pay

## 2024-07-24 ENCOUNTER — Ambulatory Visit: Admitting: Allergy & Immunology

## 2024-07-24 ENCOUNTER — Encounter: Payer: Self-pay | Admitting: Allergy & Immunology

## 2024-07-24 VITALS — BP 130/80 | HR 106 | Temp 98.0°F | Resp 18 | Ht 64.17 in | Wt 237.2 lb

## 2024-07-24 DIAGNOSIS — L508 Other urticaria: Secondary | ICD-10-CM | POA: Diagnosis not present

## 2024-07-24 DIAGNOSIS — J4540 Moderate persistent asthma, uncomplicated: Secondary | ICD-10-CM

## 2024-07-24 DIAGNOSIS — L509 Urticaria, unspecified: Secondary | ICD-10-CM

## 2024-07-24 DIAGNOSIS — J310 Chronic rhinitis: Secondary | ICD-10-CM

## 2024-07-24 DIAGNOSIS — E785 Hyperlipidemia, unspecified: Secondary | ICD-10-CM

## 2024-07-24 DIAGNOSIS — L239 Allergic contact dermatitis, unspecified cause: Secondary | ICD-10-CM

## 2024-07-24 DIAGNOSIS — K219 Gastro-esophageal reflux disease without esophagitis: Secondary | ICD-10-CM

## 2024-07-24 DIAGNOSIS — R079 Chest pain, unspecified: Secondary | ICD-10-CM

## 2024-07-24 MED ORDER — CONTRAVE 8-90 MG PO TB12
2.0000 | ORAL_TABLET | Freq: Two times a day (BID) | ORAL | 2 refills | Status: DC
  Filled 2024-07-24: qty 60, 15d supply, fill #0

## 2024-07-24 MED ORDER — NORETHIN ACE-ETH ESTRAD-FE 1-20 MG-MCG PO TABS: 1.0000 | ORAL_TABLET | Freq: Every day | ORAL | 4 refills | Status: AC

## 2024-07-24 MED ORDER — FLUCONAZOLE 150 MG PO TABS
150.0000 mg | ORAL_TABLET | ORAL | 0 refills | Status: DC
  Filled 2024-07-24: qty 2, 6d supply, fill #0

## 2024-07-24 NOTE — Progress Notes (Signed)
 NEW PATIENT  Date of Service/Encounter:  07/24/24  Consult requested by: Jesus Bernardino MATSU, MD   Assessment:   Pressure urticaria  Gastroesophageal reflux disease - adding Voquenza today  Chronic rhinitis - planning for skin testing at the next visit  Allergic contact dermatitis - consider patch testing  Moderate persistent asthma, uncomplicated - with normal spirometry today  Plan/Recommendations:   1. Pressure urticaria - with multiple food triggers - This sounds just like the pressure-induced urticaria. - We are going to get some labs to rule out serious causing of difficult to control causes of hives/swelling. - Many hive patients need 2-4 times the recommended dose of antihistamines to keep symptoms under good control. - We could consider the use of Xolair injections to help prevent the hives/swelling.  - Food testing sheet provided (circle what you are interested in testing and we can do the skin testing).   2. Gastroesophageal reflux disease, unspecified whether esophagitis present - Sample of Voquenza provided (this uses a COMPLETELY different receptor compared to the omeprazole  and the famotidine ). - Let us  know if you like this and we can send it in.   3. Chronic rhinitis - Because of insurance stipulations, we cannot do skin testing on the same day as your first visit. - We are all working to fight this, but for now we need to do two separate visits.  - We will know more after we do testing at the next visit.  - The skin testing visit can be squeezed in at your convenience.  - Then we can make a more full plan to address all of your symptoms. - Be sure to stop your antihistamines for 3 days before this appointment.   4. Allergic contact dermatitis - We can consider patch testing in the future if you are interested.  - These are placed on a Monday and then read as a Wednesday and Friday.   5. Return in about 1 week (around 07/31/2024) for ALLERGY TESTING (1-55  + [employer] FO[employer]S). You can have the follow up appointment with Dr. Iva or a Nurse Practicioner (our Nurse Practitioners are excellent and always have Physician oversight!).    This note in its entirety was forwarded to the Provider who requested this consultation.  Subjective:   Kaitlynn Tramontana is a 43 y.o. 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   Polyuria 06/24/2024   Polydipsia 06/24/2024   Chest discomfort 06/24/2024   Tinnitus of left ear 01/23/2024   Recurrent sinusitis 01/09/2024   Arthralgia of left knee 07/03/2023   Elevated LDL cholesterol level 06/28/2023   Prediabetes 06/28/2023   NAFLD (nonalcoholic fatty liver disease) 91/97/7975   Herniated nucleus pulposus, L4-5 right 06/23/2022   Low back pain 04/06/2022   Shoulder pain 10/20/2021   Microscopic hematuria 07/21/2020   Urge incontinence 07/21/2020   Referred otalgia of both ears 10/31/2019   Temporomandibular joint (TMJ) pain 10/31/2019   Double vision 06/04/2019   Paresthesia 06/04/2019   H/O partial thyroidectomy 07/03/2018   Vitamin D  deficiency 06/26/2018   Elevated hemoglobin A1c 06/26/2018   Hyperlipidemia 11/21/2016   Elevated C-reactive protein 06/30/2016   Arthralgia of multiple joints 06/30/2016   Laryngopharyngeal reflux 05/10/2016   Morbid obesity (HCC)  05/10/2016   Seasonal allergies 05/10/2016   AP (abdominal pain) 03/18/2016   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  Discussed the use of AI scribe software for clinical note transcription with the patient and/or guardian, who gave verbal consent to proceed.  Jeanell Yagi was referred by Jesus Bernardino MATSU, MD.     Ceclia is a 43 y.o. female presenting for an evaluation of a multitude of complaints, including physical urticaria.  Asthma/Respiratory Symptom History: She sees Dr. Annella. She is scheduled for PFTs on November 12th. She did not start her Symbicort  since she wanted to do the PFTs first.  The last pulmonology appointment was in September 2025.  At that time, the he has presumed asthma which was mostly exercise-induced.  He was concerned with some underlying inflammation especially morning, so he prescribed Symbicort  2 puffs twice daily.  He also referred her to [facility] and [facility].  She has seen Cardiology. Echocardiograph and cardiac PET-CT was normal aside from a fatty liver.   Allergic Rhinitis Symptom History: She has a history of allergic reactions to environmental allergens, identified through skin testing in her teens, and has developed an allergy to chlorhexidine . She experiences contact dermatitis with certain triggers and describes her hives as intensely itchy and deep, often accompanied by swelling.   Food Allergy Symptom History: She has flushing with one sip of any alcohol. She has an odd feeling that she does not feel well. This is any type of alcohol.   Skin Symptom History: She has a history of idiopathic urticaria since age 82, initially presenting as hives on her heels and later causing swelling in her hands and feet. Pressure is identified as a trigger, and she manages symptoms with daily Zyrtec. She avoids wearing shoes that cause pressure points to prevent flare-ups. She reports that she has not had hives or swelling since starting Zyrtec, except when exposed to pressure.  She has  experienced elevated CRP and sed rates intermittently over the past five to six years, leading to referrals to rheumatologists, but gamma tests have not revealed significant findings. She has been experiencing chest discomfort described as tightness, which led to a cardiac workup that was unremarkable. A pulmonologist suggested an allergist consultation after a normal chest X-ray.  GERD Symptom History: She sees GI. She is not sure whether this is food related or something else. She is wondering whether she has food intolerances. She sees GI tomorrow and the omeprazole  BID jas not helped. She has never been on Voquenza. Last endoscopy was years ago. 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   She reports gastrointestinal issues, including non-solid stools and discomfort after consuming certain foods, particularly those with heavy cream or alcohol. She experiences flushing and a sensation of not feeling well after consuming alcohol, regardless of type, and suspects possible food intolerances. She has a history of reflux, previously managed with omeprazole , which is no longer effective. She experiences reflux symptoms despite medication and has a history of slow digestion noted on a past endoscopy. She has a family history of colon cancer and is concerned about her gastrointestinal symptoms.  Otherwise, there  is no history of other atopic diseases, including drug allergies, stinging insect allergies, or contact dermatitis. There is no significant infectious history. Vaccinations are up to date.    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   Polyuria 06/24/2024   Polydipsia 06/24/2024   Chest discomfort 06/24/2024   Tinnitus of left ear 01/23/2024   Recurrent sinusitis 01/09/2024   Arthralgia of left knee 07/03/2023   Elevated LDL cholesterol level 06/28/2023   Prediabetes 06/28/2023   NAFLD (nonalcoholic fatty liver  disease) 04/21/2023   Herniated nucleus pulposus, L4-5 right 06/23/2022   Low back pain 04/06/2022   Shoulder pain 10/20/2021   Microscopic hematuria 07/21/2020   Urge incontinence 07/21/2020   Referred otalgia of both ears 10/31/2019   Temporomandibular joint (TMJ) pain 10/31/2019   Double vision 06/04/2019   Paresthesia 06/04/2019   H/O partial thyroidectomy 07/03/2018   Vitamin D  deficiency 06/26/2018   Elevated hemoglobin A1c 06/26/2018   Hyperlipidemia 11/21/2016   Elevated C-reactive protein 06/30/2016   Arthralgia of multiple joints 06/30/2016   Laryngopharyngeal reflux 05/10/2016   Morbid obesity (HCC) 05/10/2016   Seasonal allergies 05/10/2016   AP (abdominal pain) 03/18/2016   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   Chronic constipation 03/08/2016   Fissure in skin 03/08/2016   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   Drug allergy 01/26/2016   Idiopathic urticaria 01/26/2016   Allergic rhinitis due to pollen 01/26/2016   Arthritis 01/26/2016   Anxiety 11/10/2015   Elevated sed rate 04/20/2015   Degenerative disc disease, lumbar 01/17/2005    Medication List:  Allergies as of 07/24/2024       Reactions   Aspirin Swelling, Other (See Comments)   hoarseness   Chlorhexidine  Dermatitis, Rash   Esomeprazole Hives   Nsaids Other (See Comments)   Eyes swelling/loss of voice   Chlorhexidine  Gluconate Other (See Comments), Rash   Esomeprazole Magnesium Rash   Iodinated Contrast Media Hives        Medication List        Accurate as of July 24, 2024 12:19 PM. If you have any questions, ask your nurse or doctor.          albuterol  108 (90 Base) MCG/ACT inhaler Commonly known as: VENTOLIN  HFA Inhale 2 puffs into the lungs every 6 (six) hours as needed for wheezing or shortness of breath.   amLODipine  2.5 MG tablet Commonly known as: NORVASC  Take 1 tablet (2.5 mg total) by mouth daily.   azelastine  0.1 % nasal spray Commonly known as: ASTELIN  Place 1 spray into  both nostrils 2 (two) times daily. Use in each nostril as directed   cetirizine 10 MG tablet Commonly known as: ZYRTEC Take 10 mg by mouth at bedtime.   Contrave 8-90 MG Tb12 Generic drug: Naltrexone-buPROPion HCl ER Take 2 tablets by mouth 2 (two) times daily.   diphenhydrAMINE  2 % cream Commonly known as: BENADRYL  Apply 1 application topically 3 (three) times daily as needed (for hives.).   diphenhydrAMINE  25 mg capsule Commonly known as: BENADRYL  Take 25-50 mg by mouth every 6 (six) hours as needed (for hives.).   fluticasone  50 MCG/ACT nasal spray Commonly known as: FLONASE  Place 2 sprays into both nostrils daily.   ketoconazole 2 % cream Commonly known as: NIZORAL Apply to affected areas 1-2 times daily as needed for flares: mix with triamcinolone  cream   meloxicam  7.5 MG tablet Commonly known as: MOBIC  Take 1 tablet (7.5 mg total) by mouth as needed.  metFORMIN  500 MG 24 hr tablet Commonly known as: GLUCOPHAGE -XR TAKE 1 TABLET BY MOUTH ONCE DAILY   Norethin  Ace-Eth Estrad-FE 1-20 MG-MCG(24) Chew Commonly known as: Minastrin 24 Fe Chew 1 tablet by mouth daily.   Tarina  FE 1/20 EQ 1-20 MG-MCG tablet Generic drug: norethindrone -ethinyl estradiol -FE Take 1 tablet by mouth daily.   Tarina  FE 1/20 EQ 1-20 MG-MCG tablet Generic drug: norethindrone -ethinyl estradiol -FE Take 1 tablet by mouth daily, taking contiously skipping placebo pills   Nyamyc  powder Generic drug: nystatin  APPLY TO THE AFFECTED AREA(S) 2 TIMES PER DAY AS DIRECTED.   omeprazole  40 MG capsule Commonly known as: PRILOSEC Take 1 capsule (40 mg total) by mouth 2 (two) times daily.   omeprazole  40 MG capsule Commonly known as: PRILOSEC Take 1 capsule (40 mg total) by mouth 2 (two) times daily.   oxyCODONE -acetaminophen  5-325 MG tablet Commonly known as: PERCOCET/ROXICET Take 1 tablet by mouth every 4 (four) hours as needed for up to 5 days for severe pain (pain score 7-10).   rosuvastatin 10  MG tablet Commonly known as: CRESTOR Take 1 tablet (10 mg total) by mouth daily.   Symbicort  160-4.5 MCG/ACT inhaler Generic drug: budesonide -formoterol  Inhale 2 puffs into the lungs 2 (two) times daily.   tirzepatide 2.5 MG/0.5ML Pen Commonly known as: ZEPBOUND Inject 2.5 mg into the skin once a week.   triamcinolone  0.025 % cream Commonly known as: KENALOG  Apply to affected area daily for 2 weeks as needed for flares (mix with ketoconazole) not safe for long term use   vitamin C 1000 MG tablet Take 1,000 mg by mouth at bedtime.   VITAMIN D  PO Take 2,000 Units by mouth daily.        Birth History: non-contributory  Developmental History: non-contributory  Past Surgical History: Past Surgical History:  Procedure Laterality Date   LUMBAR LAMINECTOMY  2006   L4 and L5 discectomy micro, hemilaminectomy, had conscious sedation for    SPINE SURGERY  04/2005   Micro L4-L5 Lumbar Laminectomy/Discectomy   THYROID  LOBECTOMY Right 02/01/2018   Procedure: RIGHT THYROID  LOBECTOMY;  Surgeon: Eletha Boas, MD;  Location: [facility];  Service: General;  Laterality: Right;   WISDOM TOOTH EXTRACTION  2001     Family History: Family History  Problem Relation Age of Onset   Diabetes type II Mother    Heart disease Mother        CHF/CAD   Sleep apnea Mother    Kidney failure Mother    Anxiety disorder Mother    Depression Mother    Diabetes Mother    Hearing loss Mother    Hyperlipidemia Mother    Hypertension Mother    Kidney disease Mother    Obesity Mother    Varicose Veins Mother    Tongue cancer Father    Glaucoma Father    Heart disease Father        CAD   Cancer Father    Hyperlipidemia Father    Hypertension Father    Obesity Father    Diabetes type II Brother    Cataracts Brother    Hypertension Brother    Anxiety disorder Brother    Depression Brother    Heart attack Maternal Grandmother    Heart disease Maternal Grandmother    Hypertension Maternal  Grandmother    Other Maternal Grandfather        worked in haematologist mine   Colon cancer Paternal Grandmother    Diabetes Paternal Grandfather    Heart disease Paternal  Grandfather    Hypertension Paternal Grandfather    Anxiety disorder Brother    Depression Brother    Diabetes Brother    Hyperlipidemia Brother    Hypertension Brother    Obesity Brother    Allergic rhinitis Neg Hx    Angioedema Neg Hx    Asthma Neg Hx    Eczema Neg Hx    Immunodeficiency Neg Hx    Urticaria Neg Hx      Social History: Arwilda lives at home with her husband.  They live in a house that is 56 years old.  There is hardwood throughout the home.  They have carpeting in the bedroom.  They have gas heating and central cooling.  There are no animals inside of the home, previous owners had animals in the home.  There are no dust mite covers on the body.  There is no fracture exposure.  She currently works as a market researcher for the past 4 years.  There is no fume, chemical, or dust exposure.  They do not live near an interstate or industrial area.   Review of systems otherwise negative other than that mentioned in the HPI.    Objective:   Blood pressure 130/80, pulse (!) 106, temperature 98 F (36.7 C), temperature source Temporal, resp. rate 18, height 5' 4.17 (1.63 m), weight 237 lb 3.2 oz (107.6 kg), SpO2 97%. Body mass index is 40.5 kg/m.     Physical Exam Vitals reviewed.  Constitutional:      Appearance: She is well-developed.  HENT:     Head: Normocephalic and atraumatic.     Right Ear: Tympanic membrane, ear canal and external ear normal. No drainage, swelling or tenderness. Tympanic membrane is not injected, scarred, erythematous, retracted or bulging.     Left Ear: Tympanic membrane, ear canal and external ear normal. No drainage, swelling or tenderness. Tympanic membrane is not injected, scarred, erythematous, retracted or bulging.     Nose: No nasal deformity, septal  deviation, mucosal edema or rhinorrhea.     Right Turbinates: Enlarged, swollen and pale.     Left Turbinates: Enlarged, swollen and pale.     Right Sinus: No maxillary sinus tenderness or frontal sinus tenderness.     Left Sinus: No maxillary sinus tenderness or frontal sinus tenderness.     Mouth/Throat:     Mouth: Mucous membranes are not pale and not dry.     Pharynx: Uvula midline.  Eyes:     General:        Right eye: No discharge.        Left eye: No discharge.     Conjunctiva/sclera: Conjunctivae normal.     Right eye: Right conjunctiva is not injected. No chemosis.    Left eye: Left conjunctiva is not injected. No chemosis.    Pupils: Pupils are equal, round, and reactive to light.  Cardiovascular:     Rate and Rhythm: Normal rate and regular rhythm.     Heart sounds: Normal heart sounds.  Pulmonary:     Effort: Pulmonary effort is normal. No tachypnea, accessory muscle usage or respiratory distress.     Breath sounds: Normal breath sounds. No wheezing, rhonchi or rales.  Chest:     Chest wall: No tenderness.  Abdominal:     Tenderness: There is no abdominal tenderness. There is no guarding or rebound.  Lymphadenopathy:     Head:     Right side of head: No submandibular, tonsillar or occipital adenopathy.  Left side of head: No submandibular, tonsillar or occipital adenopathy.     Cervical: No cervical adenopathy.  Skin:    Coloration: Skin is not pale.     Findings: No abrasion, erythema, petechiae or rash. Rash is not papular, urticarial or vesicular.  Neurological:     Mental Status: She is alert.  Psychiatric:        Behavior: Behavior is cooperative.      Diagnostic studies:    Spirometry: results normal (FEV1: 3.04/102%, FVC: 3.69/101%, FEV1/FVC: 82%).    Spirometry consistent with normal pattern.   Allergy Studies: deferred due to insurance stipulations that require a separate visit for testing, but we did send a few labs.           Marty Shaggy, MD [facility] 

## 2024-07-24 NOTE — Telephone Encounter (Signed)
 While pt was being roomed today she stated that she was not taking her crestor and her contrave medication. Later pt calls and states that she went to pharmacy to pick up refill for these two medication  and medication had been discontinued. I immediately contacted her pharmacy and explained what happen and ask for her medication to be reinstated, and pt was called and informed that her medication was at the pharmacy waiting for her to pick up

## 2024-07-24 NOTE — Patient Instructions (Addendum)
 1. Pressure urticaria - with multiple food triggers - This sounds just like the pressure-induced urticaria. - We are going to get some labs to rule out serious causing of difficult to control causes of hives/swelling. - Many hive patients need 2-4 times the recommended dose of antihistamines to keep symptoms under good control. - We could consider the use of Xolair injections to help prevent the hives/swelling.  - Food testing sheet provided (circle what you are interested in testing and we can do the skin testing).   2. Gastroesophageal reflux disease, unspecified whether esophagitis present - Sample of Voquenza provided (this uses a COMPLETELY different receptor compared to the omeprazole  and the famotidine ). - Let us  know if you like this and we can send it in.   3. Chronic rhinitis - Because of insurance stipulations, we cannot do skin testing on the same day as your first visit. - We are all working to fight this, but for now we need to do two separate visits.  - We will know more after we do testing at the next visit.  - The skin testing visit can be squeezed in at your convenience.  - Then we can make a more full plan to address all of your symptoms. - Be sure to stop your antihistamines for 3 days before this appointment.   4. Allergic contact dermatitis - We can consider patch testing in the future if you are interested.  - These are placed on a Monday and then read as a Wednesday and Friday.   5. Return in about 1 week (around 07/31/2024) for ALLERGY TESTING (1-55 + [employer] FOODS). You can have the follow up appointment with Dr. Iva or a Nurse Practicioner (our Nurse Practitioners are excellent and always have Physician oversight!).    Please inform us  of any Emergency Department visits, hospitalizations, or changes in symptoms. Call us  before going to the ED for breathing or allergy symptoms since we might be able to fit you in for a sick visit. Feel free to contact us   anytime with any questions, problems, or concerns.  It was a pleasure to meet you today!  Websites that have reliable patient information: 1. American Academy of Asthma, Allergy, and Immunology: www.aaaai.org 2. Food Allergy Research and Education (FARE): foodallergy.org 3. Mothers of Asthmatics: http://www.asthmacommunitynetwork.org 4. American College of Allergy, Asthma, and Immunology: www.acaai.org      "Like" us  on Facebook and Instagram for our latest updates!      A healthy democracy works best when [employer] participate! Make sure you are registered to vote! If you have moved or changed any of your contact information, you will need to get this updated before voting! Scan the QR codes below to learn more!

## 2024-07-25 ENCOUNTER — Ambulatory Visit (INDEPENDENT_AMBULATORY_CARE_PROVIDER_SITE_OTHER)
Admission: RE | Admit: 2024-07-25 | Discharge: 2024-07-25 | Disposition: A | Discharge status: Home / Self Care | Source: Ambulatory Visit | Attending: Gastroenterology | Admitting: Gastroenterology

## 2024-07-25 ENCOUNTER — Encounter: Payer: Self-pay | Admitting: Gastroenterology

## 2024-07-25 ENCOUNTER — Ambulatory Visit: Admitting: Gastroenterology

## 2024-07-25 ENCOUNTER — Encounter: Payer: Self-pay | Admitting: Pharmacist

## 2024-07-25 ENCOUNTER — Other Ambulatory Visit: Payer: Self-pay

## 2024-07-25 ENCOUNTER — Other Ambulatory Visit (HOSPITAL_COMMUNITY): Payer: Self-pay

## 2024-07-25 VITALS — BP 132/82 | HR 87 | Ht 64.0 in | Wt 237.4 lb

## 2024-07-25 DIAGNOSIS — K5909 Other constipation: Secondary | ICD-10-CM

## 2024-07-25 DIAGNOSIS — K760 Fatty (change of) liver, not elsewhere classified: Secondary | ICD-10-CM

## 2024-07-25 DIAGNOSIS — K219 Gastro-esophageal reflux disease without esophagitis: Secondary | ICD-10-CM | POA: Diagnosis not present

## 2024-07-25 MED ORDER — ROSUVASTATIN CALCIUM 10 MG PO TABS
10.0000 mg | ORAL_TABLET | Freq: Every day | ORAL | 3 refills | Status: AC
  Filled 2024-07-25: qty 90, 90d supply, fill #0

## 2024-07-25 NOTE — Patient Instructions (Addendum)
 _______________________________________________________  If your blood pressure at your visit was 140/90 or greater, please contact your primary care physician to follow up on this.  _______________________________________________________  If you are age 42 or older, your body mass index should be between 23-30. Your Body mass index is 40.75 kg/m. If this is out of the aforementioned range listed, please consider follow up with your Primary Care Provider.  If you are age 21 or younger, your body mass index should be between 19-25. Your Body mass index is 40.75 kg/m. If this is out of the aformentioned range listed, please consider follow up with your Primary Care Provider.   ________________________________________________________  The [facility] providers would like to encourage you to use MYCHART to communicate with providers for non-urgent requests or questions.  Due to long hold times on the telephone, sending your provider a message by [facility] may be a faster and more efficient way to get a response.  Please allow 48 business hours for a response.  Please remember that this is for non-urgent requests.  _______________________________________________________  [facility] is using a team-based approach to care.  Your team is made up of your doctor and two to three APPS. Our APPS (Nurse Practitioners and Physician Assistants) work with your physician to ensure care continuity for you. They are fully qualified to address your health concerns and develop a treatment plan. They communicate directly with your gastroenterologist to care for you. Seeing the Advanced Practice Practitioners on your physician's team can help you by facilitating care more promptly, often allowing for earlier appointments, access to diagnostic testing, procedures, and other specialty referrals.   Your provider has requested that you have an abdominal x ray before leaving today. Please go to the basement floor  to our Radiology department for the test.  Due to recent changes in healthcare laws, you may see the results of your imaging and laboratory studies on MyChart before your provider has had a chance to review them.  We understand that in some cases there may be results that are confusing or concerning to you. Not all laboratory results come back in the same time frame and the provider may be waiting for multiple results in order to interpret others.  Please give us  48 hours in order for your provider to thoroughly review all the results before contacting the office for clarification of your results.    VISIT SUMMARY:  Today, we discussed your ongoing issues with reflux and inconsistent bowel movements. We reviewed your current medications and symptoms, and we have made some changes to your treatment plan to help manage your conditions more effectively.  YOUR PLAN:  -CHRONIC CONSTIPATION WITH OVERFLOW DIARRHEA: Chronic constipation with overflow diarrhea means that stool builds up in your intestines, leading to episodes of severe diarrhea. We recommend starting Benefiber as a fiber supplement and increasing your water intake to help manage this. We have also ordered an abdominal x-ray to assess the stool burden. If fiber alone is not enough, we may add Miralax. Eating frequent small meals and regular physical activity can also help. We discussed the potential side effects of weight loss medications, including nausea, vomiting, diarrhea, and severe constipation.  -GASTROESOPHAGEAL REFLUX DISEASE AND FUNCTIONAL DYSPEPSIA: Gastroesophageal reflux disease (GERD) and functional dyspepsia involve chronic acid reflux and stomach discomfort. Since omeprazole  is no longer effective, we are starting you on Voquezna 10 mg daily, to be taken 30 minutes before breakfast. We provided samples of Voquezna. If your symptoms persist, we may consider an  endoscopy. We also ordered a stool study to check for H. pylori  infection. If Voquezna is not effective, we may try alternative treatments like Reflux Gourmet.  INSTRUCTIONS:  Please follow up after completing the abdominal x-ray and stool study. Continue with the new medications and lifestyle changes as discussed. If you experience any severe side effects or if your symptoms do not improve, contact our office.  We will request EGD/Colon records from Dr Spainhour.  Thank you for entrusting me with your care and choosing [facility].  Nestor Blower, PA-C

## 2024-07-25 NOTE — Progress Notes (Signed)
 Chief Complaint: chronic GI symptoms Primary GI MD: Unassigned  HPI: Discussed the use of AI scribe software for clinical note transcription with the patient, who gave verbal consent to proceed.  Christy Alexander is a 43 year old female with GERD and chronic gastrointestinal issues who presents with worsening reflux and inconsistent bowel movements. She is accompanied by her husband, Medford.  She has been experiencing reflux for years and has been taking omeprazole  40 mg twice daily. However, the medication no longer alleviates her symptoms and sometimes triggers reflux, allowing her to taste the pill after ingestion. She has also experienced chest discomfort, for which she consulted a cardiologist, but cardiac tests were normal. She experiences belching, regurgitation, and chest discomfort associated with her reflux symptoms.  She reports inconsistent bowel movements, with episodes of 'explosive diarrhea' occurring shortly after meals, interspersed with periods of normal bowel movements. This pattern has progressively worsened over the past couple of years. She has identified certain foods, such as heavy cream and dark leafy greens, as potential triggers, although the reactions are inconsistent. She experiences sharp abdominal pain during these episodes, which improves after bowel movements. Dehydration is a concern due to insufficient fluid intake.  Her bowel movement frequency varies from daily to every few days. Recently, she has been consuming more fish and shrimp, which she believes may have improved her symptoms. She experiences sharp abdominal pain that precedes the need for urgent bowel movements, which can be multiple in succession until the trigger is cleared. She has not tried any treatments for her bowel movements due to concerns about masking underlying issues.  Her past medical history includes a colonoscopy in 2015 due GI symptoms and FH of colon cancer(grandmother), which was normal.  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 She has a history of constipation since childhood, leading to anal fissures in her past. She has used a training and development officer to aid bowel movements. Previously done procedures with Dr. Spainhour  She has a history of a thyroid  nodule removal and occasionally uses meloxicam  for lower back pain due to a herniated disc. She has not started the prescribed weight loss medication due to insurance coverage issues and is considering lifestyle changes to aid weight loss. She reports a stressful job and recent loss of her mother, which may contribute to her symptoms.   Past Medical History:  Diagnosis Date   Abnormal cervical Papanicolaou smear 06/24/2024   2007 COLPO/ CRYO     Abnormal menstrual periods 01/17/2022   Allergy    Anxiety    Back pain at L4-L5 level    Blood transfusion without reported diagnosis 04/2005   Auto Transfusion Post Lumbar Laminectomy/Discectomy   BMI 30.0-30.9,adult    Chest pain    Constipation    Delayed pressure urticaria dx age 3   trauma to skin causes deep tissue  swelling   Depression    Dysphonia 07/03/2018   GERD (gastroesophageal reflux disease)    Hyperglycemia    Hyperlipidemia    Laryngopharyngeal reflux    Lower back pain    l4 to l5   Obesity    Pain in joint of left shoulder 02/10/2022   Right thyroid  nodule    Seasonal allergies    Thyroid  nodule 05/10/2016   Right lobe FNA negative. Followed by ENT S/P right thyroidectomy 5/19, U/S 12/04/19 negative no nodules, 02/05/21 negative/no nodules     TMJ (dislocation of temporomandibular joint)  Urticaria    Vitamin D  deficiency     Past Surgical History:  Procedure Laterality Date   LUMBAR LAMINECTOMY  2006   L4 and L5 discectomy micro, hemilaminectomy, had conscious sedation for    SPINE SURGERY  04/2005   Micro L4-L5 Lumbar Laminectomy/Discectomy   THYROID  LOBECTOMY  Right 02/01/2018   Procedure: RIGHT THYROID  LOBECTOMY;  Surgeon: Eletha Boas, MD;  Location: [facility];  Service: General;  Laterality: Right;   WISDOM TOOTH EXTRACTION  2001    Current Outpatient Medications  Medication Sig Dispense Refill   amLODipine  (NORVASC ) 2.5 MG tablet Take 1 tablet (2.5 mg total) by mouth daily. (Patient taking differently: Take 5 mg by mouth daily. Takes 2 at bedtime) 90 tablet 3   Ascorbic Acid (VITAMIN C) 1000 MG tablet Take 1,000 mg by mouth at bedtime.     cetirizine (ZYRTEC) 10 MG tablet Take 10 mg by mouth at bedtime.      diphenhydrAMINE  (BENADRYL ) 2 % cream Apply 1 application topically 3 (three) times daily as needed (for hives.).     diphenhydrAMINE  (BENADRYL ) 25 mg capsule Take 25-50 mg by mouth every 6 (six) hours as needed (for hives.).      fluconazole  (DIFLUCAN ) 150 MG tablet Take 1 tablet (150 mg total) by mouth every 3 (three) days. 2 tablet 0   ketoconazole (NIZORAL) 2 % cream Apply to affected areas 1-2 times daily as needed for flares: mix with triamcinolone  cream 30 g 5   meloxicam  (MOBIC ) 7.5 MG tablet Take 1 tablet (7.5 mg total) by mouth as needed. 90 tablet 4   metFORMIN  (GLUCOPHAGE -XR) 500 MG 24 hr tablet TAKE 1 TABLET BY MOUTH ONCE DAILY 90 tablet 3   norethindrone -ethinyl estradiol -FE (JUNEL  FE 1/20) 1-20 MG-MCG tablet 1 tablet by mouth daily, taking contiously skipping placebo pills     nystatin  (MYCOSTATIN /NYSTOP ) powder APPLY TO THE AFFECTED AREA(S) 2 TIMES PER DAY AS DIRECTED. 60 g 1   omeprazole  (PRILOSEC) 40 MG capsule Take 1 capsule (40 mg total) by mouth 2 (two) times daily. 180 capsule 4   omeprazole  (PRILOSEC) 40 MG capsule Take 1 capsule (40 mg total) by mouth 2 (two) times daily. 60 capsule 0   rosuvastatin (CRESTOR) 10 MG tablet Take 1 tablet (10 mg total) by mouth daily. 90 tablet 3   triamcinolone  (KENALOG ) 0.025 % cream Apply to affected area daily for 2 weeks as needed for flares (mix with ketoconazole) not safe for long term  use 15 g 3   VITAMIN D  PO Take 2,000 Units by mouth daily.     albuterol  (VENTOLIN  HFA) 108 (90 Base) MCG/ACT inhaler Inhale 2 puffs into the lungs every 6 (six) hours as needed for wheezing or shortness of breath. (Patient not taking: Reported on 07/24/2024) 6.7 g 6   azelastine  (ASTELIN ) 0.1 % nasal spray Place 1 spray into both nostrils 2 (two) times daily. Use in each nostril as directed (Patient not taking: Reported on 07/24/2024) 30 mL 0   budesonide -formoterol  (SYMBICORT ) 160-4.5 MCG/ACT inhaler Inhale 2 puffs into the lungs 2 (two) times daily. (Patient not taking: Reported on 07/24/2024) 10.2 g 12   fluticasone  (FLONASE ) 50 MCG/ACT nasal spray Place 2 sprays into both nostrils daily. (Patient not taking: Reported on 07/24/2024) 16 g 0   Naltrexone-buPROPion HCl ER (CONTRAVE) 8-90 MG TB12 Take 2 tablets by mouth 2 (two) times daily. (Patient not taking: Reported on 07/25/2024) 60 tablet 2   norethindrone -ethinyl estradiol -FE (JUNEL  FE 1/20) 1-20 MG-MCG tablet Take 1  tablet by mouth daily, taking contiously skipping placebo pills 112 tablet 4   norethindrone -ethinyl estradiol -FE (JUNEL  FE 1/20) 1-20 MG-MCG tablet Take 1 tablet by mouth daily, taking contiously skipping placebo pills 112 tablet 4   No current facility-administered medications for this visit.    Allergies as of 07/25/2024 - Review Complete 07/25/2024  Allergen Reaction Noted   Aspirin Swelling and Other (See Comments) 01/26/2016   Chlorhexidine  Dermatitis and Rash 01/26/2016   Esomeprazole Hives 10/15/2020   Nsaids Other (See Comments) 01/26/2016   Chlorhexidine  gluconate Other (See Comments) and Rash 07/18/2022   Esomeprazole magnesium Rash 07/18/2022   Iodinated contrast media Hives 01/23/2018    Family History  Problem Relation Age of Onset   Diabetes type II Mother    Heart disease Mother        CHF/CAD   Sleep apnea Mother    Kidney failure Mother    Anxiety disorder Mother    Depression Mother    Diabetes  Mother    Hearing loss Mother    Hyperlipidemia Mother    Hypertension Mother    Kidney disease Mother    Obesity Mother    Varicose Veins Mother    Tongue cancer Father    Glaucoma Father    Heart disease Father        CAD   Cancer Father    Hyperlipidemia Father    Hypertension Father    Obesity Father    Diabetes type II Brother    Cataracts Brother    Hypertension Brother    Anxiety disorder Brother    Depression Brother    Heart attack Maternal Grandmother    Heart disease Maternal Grandmother    Hypertension Maternal Grandmother    Other Maternal Grandfather        worked in haematologist mine   Colon cancer Paternal Grandmother    Diabetes Paternal Grandfather    Heart disease Paternal Grandfather    Hypertension Paternal Grandfather    Anxiety disorder Brother    Depression Brother    Diabetes Brother    Hyperlipidemia Brother    Hypertension Brother    Obesity Brother    Allergic rhinitis Neg Hx    Angioedema Neg Hx    Asthma Neg Hx    Eczema Neg Hx    Immunodeficiency Neg Hx    Urticaria Neg Hx     Social History   Socioeconomic History   Marital status: Married    Spouse name: Not on file   Number of children: 0   Years of education: college   Highest education level: Master's degree (e.g., MA, MS, MEng, MEd, MSW, MBA)  Occupational History   Occupation: RN   Occupation: Nurse works for [employer]  Tobacco Use   Smoking status: Never    Passive exposure: Never   Smokeless tobacco: Never  Vaping Use   Vaping status: Never Used  Substance and Sexual Activity   Alcohol use: No   Drug use: No   Sexual activity: Not Currently    Birth control/protection: Pill  Other Topics Concern   Not on file  Social History Narrative   Lives at home with her husband.   Right-handed.   1 cup coffee per day.   Social Drivers of Corporate Investment Banker Strain: Low Risk  (06/23/2024)   Overall Financial Resource Strain (CARDIA)    Difficulty of Paying Living  Expenses: Not hard at all  Food Insecurity: No Food Insecurity (06/23/2024)   Hunger Vital  Sign    Worried About Programme Researcher, Broadcasting/film/video in the Last Year: Never true    Ran Out of Food in the Last Year: Never true  Transportation Needs: No Transportation Needs (06/23/2024)   PRAPARE - Administrator, Civil Service (Medical): No    Lack of Transportation (Non-Medical): No  Physical Activity: Inactive (06/23/2024)   Exercise Vital Sign    Days of Exercise per Week: 0 days    Minutes of Exercise per Session: Not on file  Stress: Stress Concern Present (06/23/2024)   [employer] of Occupational Health - Occupational Stress Questionnaire    Feeling of Stress: Rather much  Social Connections: Socially Isolated (06/23/2024)   Social Connection and Isolation Panel    Frequency of Communication with Friends and Family: Once a week    Frequency of Social Gatherings with Friends and Family: Once a week    Attends Religious Services: Never    Database Administrator or Organizations: No    Attends Engineer, Structural: Not on file    Marital Status: Married  Catering Manager Violence: Not At Risk (02/05/2024)   Received from [facility]   HITS    Over the last 12 months how often did your partner physically hurt you?: Never    Over the last 12 months how often did your partner insult you or talk down to you?: Never    Over the last 12 months how often did your partner threaten you with physical harm?: Never    Over the last 12 months how often did your partner scream or curse at you?: Never    Review of Systems:    Constitutional: No weight loss, fever, chills, weakness or fatigue HEENT: Eyes: No change in vision               Ears, Nose, Throat:  No change in hearing or congestion Skin: No rash or itching Cardiovascular: No chest pain, chest pressure or palpitations   Respiratory: No SOB or cough Gastrointestinal: See HPI and otherwise negative Genitourinary: No dysuria  or change in urinary frequency Neurological: No headache, dizziness or syncope Musculoskeletal: No new muscle or joint pain Hematologic: No bleeding or bruising Psychiatric: No history of depression or anxiety    Physical Exam:  Vital signs: BP 132/82   Pulse 87   Ht 5' 4 (1.626 m)   Wt 237 lb 6 oz (107.7 kg)   LMP  (LMP Unknown)   SpO2 98%   BMI 40.75 kg/m   Constitutional: NAD, alert and cooperative Head:  Normocephalic and atraumatic. Eyes:   PEERL, EOMI. No icterus. Conjunctiva pink. Respiratory: Respirations even and unlabored. Lungs clear to auscultation bilaterally.   No wheezes, crackles, or rhonchi.  Cardiovascular:  Regular rate and rhythm. No peripheral edema, cyanosis or pallor.  Gastrointestinal:  Soft, nondistended, nontender. No rebound or guarding. Normal bowel sounds. No appreciable masses or hepatomegaly. Rectal:  Declines Msk:  Symmetrical without gross deformities. Without edema, no deformity or joint abnormality.  Neurologic:  Alert and  oriented x4;  grossly normal neurologically.  Skin:   Dry and intact without significant lesions or rashes. Psychiatric: Oriented to person, place and time. Demonstrates good judgement and reason without abnormal affect or behaviors.   RELEVANT LABS AND IMAGING: CBC    Component Value Date/Time   WBC 8.1 06/24/2024 1429   RBC 4.40 06/24/2024 1429   HGB 13.1 06/24/2024 1429   HGB 12.9 06/04/2019 0909   HCT 38.9  06/24/2024 1429   HCT 39.4 06/04/2019 0909   PLT 304.0 06/24/2024 1429   MCV 88.5 06/24/2024 1429   MCV 88 06/04/2019 0909   MCH 29.2 10/15/2020 0934   MCHC 33.7 06/24/2024 1429   RDW 13.1 06/24/2024 1429   RDW 13.5 06/04/2019 0909   LYMPHSABS 3.1 06/24/2024 1429   LYMPHSABS 1.8 06/04/2019 0909   MONOABS 0.4 06/24/2024 1429   EOSABS 0.1 06/24/2024 1429   EOSABS 0.1 06/04/2019 0909   BASOSABS 0.0 06/24/2024 1429   BASOSABS 0.1 06/04/2019 0909    CMP     Component Value Date/Time   NA 140  06/24/2024 1429   NA 140 06/24/2024 1129   K 3.9 06/24/2024 1429   CL 102 06/24/2024 1429   CO2 24 06/24/2024 1429   GLUCOSE 84 06/24/2024 1429   GLUCOSE 85 06/24/2024 1129   BUN 8 06/24/2024 1429   BUN 9 06/24/2024 1129   CREATININE 0.55 06/24/2024 1429   CREATININE 0.69 10/15/2020 0934   CALCIUM  9.2 06/24/2024 1429   PROT 7.1 06/24/2024 1429   PROT 6.8 06/24/2024 1129   ALBUMIN 4.2 06/24/2024 1429   ALBUMIN 4.1 06/24/2024 1129   AST 21 06/24/2024 1429   ALT 24 06/24/2024 1429   ALKPHOS 47 06/24/2024 1429   BILITOT 0.3 06/24/2024 1429   BILITOT 0.3 06/24/2024 1129   GFRNONAA >60 07/13/2023 1813   GFRNONAA 110 10/15/2020 0934   GFRAA 128 10/15/2020 0934     Assessment/Plan:   GERD Longstanding gerd with previous negative EGD not well controlled on omeprazole  40mg  twice daily. Recently given samples of voquezna but has not started it yet.  Characterized by retrosternal chest pain and occur with eating/on exertion, regurgitation, burning.  Occasional NSAID use with Mobic . - Provided samples of Voquezna 10 mg - Educated patient on lifestyle modifications provided patient education handout - Follow-up 6 to 8 weeks  Chronic constipation Longstanding constipation since childhood now progressing to bouts of diarrhea with urgency after eating.  She can go multiple days without a bowel movement or have loose stools with urgency.  Suspect overall clinical picture for worsening constipation leading to overflow diarrhea.  She is not on anything for her constipation.  Previous colonoscopy in 2015, we will obtain these records.  Normal TSH.  Suspect recent job stress and losing her mother could be contributing to her worsening symptoms as well -- KUB per patient request to evaluate stool burden - Trial of fiber, if no improvement with fiber would do MiraLAX 1 capful per day and adjust dose based on response - Increase water, increase fiber, increase exercise - Follow-up 6 to 8  weeks  Gastroparesis/bezoar (?) Questionable history of EGD with bezoar per patient report indicating gastroparesis which could worsen her upper GI symptoms..  Suspect patient has delayed colonic transit also leading to constipation.  Extensive discussion with patient and husband as patient is set up to try injections for weight loss and discussed the side effects in regards to delayed gastric emptying and worsening constipation.  Will obtain previous EGD  MASH US  abdomen complete for elevated LFTs (ALT 49) 04/2023 with steatosis and normal gallbladder.  LFTs have been normal since October 2024.  BMI of 40.  Currently discussing Zepbound for weight loss with PCP. FIB 4 score 0.58 excluding advanced fibrosis - Educated patient on MASH and recommend diet and weight loss.  Not a candidate for Rezzdiffra at this time - Recent LFTs normal.  Chest discomfort with exertion Follows with cardiology. echocardiogram 05/2022  with preserved LVEF 60-65% and no valvular disease.  Normal EKG.  Recent negative stress test  Assign to Dr. Albertus (Thursday)  Haydon Kalmar Mollie RIGGERS [facility] 07/25/2024, 12:26 PM  Cc: Madie Jon Garre, PA

## 2024-07-26 ENCOUNTER — Telehealth: Payer: Self-pay | Admitting: Gastroenterology

## 2024-07-26 NOTE — Telephone Encounter (Signed)
 EGD 06/16/2020 treatment for dysphagia and heartburn with Dr Rollin - Esophagus was normal - Large amount of food residue in stomach - Food residue in duodenum - Active regurgitation of refluxate was noted in the distal esophagus.  Dilation not possible due to food. - no specimens collects

## 2024-07-27 ENCOUNTER — Other Ambulatory Visit (HOSPITAL_COMMUNITY): Payer: Self-pay

## 2024-07-30 ENCOUNTER — Ambulatory Visit: Payer: Self-pay | Admitting: Allergy & Immunology

## 2024-07-30 LAB — ALPHA-GAL PANEL
Allergen Lamb IgE: <0.1 kU/L
Beef IgE: <0.1 kU/L
IgE (Immunoglobulin E), Serum: 37 [IU]/mL (ref 6–495)
O215-IgE Alpha-Gal: <0.1 kU/L
Pork IgE: <0.1 kU/L

## 2024-07-30 LAB — THYROID ANTIBODIES (THYROPEROXIDASE & THYROGLOBULIN)
Thyroglobulin Antibody: <1 [IU]/mL (ref 0.0–0.9)
Thyroperoxidase Ab SerPl-aCnc: 13 [IU]/mL (ref 0–34)

## 2024-07-30 LAB — ANTINUCLEAR ANTIBODIES, IFA

## 2024-07-30 LAB — SEDIMENTATION RATE: Sed Rate: 20 mm/h (ref 0–32)

## 2024-07-30 LAB — C-REACTIVE PROTEIN: CRP: 25 mg/L — ABNORMAL HIGH (ref 0–10)

## 2024-07-30 LAB — CHRONIC URTICARIA PD-BAT: Pooled Donor- BAT CU: 7.8 % (ref 0.00–10.60)

## 2024-07-30 LAB — TRYPTASE: Tryptase: 5.1 ug/L (ref 2.2–13.2)

## 2024-07-31 ENCOUNTER — Encounter: Payer: Self-pay | Admitting: Pulmonary Disease

## 2024-07-31 ENCOUNTER — Ambulatory Visit

## 2024-07-31 ENCOUNTER — Ambulatory Visit: Payer: Self-pay | Admitting: Gastroenterology

## 2024-07-31 ENCOUNTER — Ambulatory Visit: Admitting: Pulmonary Disease

## 2024-07-31 VITALS — BP 132/80 | HR 116 | Ht 64.0 in | Wt 240.6 lb

## 2024-07-31 DIAGNOSIS — R0609 Other forms of dyspnea: Secondary | ICD-10-CM | POA: Diagnosis not present

## 2024-07-31 LAB — PULMONARY FUNCTION TEST
DL/VA % pred: 119 %
DL/VA: 5.29 ml/min/mmHg/L
DLCO cor % pred: 120 %
DLCO cor: 26.18 ml/min/mmHg
DLCO unc % pred: 119 %
DLCO unc: 25.94 ml/min/mmHg
FEF 25-75 Post: 4.01 L/s
FEF 25-75 Pre: 3.45 L/s
FEF2575-%Change-Post: 16 %
FEF2575-%Pred-Post: 131 %
FEF2575-%Pred-Pre: 112 %
FEV1-%Change-Post: 4 %
FEV1-%Pred-Post: 111 %
FEV1-%Pred-Pre: 106 %
FEV1-Post: 3.32 L
FEV1-Pre: 3.16 L
FEV1FVC-%Change-Post: 4 %
FEV1FVC-%Pred-Pre: 101 %
FEV6-%Change-Post: 0 %
FEV6-%Pred-Post: 106 %
FEV6-%Pred-Pre: 105 %
FEV6-Post: 3.82 L
FEV6-Pre: 3.79 L
FEV6FVC-%Change-Post: 0 %
FEV6FVC-%Pred-Post: 101 %
FEV6FVC-%Pred-Pre: 101 %
FVC-%Change-Post: 0 %
FVC-%Pred-Post: 104 %
FVC-%Pred-Pre: 103 %
FVC-Post: 3.82 L
FVC-Pre: 3.8 L
Post FEV1/FVC ratio: 87 %
Post FEV6/FVC ratio: 100 %
Pre FEV1/FVC ratio: 83 %
Pre FEV6/FVC Ratio: 100 %
RV % pred: 101 %
RV: 1.65 L
TLC % pred: 107 %
TLC: 5.45 L

## 2024-07-31 NOTE — Patient Instructions (Signed)
 Nice to see you again  The results of your pulmonary function test are totally normal, this is great news.  The DLCO which is an estimate of how well you exchange oxygen and carbon dioxide is slightly elevated which sometimes we can see a small airways disease or asthma but it certainly not a strong predictor.  Try using albuterol  10 to 15 minutes before exercise.  If this does not help, then I do not think asthma is the issue.  If it does help, please let me know and we can decide if we should just continue albuterol  prior to exercise or for longer acting inhaler but avoid inhaled steroids to minimize the risk of thrush would be beneficial to take on a daily basis.  Return to clinic as needed

## 2024-07-31 NOTE — Progress Notes (Signed)
 Full pft performed today

## 2024-07-31 NOTE — Progress Notes (Signed)
 @Patient  ID: Christy Alexander, female    DOB: 1981/02/11, 43 y.o.   MRN: [id number]  Chief Complaint  Patient presents with   Medical Management of Chronic Issues    Post PFT     Referring provider: Kirbi Farrugia, Donnice SAUNDERS, MD  HPI:   43 y.o. woman whom were seeing for evaluation of chest discomfort, exercise intolerance.  Most recent cardiology note reviewed.  Returns for routine follow-up.  PFTs performed a full interpreted below but these are normal.  Great news.  No evidence of air trapping or obstruction suggest more airways disease etc.  Prescription for last visit would help with her dyspnea and chest discomfort.  She did not use this given concern for thrush.  In the interim she has had spirometry at the allergist office that is normal on my review and interpretation.  She had a nuclear medicine stress test that is within normal limits via her cardiologist.  All reassuring news.  We discussed at length normal PFTs, lungs are healthy.  Asthma still possible but seems less likely but there are no other subtle hint of an elevated DLCO.  Discussed trial of albuterol  prior to exercise see if it helps, if so we can consider long-acting bronchodilators on a daily basis to see if this is beneficial.  We would avoid ICS given concern for thrush.  HPI initial visit: Patient notes difficulty with exercise, exercise intolerance for many years.  Even as young as in school or younger adult.  Unable to keep up with others exercising.  Short of breath.  Weight is fluctuated but even with lower weights this has been an issue.  She is noting similar issues recently.  But in addition to the shortness of breath has been associate with chest discomfort.  Heaviness or tightness.  Gets better with rest.  Has not used inhalers.  No time of day when things are better or worse.  No seasonal or environmental factors she can identify the main thing is better or worse.  She endorses GERD and reflux.  She reports an  upper airway high-pitched sound when she forces exhalation.  She notes she had had elevated inflammatory markers in the past.  Seeing rheumatology on multiple occasions.  Without cause despite significant serologic testing.  Echocardiogram 05/2022 reviewed and without significant findings.  CT coronary 05/2022 without significant calcification.  Echocardiogram 04/2024 revealed trivial MVR, otherwise no significant findings.    Questionaires / Pulmonary Flowsheets:   ACT:      No data to display          MMRC:     No data to display          Epworth:      No data to display          Tests:   FENO:  No results found for: NITRICOXIDE  PFT:    Latest Ref Rng & Units 07/31/2024    9:39 AM  PFT Results  FVC-Pre L 3.80  P  FVC-Predicted Pre % 103  P  FVC-Post L 3.82  P  FVC-Predicted Post % 104  P  Pre FEV1/FVC % % 83  P  Post FEV1/FCV % % 87  P  FEV1-Pre L 3.16  P  FEV1-Predicted Pre % 106  P  FEV1-Post L 3.32  P  DLCO uncorrected ml/min/mmHg 25.94  P  DLCO UNC% % 119  P  DLCO corrected ml/min/mmHg 26.18  P  DLCO COR %Predicted % 120  P  DLVA Predicted %  119  P  TLC L 5.45  P  TLC % Predicted % 107  P  RV % Predicted % 101  P    P Preliminary result  Personally viewed interpret as normal spirometry, no bronchodilator response.  Lung volumes within normal notes.  DLCO is slightly elevated.  WALK:      No data to display          Imaging: Personally reviewed and as per EMR and discussion as noted DG Abd 2 Views Result Date: 07/30/2024 CLINICAL DATA:  Chronic constipation. Abdominal pain. Gastroesophageal reflux. EXAM: ABDOMEN - 2 VIEW COMPARISON:  None Available. FINDINGS: No bowel dilatation or evidence of obstruction. Moderate stool within the ascending, transverse, and descending colon. Small volume of formed stool within the sigmoid colon. No abnormal rectal distention. No radiopaque calculi or abnormal soft tissue calcifications. Hemi  transitional lumbosacral anatomy. IMPRESSION: Moderate colonic stool burden. No bowel obstruction. Electronically Signed   By: Andrea Gasman M.D.   On: 07/30/2024 16:21   NM PET CT CARDIAC PERFUSION MULTI W/ABSOLUTE BLOODFLOW Result Date: 07/04/2024   Mildly reduced counts on rest and stress with normal wall motion and normal coronary flow reserve. Suspect this is artifact and this study is normal. No ischemia or infarction. Low-risk.   LV perfusion is normal. There is no evidence of ischemia. There is no evidence of infarction.   Rest left ventricular function is normal. Rest EF: 72%. Stress left ventricular function is normal. Stress EF: 72%. End diastolic cavity size is normal.   Myocardial blood flow was computed to be 1.40ml/g/min at rest and 3.81ml/g/min at stress. Global myocardial blood flow reserve was 2.51 and was normal.   Coronary calcium  was absent on the attenuation correction CT images.   The study is normal. The study is low risk.   Electronically signed by Christy Decent, MD EXAM: OVER-READ INTERPRETATION  CT CHEST The following report is a limited chest CT over-read performed by radiologist Dr. Elsie Ko [facility] on 07/04/2024. This over-read does not include interpretation of cardiac or coronary anatomy or pathology nor does it include evaluation of the PET data. The cardiac PET-CT interpretation by the cardiologist is attached. COMPARISON:  Chest radiographs 05/23/2024, cardiac CT 05/31/2022 and abdominal CT 01/28/2021. FINDINGS: Mediastinum/Nodes: No enlarged lymph nodes within the visualized mediastinum. Lungs/Pleura: There is no pleural effusion. The visualized lungs appear clear. Upper abdomen: Hepatic low density consistent with steatosis, similar to previous study. No acute abnormality. Musculoskeletal/Chest wall: No chest wall mass or suspicious osseous findings within the visualized chest on axial imaging. IMPRESSION: 1. No significant extracardiac findings  within the visualized chest. 2. Hepatic steatosis. Electronically Signed   By: Elsie Perone M.D.   On: 07/04/2024 14:47   Lab Results: Personally reviewed CBC    Component Value Date/Time   WBC 8.1 06/24/2024 1429   RBC 4.40 06/24/2024 1429   HGB 13.1 06/24/2024 1429   HGB 12.9 06/04/2019 0909   HCT 38.9 06/24/2024 1429   HCT 39.4 06/04/2019 0909   PLT 304.0 06/24/2024 1429   MCV 88.5 06/24/2024 1429   MCV 88 06/04/2019 0909   MCH 29.2 10/15/2020 0934   MCHC 33.7 06/24/2024 1429   RDW 13.1 06/24/2024 1429   RDW 13.5 06/04/2019 0909   LYMPHSABS 3.1 06/24/2024 1429   LYMPHSABS 1.8 06/04/2019 0909   MONOABS 0.4 06/24/2024 1429   EOSABS 0.1 06/24/2024 1429   EOSABS 0.1 06/04/2019 0909   BASOSABS 0.0 06/24/2024 1429   BASOSABS 0.1  06/04/2019 0909    BMET    Component Value Date/Time   NA 140 06/24/2024 1429   NA 140 06/24/2024 1129   K 3.9 06/24/2024 1429   CL 102 06/24/2024 1429   CO2 24 06/24/2024 1429   GLUCOSE 84 06/24/2024 1429   GLUCOSE 85 06/24/2024 1129   BUN 8 06/24/2024 1429   BUN 9 06/24/2024 1129   CREATININE 0.55 06/24/2024 1429   CREATININE 0.69 10/15/2020 0934   CALCIUM  9.2 06/24/2024 1429   GFRNONAA >60 07/13/2023 1813   GFRNONAA 110 10/15/2020 0934   GFRAA 128 10/15/2020 0934    BNP No results found for: BNP  ProBNP No results found for: PROBNP  Specialty Problems       Pulmonary Problems   Allergic rhinitis due to pollen   Laryngopharyngeal reflux   Recurrent sinusitis    Allergies  Allergen Reactions   Aspirin Swelling and Other (See Comments)    hoarseness   Chlorhexidine  Dermatitis and Rash   Esomeprazole Hives   Nsaids Other (See Comments)    Eyes swelling/loss of voice   Chlorhexidine  Gluconate Other (See Comments) and Rash   Esomeprazole Magnesium Rash   Iodinated Contrast Media Hives    Immunization History  Administered Date(s) Administered   Influenza,inj,quad, With Preservative 07/10/2018, 07/01/2019    Influenza-Unspecified 06/24/2015, 06/23/2016, 06/26/2017, 07/09/2018, 07/01/2019, 06/30/2020   PFIZER(Purple Top)SARS-COV-2 Vaccination 11/06/2019, 11/28/2019, 09/08/2020   Pfizer Covid-19 Vaccine Bivalent Booster 58yrs & up 09/09/2021   Pfizer(Comirnaty)Fall Seasonal Vaccine 12 years and older 07/01/2022, 07/03/2023   Td 02/04/2015   Tdap 02/04/2015   Tetanus 02/04/2015    Past Medical History:  Diagnosis Date   Abnormal cervical Papanicolaou smear 06/24/2024   2007 COLPO/ CRYO     Abnormal menstrual periods 01/17/2022   Allergy    Anxiety    Back pain at L4-L5 level    Blood transfusion without reported diagnosis 04/2005   Auto Transfusion Post Lumbar Laminectomy/Discectomy   BMI 30.0-30.9,adult    Chest pain    Constipation    Delayed pressure urticaria dx age 47   trauma to skin causes deep tissue  swelling   Depression    Dysphonia 07/03/2018   GERD (gastroesophageal reflux disease)    Hyperglycemia    Hyperlipidemia    Laryngopharyngeal reflux    Lower back pain    l4 to l5   Obesity    Pain in joint of left shoulder 02/10/2022   Right thyroid  nodule    Seasonal allergies    Thyroid  nodule 05/10/2016   Right lobe FNA negative. Followed by ENT S/P right thyroidectomy 5/19, U/S 12/04/19 negative no nodules, 02/05/21 negative/no nodules     TMJ (dislocation of temporomandibular joint)    Urticaria    Vitamin D  deficiency     Tobacco History: Social History   Tobacco Use  Smoking Status Never   Passive exposure: Never  Smokeless Tobacco Never   Counseling given: Not Answered   Continue to not smoke  Outpatient Encounter Medications as of 07/31/2024  Medication Sig   Vonoprazan Fumarate (VOQUEZNA) 10 MG TABS Take 10 mg by mouth daily.   albuterol  (VENTOLIN  HFA) 108 (90 Base) MCG/ACT inhaler Inhale 2 puffs into the lungs every 6 (six) hours as needed for wheezing or shortness of breath. (Patient not taking: Reported on 07/24/2024)   amLODipine  (NORVASC ) 2.5 MG  tablet Take 1 tablet (2.5 mg total) by mouth daily. (Patient taking differently: Take 5 mg by mouth daily. Takes 2 at bedtime)  Ascorbic Acid (VITAMIN C) 1000 MG tablet Take 1,000 mg by mouth at bedtime.   azelastine  (ASTELIN ) 0.1 % nasal spray Place 1 spray into both nostrils 2 (two) times daily. Use in each nostril as directed (Patient not taking: Reported on 07/24/2024)   budesonide -formoterol  (SYMBICORT ) 160-4.5 MCG/ACT inhaler Inhale 2 puffs into the lungs 2 (two) times daily. (Patient not taking: Reported on 07/24/2024)   cetirizine (ZYRTEC) 10 MG tablet Take 10 mg by mouth at bedtime.    diphenhydrAMINE  (BENADRYL ) 2 % cream Apply 1 application topically 3 (three) times daily as needed (for hives.).   diphenhydrAMINE  (BENADRYL ) 25 mg capsule Take 25-50 mg by mouth every 6 (six) hours as needed (for hives.).    fluconazole  (DIFLUCAN ) 150 MG tablet Take 1 tablet (150 mg total) by mouth every 3 (three) days.   fluticasone  (FLONASE ) 50 MCG/ACT nasal spray Place 2 sprays into both nostrils daily. (Patient not taking: Reported on 07/24/2024)   ketoconazole (NIZORAL) 2 % cream Apply to affected areas 1-2 times daily as needed for flares: mix with triamcinolone  cream   meloxicam  (MOBIC ) 7.5 MG tablet Take 1 tablet (7.5 mg total) by mouth as needed.   metFORMIN  (GLUCOPHAGE -XR) 500 MG 24 hr tablet TAKE 1 TABLET BY MOUTH ONCE DAILY   Naltrexone-buPROPion HCl ER (CONTRAVE) 8-90 MG TB12 Take 2 tablets by mouth 2 (two) times daily. (Patient not taking: Reported on 07/25/2024)   norethindrone -ethinyl estradiol -FE (JUNEL  FE 1/20) 1-20 MG-MCG tablet Take 1 tablet by mouth daily, taking contiously skipping placebo pills   norethindrone -ethinyl estradiol -FE (JUNEL  FE 1/20) 1-20 MG-MCG tablet Take 1 tablet by mouth daily, taking contiously skipping placebo pills   norethindrone -ethinyl estradiol -FE (JUNEL  FE 1/20) 1-20 MG-MCG tablet 1 tablet by mouth daily, taking contiously skipping placebo pills   nystatin   (MYCOSTATIN /NYSTOP ) powder APPLY TO THE AFFECTED AREA(S) 2 TIMES PER DAY AS DIRECTED.   omeprazole  (PRILOSEC) 40 MG capsule Take 1 capsule (40 mg total) by mouth 2 (two) times daily.   omeprazole  (PRILOSEC) 40 MG capsule Take 1 capsule (40 mg total) by mouth 2 (two) times daily.   rosuvastatin (CRESTOR) 10 MG tablet Take 1 tablet (10 mg total) by mouth daily.   triamcinolone  (KENALOG ) 0.025 % cream Apply to affected area daily for 2 weeks as needed for flares (mix with ketoconazole) not safe for long term use   VITAMIN D  PO Take 2,000 Units by mouth daily.   No facility-administered encounter medications on file as of 07/31/2024.     Review of Systems  Review of Systems  N/a Physical Exam  BP 132/80   Pulse (!) 116   Ht 5' 4 (1.626 m) Comment: per pft note / pt  Wt 240 lb 9.6 oz (109.1 kg)   LMP  (LMP Unknown)   SpO2 100%   BMI 41.30 kg/m   Wt Readings from Last 5 Encounters:  07/31/24 240 lb 9.6 oz (109.1 kg)  07/25/24 237 lb 6 oz (107.7 kg)  07/24/24 237 lb 3.2 oz (107.6 kg)  06/24/24 238 lb 12.8 oz (108.3 kg)  06/24/24 239 lb 6.4 oz (108.6 kg)    BMI Readings from Last 5 Encounters:  07/31/24 41.30 kg/m  07/25/24 40.75 kg/m  07/24/24 40.50 kg/m  06/24/24 40.99 kg/m  06/24/24 41.09 kg/m     Physical Exam General: Sitting in chair, no distress Eyes: No icterus Neck: No JVP Pulmonary: Clear, good air excursion, normal work of breathing, on room air Cardiovascular: Regular rate and rhythm Abdomen: Nondistended  Assessment &  Plan:   Chest discomfort, dyspnea on exertion: Dyspnea preceded chest discomfort for many years.  Chest tightness, discomfort with exertion over the last couple of years.  Chest x-ray clear.  Cardiology evaluation completed and reassuring including negative workup for ischemia.  PFT within normal limits.  Exercise-induced asthma remains possible.  Trial of albuterol  prior to exercise see if this improves dyspnea.  Her chest discomfort has  improved with different acid reflux medicine which begs a question of underlying reflux, mild esophagitis driving chest discomfort.  Allergies: Continue follow-up with allergist, upcoming skin test.   Return if symptoms worsen or fail to improve, for f/u Dr. Annella.   Donnice JONELLE Annella, MD 07/31/2024

## 2024-07-31 NOTE — Patient Instructions (Signed)
 Full pft performed today

## 2024-08-01 ENCOUNTER — Ambulatory Visit: Admitting: Allergy & Immunology

## 2024-08-02 ENCOUNTER — Other Ambulatory Visit (HOSPITAL_BASED_OUTPATIENT_CLINIC_OR_DEPARTMENT_OTHER): Payer: Self-pay

## 2024-08-02 MED ORDER — VOQUEZNA 10 MG PO TABS
1.0000 | ORAL_TABLET | Freq: Every day | ORAL | 1 refills | Status: DC
  Filled 2024-08-02: qty 30, 30d supply, fill #0
  Filled 2024-09-02: qty 30, 30d supply, fill #1

## 2024-08-04 ENCOUNTER — Encounter: Payer: Self-pay | Admitting: Cardiovascular Disease

## 2024-08-05 ENCOUNTER — Telehealth (HOSPITAL_COMMUNITY): Payer: Self-pay

## 2024-08-05 ENCOUNTER — Other Ambulatory Visit (HOSPITAL_COMMUNITY): Payer: Self-pay

## 2024-08-05 ENCOUNTER — Telehealth: Payer: Self-pay

## 2024-08-05 MED ORDER — AMLODIPINE BESYLATE 5 MG PO TABS
5.0000 mg | ORAL_TABLET | Freq: Every day | ORAL | 3 refills | Status: AC
  Filled 2024-08-05: qty 90, 90d supply, fill #0

## 2024-08-05 NOTE — Telephone Encounter (Signed)
 Pharmacy Patient Advocate Encounter   Received notification from Pt Calls Messages that prior authorization for Voquezna 10MG  tablets is required/requested.   Insurance verification completed.   The patient is insured through [facility].   Per test claim: PA required; PA submitted to above mentioned insurance via Latent Key/confirmation #/EOC [id number] Status is pending

## 2024-08-05 NOTE — Telephone Encounter (Signed)
 Per 10/6/ phone note  ADDENDUM: PT called stating she was having elevated BP. I increased her amlodipine  to 5 mg over the phone

## 2024-08-05 NOTE — Telephone Encounter (Signed)
 Pharmacy Patient Advocate Encounter  Received notification from [facility] that Prior Authorization for Voquezna 10MG  tablets has been APPROVED from 08-05-2024 to 05-05-2025. Ran test claim, Copay is $0.00 for a 30 day supply. This test claim was processed through [facility]- copay amounts may vary at other pharmacies due to pharmacy/plan contracts, or as the patient moves through the different stages of their insurance plan.   PA #/Case ID/Reference #: [id number]

## 2024-08-06 ENCOUNTER — Other Ambulatory Visit: Payer: Self-pay

## 2024-08-07 ENCOUNTER — Ambulatory Visit: Admitting: Allergy & Immunology

## 2024-08-13 ENCOUNTER — Encounter: Payer: Self-pay | Admitting: Allergy & Immunology

## 2024-08-13 ENCOUNTER — Ambulatory Visit (INDEPENDENT_AMBULATORY_CARE_PROVIDER_SITE_OTHER): Admitting: Allergy & Immunology

## 2024-08-13 DIAGNOSIS — L509 Urticaria, unspecified: Secondary | ICD-10-CM

## 2024-08-13 DIAGNOSIS — J3089 Other allergic rhinitis: Secondary | ICD-10-CM

## 2024-08-13 DIAGNOSIS — J302 Other seasonal allergic rhinitis: Secondary | ICD-10-CM | POA: Diagnosis not present

## 2024-08-13 DIAGNOSIS — K219 Gastro-esophageal reflux disease without esophagitis: Secondary | ICD-10-CM

## 2024-08-13 DIAGNOSIS — L239 Allergic contact dermatitis, unspecified cause: Secondary | ICD-10-CM

## 2024-08-13 NOTE — Progress Notes (Signed)
 FOLLOW UP  Date of Service/Encounter:  08/13/24   Assessment:   Pressure urticaria   Gastroesophageal reflux disease - doing better on Voquenza today   Perennial and seasonal allergic rhinitis (grasses, ragweed, weeds, trees, indoor molds, and dust mites)   Allergic contact dermatitis - consider patch testing   Moderate persistent asthma, uncomplicated - with normal spirometry today  Plan/Recommendations:   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 - Testing was notable for elevated CRP which seems to be your baseline. - Xolair consent signed today. - In the meantime, continue with suppressive doses of antihistamines (Zyrtec).  - Copy of testing results provided today.   2. Gastroesophageal reflux disease - Continue with Voquenza daily.  - I look forward to see what the EGD shows.   3. Chronic rhinitis - Testing today showed: grasses, ragweed, weeds, trees, indoor molds, and dust mites - Copy of test results provided.  - Avoidance measures provided. - Continue with: Zyrtec (cetirizine) 10mg  tablet twice daily - Could consider allergy  shots for long-term control (can be done in conjunction with the Xolair). - If you have met your deductible, it might be a good time to start them. - If your hives are triggered by environmental factors like pollens etc, allergy  shots could be helpful.   4. Allergic contact dermatitis - We can consider patch testing in the future if you are interested.  - These are placed on a Monday and then read as a Wednesday and Friday.   5. Return in about 6 weeks (around 09/24/2024). You can have the follow up appointment with Dr. Iva or a Nurse Practicioner (our Nurse Practitioners are excellent and always have Physician oversight!).   Subjective:   Christy Alexander is a 43 y.o. female presenting today for follow up of No chief complaint  on file.   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   Polyuria 06/24/2024   Polydipsia 06/24/2024   Chest discomfort 06/24/2024   Tinnitus of left ear 01/23/2024   Recurrent sinusitis 01/09/2024   Arthralgia of left knee 07/03/2023   Elevated LDL cholesterol level 06/28/2023   Prediabetes 06/28/2023   NAFLD (nonalcoholic fatty liver disease) 91/97/7975   Herniated nucleus pulposus, L4-5 right 06/23/2022   Low back pain 04/06/2022   Shoulder pain 10/20/2021   Microscopic hematuria 07/21/2020   Urge incontinence 07/21/2020   Referred otalgia of both ears 10/31/2019   Temporomandibular joint (TMJ) pain 10/31/2019   Double vision 06/04/2019   Paresthesia 06/04/2019   H/O partial thyroidectomy 07/03/2018   Vitamin D  deficiency 06/26/2018   Elevated hemoglobin A1c 06/26/2018   Hyperlipidemia 11/21/2016   Elevated C-reactive protein 06/30/2016   Arthralgia of multiple joints 06/30/2016   Laryngopharyngeal reflux 05/10/2016   Morbid obesity (HCC) 05/10/2016   Seasonal allergies 05/10/2016   AP (abdominal pain) 03/18/2016   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   Chronic constipation 03/08/2016   Fissure in skin 03/08/2016   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   Drug allergy  01/26/2016   Idiopathic urticaria 01/26/2016   Allergic rhinitis due to pollen 01/26/2016   Arthritis 01/26/2016   Anxiety 11/10/2015   Elevated sed rate 04/20/2015   Degenerative disc disease, lumbar 01/17/2005    History obtained from: chart review and patient.  Discussed the use of AI scribe software for clinical note transcription with the patient and/or  guardian, who gave verbal consent to proceed.  Christy Alexander is a 43 y.o. female presenting for skin testing. She was last seen on November 5th. We could not do testing because her insurance company does not cover testing on the same day as a New Patient visit. She has  been off of all antihistamines 3 days in anticipation of the testing.   She has a history of hives that cause swelling of her hands. She brought pictures from 2007 to show the extent of the hives, noting that she stopped taking pictures as it became less novel. She has not experienced a recent episode of hives.  She has environmental allergies, particularly to grass, as she recalls an incident after moving to [place]  where lying in the grass caused a breakout on the back of her legs. She also experiences sneezing, which she notes has been occurring since her surgery. She does not have dust mite coverage on her bed.  All food allergy  tests were negative.   Otherwise, there have been no changes to her past medical history, surgical history, family history, or social history.    Review of systems otherwise negative other than that mentioned in the HPI.    Objective:   There were no vitals taken for this visit. There is no height or weight on file to calculate BMI.    Physical exam deferred since this was a skin testing appointment only.   Diagnostic studies:   Allergy  Studies:     Airborne Adult Perc - 08/13/24 1100     Time Antigen Placed 1100    Allergen Manufacturer Jestine    Location Back    Number of Test 55    Panel 1 Select    1. Control-Buffer 50% Glycerol Negative    2. Control-Histamine 2+    3. Bahia Negative    4. Bermuda Negative    5. Johnson Negative    6. [place]  Blue 3+    7. Meadow Fescue 2+    8. Perennial Rye 4+    9. Timothy 3+    10. Ragweed Mix Negative    11. Cocklebur 2+    12. Plantain,  English 2+    13. Baccharis 2+    14. Dog Fennel 2+    15. Russian Thistle 2+    16. Lamb's Quarters 2+    17. Sheep Sorrell Negative    18. Rough Pigweed Negative    19. Marsh Elder, Rough Negative    20. Mugwort, Common 2+    21. Box, Elder Negative    22. Cedar, red Negative    23. Sweet Gum 2+    24. Pecan Pollen 3+    25. Pine Mix 3+    26.  Walnut, Black Pollen 3+    27. Red Mulberry Negative    28. Ash Mix Negative    29. Birch Mix Negative    30. Beech American Negative    31. Cottonwood, Eastern 2+    32. Hickory, Dilday 3+    33. Maple Mix 3+    34. Oak, Eastern Mix 2+    35. Sycamore Eastern Negative    36. Alternaria Alternata Negative    37. Cladosporium Herbarum Negative    38. Aspergillus Mix Negative    39. Penicillium Mix Negative    40. Bipolaris Sorokiniana (Helminthosporium) Negative    41. Drechslera Spicifera (Curvularia) Negative    42. Mucor Plumbeus Negative    43. Fusarium Moniliforme Negative    44. Aureobasidium  Pullulans (pullulara) Negative    45. Rhizopus Oryzae Negative    46. Botrytis Cinera Negative    47. Epicoccum Nigrum Negative    48. Phoma Betae Negative    49. Dust Mite Mix 4+    50. Cat Hair 10,000 BAU/ml Negative    51.  Dog Epithelia Negative    52. Mixed Feathers Negative    53. Horse Epithelia Negative    54. Cockroach, German Negative    55. Tobacco Leaf Negative          Intradermal - 08/13/24 1137     Time Antigen Placed 1145    Allergen Manufacturer Greer    Location Arm    Number of Test 12    Control Negative    Bahia Negative    Bermuda Negative    Johnson Negative    Ragweed Mix 2+    Mold 1 Negative    Mold 2 3+    Mold 3 Negative    Mold 4 3+    Cat Negative    Dog Negative    Cockroach Negative          Food Adult Perc - 08/13/24 1100     Time Antigen Placed 1100    Allergen Manufacturer Greer    Location Back    Number of allergen test 5    30. Barley Negative    31. Rye  Negative    32. Hops Negative    38. Tomato Negative    66. Chocolate/Cacao Bean Negative          Allergy  testing results were read and interpreted by myself, documented by clinical staff.      Marty Shaggy, MD  [facility] 

## 2024-08-13 NOTE — Patient Instructions (Addendum)
 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 - Testing was notable for elevated CRP which seems to be your baseline. - Xolair consent signed today. - In the meantime, continue with suppressive doses of antihistamines (Zyrtec).  - Copy of testing results provided today.   2. Gastroesophageal reflux disease - Continue with Voquenza daily.  - I look forward to see what the EGD shows.   3. Chronic rhinitis - Testing today showed: grasses, ragweed, weeds, trees, indoor molds, and dust mites - Copy of test results provided.  - Avoidance measures provided. - Continue with: Zyrtec (cetirizine) 10mg  tablet twice daily - Could consider allergy  shots for long-term control (can be done in conjunction with the Xolair). - If you have met your deductible, it might be a good time to start them. - If your hives are triggered by environmental factors like pollens etc, allergy  shots could be helpful.   4. Allergic contact dermatitis - We can consider patch testing in the future if you are interested.  - These are placed on a Monday and then read as a Wednesday and Friday.   5. Return in about 6 weeks (around 09/24/2024). You can have the follow up appointment with Dr. Iva or a Nurse Practicioner (our Nurse Practitioners are excellent and always have Physician oversight!).    Please inform us  of any Emergency Department visits, hospitalizations, or changes in symptoms. Call us  before going to the ED for breathing or allergy  symptoms since we might be able to fit you in for a sick visit. Feel free to contact us  anytime with any questions, problems, or concerns.  It was a pleasure to meet you today!  Websites that have reliable patient information: 1. American Academy of Asthma, Allergy , and Immunology: www.aaaai.org 2. Food Allergy  Research and Education (FARE): foodallergy.org 3. Mothers of  Asthmatics: http://www.asthmacommunitynetwork.org 4. American College of Allergy , Asthma, and Immunology: www.acaai.org      "Like" us  on Facebook and Instagram for our latest updates!      A healthy democracy works best when [employer] participate! Make sure you are registered to vote! If you have moved or changed any of your contact information, you will need to get this updated before voting! Scan the QR codes below to learn more!        Airborne Adult Perc - 08/13/24 1100     Time Antigen Placed 1100    Allergen Manufacturer Greer    Location Back    Number of Test 55    Panel 1 Select    1. Control-Buffer 50% Glycerol Negative    2. Control-Histamine 2+    3. Bahia Negative    4. Bermuda Negative    5. Johnson Negative    6. [place]  Blue 3+    7. Meadow Fescue 2+    8. Perennial Rye 4+    9. Timothy 3+    10. Ragweed Mix Negative    11. Cocklebur 2+    12. Plantain,  English 2+    13. Baccharis 2+    14. Dog Fennel 2+    15. Russian Thistle 2+    16. Lamb's Quarters 2+    17. Sheep Sorrell Negative    18. Rough Pigweed Negative    19. Marsh Elder, Rough Negative    20. Mugwort, Common 2+    21. Box, Elder Negative    22. Cedar, red Negative  23. Sweet Gum 2+    24. Pecan Pollen 3+    25. Pine Mix 3+    26. Walnut, Black Pollen 3+    27. Red Mulberry Negative    28. Ash Mix Negative    29. Birch Mix Negative    30. Beech American Negative    31. Cottonwood, Eastern 2+    32. Hickory, Foushee 3+    33. Maple Mix 3+    34. Oak, Eastern Mix 2+    35. Sycamore Eastern Negative    36. Alternaria Alternata Negative    37. Cladosporium Herbarum Negative    38. Aspergillus Mix Negative    39. Penicillium Mix Negative    40. Bipolaris Sorokiniana (Helminthosporium) Negative    41. Drechslera Spicifera (Curvularia) Negative    42. Mucor Plumbeus Negative    43. Fusarium Moniliforme Negative    44. Aureobasidium Pullulans (pullulara) Negative    45.  Rhizopus Oryzae Negative    46. Botrytis Cinera Negative    47. Epicoccum Nigrum Negative    48. Phoma Betae Negative    49. Dust Mite Mix 4+    50. Cat Hair 10,000 BAU/ml Negative    51.  Dog Epithelia Negative    52. Mixed Feathers Negative    53. Horse Epithelia Negative    54. Cockroach, German Negative    55. Tobacco Leaf Negative          Intradermal - 08/13/24 1137     Time Antigen Placed 1145    Allergen Manufacturer Greer    Location Arm    Number of Test 12    Control Negative    Bahia Negative    Bermuda Negative    Johnson Negative    Ragweed Mix 2+    Mold 1 Negative    Mold 2 3+    Mold 3 Negative    Mold 4 3+    Cat Negative    Dog Negative    Cockroach Negative          Food Adult Perc - 08/13/24 1100     Time Antigen Placed 1100    Allergen Manufacturer Greer    Location Back    Number of allergen test 5    30. Barley Negative    31. Rye  Negative    32. Hops Negative    38. Tomato Negative    66. Chocolate/Cacao Bean Negative          Reducing Pollen Exposure  The American Academy of Allergy , Asthma and Immunology suggests the following steps to reduce your exposure to pollen during allergy  seasons.    Do not hang sheets or clothing out to dry; pollen may collect on these items. Do not mow lawns or spend time around freshly cut grass; mowing stirs up pollen. Keep windows closed at night.  Keep car windows closed while driving. Minimize morning activities outdoors, a time when pollen counts are usually at their highest. Stay indoors as much as possible when pollen counts or humidity is high and on windy days when pollen tends to remain in the air longer. Use air conditioning when possible.  Many air conditioners have filters that trap the pollen spores. Use a HEPA room air filter to remove pollen form the indoor air you breathe.  Control of Mold Allergen   Mold and fungi can grow on a variety of surfaces provided certain temperature  and moisture conditions exist.  Outdoor molds grow on plants, decaying vegetation and  soil.  The major outdoor mold, Alternaria and Cladosporium, are found in very high numbers during hot and dry conditions.  Generally, a late Summer - Fall peak is seen for common outdoor fungal spores.  Rain will temporarily lower outdoor mold spore count, but counts rise rapidly when the rainy period ends.  The most important indoor molds are Aspergillus and Penicillium.  Dark, humid and poorly ventilated basements are ideal sites for mold growth.  The next most common sites of mold growth are the bathroom and the kitchen.   Indoor (Perennial) Mold Control   Positive indoor molds via skin testing: Aspergillus, Penicillium, Fusarium, Aureobasidium (Pullulara), and Rhizopus  Maintain humidity below 50%. Clean washable surfaces with 5% bleach solution. Remove sources e.g. contaminated carpets.    Control of Dust Mite Allergen    Dust mites play a major role in allergic asthma and rhinitis.  They occur in environments with high humidity wherever human skin is found.  Dust mites absorb humidity from the atmosphere (ie, they do not drink) and feed on organic matter (including shed human and animal skin).  Dust mites are a microscopic type of insect that you cannot see with the naked eye.  High levels of dust mites have been detected from mattresses, pillows, carpets, upholstered furniture, bed covers, clothes, soft toys and any woven material.  The principal allergen of the dust mite is found in its feces.  A gram of dust may contain 1,000 mites and 250,000 fecal particles.  Mite antigen is easily measured in the air during house cleaning activities.  Dust mites do not bite and do not cause harm to humans, other than by triggering allergies/asthma.    Ways to decrease your exposure to dust mites in your home:  Encase mattresses, box springs and pillows with a mite-impermeable barrier or cover   Wash sheets, blankets  and drapes weekly in hot water (130 F) with detergent and dry them in a dryer on the hot setting.  Have the room cleaned frequently with a vacuum cleaner and a damp dust-mop.  For carpeting or rugs, vacuuming with a vacuum cleaner equipped with a high-efficiency particulate air (HEPA) filter.  The dust mite allergic individual should not be in a room which is being cleaned and should wait 1 hour after cleaning before going into the room. Do not sleep on upholstered furniture (eg, couches).   If possible removing carpeting, upholstered furniture and drapery from the home is ideal.  Horizontal blinds should be eliminated in the rooms where the person spends the most time (bedroom, study, television room).  Washable vinyl, roller-type shades are optimal. Remove all non-washable stuffed toys from the bedroom.  Wash stuffed toys weekly like sheets and blankets above.   Reduce indoor humidity to less than 50%.  Inexpensive humidity monitors can be purchased at most hardware stores.  Do not use a humidifier as can make the problem worse and are not recommended.  Allergy  Shots  Allergies are the result of a chain reaction that starts in the immune system. Your immune system controls how your body defends itself. For instance, if you have an allergy  to pollen, your immune system identifies pollen as an invader or allergen. Your immune system overreacts by producing antibodies called Immunoglobulin E (IgE). These antibodies travel to cells that release chemicals, causing an allergic reaction.  The concept behind allergy  immunotherapy, whether it is received in the form of shots or tablets, is that the immune system can be desensitized to specific allergens  that trigger allergy  symptoms. Although it requires time and patience, the payback can be long-term relief. Allergy  injections contain a dilute solution of those substances that you are allergic to based upon your skin testing and allergy  history.   How Do  Allergy  Shots Work?  Allergy  shots work much like a vaccine. Your body responds to injected amounts of a particular allergen given in increasing doses, eventually developing a resistance and tolerance to it. Allergy  shots can lead to decreased, minimal or no allergy  symptoms.  There generally are two phases: build-up and maintenance. Build-up often ranges from three to six months and involves receiving injections with increasing amounts of the allergens. The shots are typically given once or twice a week, though more rapid build-up schedules are sometimes used.  The maintenance phase begins when the most effective dose is reached. This dose is different for each person, depending on how allergic you are and your response to the build-up injections. Once the maintenance dose is reached, there are longer periods between injections, typically two to four weeks.  Occasionally doctors give cortisone-type shots that can temporarily reduce allergy  symptoms. These types of shots are different and should not be confused with allergy  immunotherapy shots.  Who Can Be Treated with Allergy  Shots?  Allergy  shots may be a good treatment approach for people with allergic rhinitis (hay fever), allergic asthma, conjunctivitis (eye allergy ) or stinging insect allergy .   Before deciding to begin allergy  shots, you should consider:   The length of allergy  season and the severity of your symptoms  Whether medications and/or changes to your environment can control your symptoms  Your desire to avoid long-term medication use  Time: allergy  immunotherapy requires a major time commitment  Cost: may vary depending on your insurance coverage  Allergy  shots for children age 52 and older are effective and often well tolerated. They might prevent the onset of new allergen sensitivities or the progression to asthma.  Allergy  shots are not started on patients who are pregnant but can be continued on patients who become  pregnant while receiving them. In some patients with other medical conditions or who take certain common medications, allergy  shots may be of risk. It is important to mention other medications you talk to your allergist.   What are the two types of build-ups offered:   RUSH or Rapid Desensitization -- one day of injections lasting from 8:30-4:30pm, injections every 1 hour.  Approximately half of the build-up process is completed in that one day.  The following week, normal build-up is resumed, and this entails ~16 visits either weekly or twice weekly, until reaching your "maintenance dose" which is continued weekly until eventually getting spaced out to every month for a duration of 3 to 5 years. The regular build-up appointments are nurse visits where the injections are administered, followed by required monitoring for 30 minutes.    Traditional build-up -- weekly visits for 6 -12 months until reaching "maintenance dose", then continue weekly until eventually spacing out to every 4 weeks as above. At these appointments, the injections are administered, followed by required monitoring for 30 minutes.     Either way is acceptable, and both are equally effective. With the rush protocol, the advantage is that less time is spent here for injections overall AND you would also reach maintenance dosing faster (which is when the clinical benefit starts to become more apparent). Not everyone is a candidate for rapid desensitization.   IF we proceed with the RUSH protocol, there are  premedications which must be taken the day before and the day after the rush only (this includes antihistamines, steroids, and Singulair).  After the rush day, no prednisone  or Singulair is required, and we just recommend antihistamines taken on your injection day.  What Is An Estimate of the Costs?  If you are interested in starting allergy  injections, please check with your insurance company about your coverage for both allergy   vial sets and allergy  injections.  Please do so prior to making the appointment to start injections.  The following are CPT codes to give to your insurance company. These are the amounts we BILL to the insurance company, but the amount YOU WILL PAY and WE RECEIVE IS SUBSTANTIALLY LESS and depends on the contracts we have with different insurance companies.   Amount Billed to Insurance One allergy  vial set  CPT 95165   $ 1200     Two allergy  vial set  CPT 95165   $ 2400     Three allergy  vial set  CPT 95165   $ 3600     One injection   CPT 95115   $ 35  Two injections   CPT 95117   $ 40 RUSH (Rapid Desensitization) CPT 95180 x 8 hours $500/hour  Regarding the allergy  injections, your co-pay may or may not apply with each injection, so please confirm this with your insurance company. When you start allergy  injections, 1 or 2 sets of vials are made based on your allergies.  Not all patients can be on one set of vials. A set of vials lasts 6 months to a year depending on how quickly you can proceed with your build-up of your allergy  injections. Vials are personalized for each patient depending on their specific allergens.  How often are allergy  injection given during the build-up period?   Injections are given at least weekly during the build-up period until your maintenance dose is achieved. Per the doctor's discretion, you may have the option of getting allergy  injections two times per week during the build-up period. However, there must be at least 48 hours between injections. The build-up period is usually completed within 6-12 months depending on your ability to schedule injections and for adjustments for reactions. When maintenance dose is reached, your injection schedule is gradually changed to every two weeks and later to every three weeks. Injections will then continue every 4 weeks. Usually, injections are continued for a total of 3-5 years.   When Will I Feel Better?  Some may experience  decreased allergy  symptoms during the build-up phase. For others, it may take as long as 12 months on the maintenance dose. If there is no improvement after a year of maintenance, your allergist will discuss other treatment options with you.  If you aren't responding to allergy  shots, it may be because there is not enough dose of the allergen in your vaccine or there are missing allergens that were not identified during your allergy  testing. Other reasons could be that there are high levels of the allergen in your environment or major exposure to non-allergic triggers like tobacco smoke.  What Is the Length of Treatment?  Once the maintenance dose is reached, allergy  shots are generally continued for three to five years. The decision to stop should be discussed with your allergist at that time. Some people may experience a permanent reduction of allergy  symptoms. Others may relapse and a longer course of allergy  shots can be considered.  What Are the Possible Reactions?  The  two types of adverse reactions that can occur with allergy  shots are local and systemic. Common local reactions include very mild redness and swelling at the injection site, which can happen immediately or several hours after. Report a delayed reaction from your last injection. These include arm swelling or runny nose, watery eyes or cough that occurs within 12-24 hours after injection. A systemic reaction, which is less common, affects the entire body or a particular body system. They are usually mild and typically respond quickly to medications. Signs include increased allergy  symptoms such as sneezing, a stuffy nose or hives.   Rarely, a serious systemic reaction called anaphylaxis can develop. Symptoms include swelling in the throat, wheezing, a feeling of tightness in the chest, nausea or dizziness. Most serious systemic reactions develop within 30 minutes of allergy  shots. This is why it is strongly recommended you wait in your  doctor's office for 30 minutes after your injections. Your allergist is trained to watch for reactions, and his or her staff is trained and equipped with the proper medications to identify and treat them.   Report to the nurse immediately if you experience any of the following symptoms: swelling, itching or redness of the skin, hives, watery eyes/nose, breathing difficulty, excessive sneezing, coughing, stomach pain, diarrhea, or light headedness. These symptoms may occur within 15-20 minutes after injection and may require medication.   Who Should Administer Allergy  Shots?  The preferred location for receiving shots is your prescribing allergist's office. Injections can sometimes be given at another facility where the physician and staff are trained to recognize and treat reactions, and have received instructions by your prescribing allergist.  What if I am late for an injection?   Injection dose will be adjusted depending upon how many days or weeks you are late for your injection.   What if I am sick?   Please report any illness to the nurse before receiving injections. She may adjust your dose or postpone injections depending on your symptoms. If you have fever, flu, sinus infection or chest congestion it is best to postpone allergy  injections until you are better. Never get an allergy  injection if your asthma is causing you problems. If your symptoms persist, seek out medical care to get your health problem under control.  What If I am or Become Pregnant:  Women that become pregnant should schedule an appointment with [facility] before receiving any further allergy  injections.

## 2024-08-19 NOTE — Progress Notes (Unsigned)
 Cardiology Office Note:    Date:  08/28/2024   ID:  Christy Alexander, DOB [birth year], MRN [id number]  PCP:  Jesus Bernardino MATSU, MD   [facility] Providers Cardiologist:  Lonni Cash, MD Cardiology APP:  Madie Jon Garre, [place]     Referring MD: Jesus Bernardino MATSU, MD   Chief Complaint  Patient presents with   Follow-up    HTN    History of Present Illness:    Christy Alexander is a 43 y.o. female with a hx of anxiety, GERD, and hyperlipidemia.  She has been followed for chest pain evaluation in 2023.  Coronary CTA 05/31/2022 showed a coronary calcium  score of 0 and no evidence of CAD.  Follow-up echocardiogram 05/2022 with preserved LVEF 60-65% and no valvular disease.  She has a history of persistently high CRP levels for nearly 10 years with no clear explanation despite multiple referrals to rheumatology.  She questions exercise-induced asthma, has not followed with pulmonology.  I saw her on 04/12/2024 for routine follow-up and also chest pain.  She also experienced progressively increasing BP readings at home.  She also reported burping and chest burning sensation during walks that she felt was likely related to GERD.  I opted to repeat an echocardiogram which revealed preserved BiV function and no significant valvular disease.  I started 2.5 mg amlodipine  at night, which she then increased to 5 mg daily. She continued to have chest pain and a PET stress test was completed that did not show any evidence of ischemia.  She presents for cardiology follow-up. No further chest pain since starting new GERD medication. She has lost 12 lbs natrually and planning to start zepbound  in January. She is doing well on the amlodipine , but has noticed some finger swelling upon waking over the last 1-2 weeks. They are eating more at home, less sodium. We entertained switching amlodipine  to ARB, she will continue to monitor for now.    Past Medical History:  Diagnosis Date   Abnormal  cervical Papanicolaou smear 06/24/2024   2007 COLPO/ CRYO     Abnormal menstrual periods 01/17/2022   Allergy     Anxiety    Back pain at L4-L5 level    Blood transfusion without reported diagnosis 04/2005   Auto Transfusion Post Lumbar Laminectomy/Discectomy   BMI 30.0-30.9,adult    Chest pain    Constipation    Delayed pressure urticaria dx age 43   trauma to skin causes deep tissue  swelling   Depression    Dysphonia 07/03/2018   GERD (gastroesophageal reflux disease)    Hyperglycemia    Hyperlipidemia    Laryngopharyngeal reflux    Lower back pain    l4 to l5   Obesity    Pain in joint of left shoulder 02/10/2022   Right thyroid  nodule    Seasonal allergies    Thyroid  nodule 05/10/2016   Right lobe FNA negative. Followed by ENT S/P right thyroidectomy 5/19, U/S 12/04/19 negative no nodules, 02/05/21 negative/no nodules     TMJ (dislocation of temporomandibular joint)    Urticaria    Vitamin D  deficiency     Past Surgical History:  Procedure Laterality Date   LUMBAR LAMINECTOMY  2006   L4 and L5 discectomy micro, hemilaminectomy, had conscious sedation for    SPINE SURGERY  04/2005   Micro L4-L5 Lumbar Laminectomy/Discectomy   THYROID  LOBECTOMY Right 02/01/2018   Procedure: RIGHT THYROID  LOBECTOMY;  Surgeon: Eletha Boas, MD;  Location: [facility];  Service: General;  Laterality:  Right;   WISDOM TOOTH EXTRACTION  2001    Current Medications: Current Meds  Medication Sig   albuterol  (VENTOLIN  HFA) 108 (90 Base) MCG/ACT inhaler Inhale 2 puffs into the lungs every 6 (six) hours as needed for wheezing or shortness of breath.   amLODipine  (NORVASC ) 5 MG tablet Take 1 tablet (5 mg total) by mouth daily.   Ascorbic Acid (VITAMIN C) 1000 MG tablet Take 1,000 mg by mouth at bedtime.   cetirizine (ZYRTEC) 10 MG tablet Take 10 mg by mouth at bedtime.    diphenhydrAMINE  (BENADRYL ) 2 % cream Apply 1 application topically 3 (three) times daily as needed (for hives.).    diphenhydrAMINE  (BENADRYL ) 25 mg capsule Take 25-50 mg by mouth every 6 (six) hours as needed (for hives.).    ketoconazole  (NIZORAL ) 2 % cream Apply to affected areas 1-2 times daily as needed for flares: mix with triamcinolone  cream   meloxicam  (MOBIC ) 7.5 MG tablet Take 1 tablet (7.5 mg total) by mouth as needed.   metFORMIN  (GLUCOPHAGE -XR) 500 MG 24 hr tablet TAKE 1 TABLET BY MOUTH ONCE DAILY   norethindrone -ethinyl estradiol -FE (JUNEL  FE 1/20) 1-20 MG-MCG tablet Take 1 tablet by mouth daily, taking contiously skipping placebo pills   norethindrone -ethinyl estradiol -FE (JUNEL  FE 1/20) 1-20 MG-MCG tablet Take 1 tablet by mouth daily, taking contiously skipping placebo pills   nystatin  (MYCOSTATIN /NYSTOP ) powder APPLY TO THE AFFECTED AREA(S) 2 TIMES PER DAY AS DIRECTED.   tacrolimus  (PROTOPIC ) 0.1 % ointment Apply to affected areas twice daily for 2 to 4 weeks as needed for flares   triamcinolone  (KENALOG ) 0.025 % cream Apply to affected area daily for 2 weeks as needed for flares (mix with ketoconazole ) not safe for long term use   VITAMIN D  PO Take 2,000 Units by mouth daily.   Vonoprazan Fumarate  (VOQUEZNA ) 10 MG TABS Take 1 tablet by mouth daily.     Allergies:   Aspirin, Chlorhexidine , Esomeprazole, Nsaids, Chlorhexidine  gluconate, Esomeprazole magnesium, and Iodinated contrast media   Social History   Socioeconomic History   Marital status: Married    Spouse name: Not on file   Number of children: 0   Years of education: college   Highest education level: Master's degree (e.g., MA, MS, MEng, MEd, MSW, MBA)  Occupational History   Occupation: [employer]   Occupation: Engineer, Civil (consulting) works for [employer]  Tobacco Use   Smoking status: Never    Passive exposure: Never   Smokeless tobacco: Never  Vaping Use   Vaping status: Never Used  Substance and Sexual Activity   Alcohol use: No   Drug use: No   Sexual activity: Not Currently    Birth control/protection: Pill  Other Topics Concern   Not on  file  Social History Narrative   Lives at home with her husband.   Right-handed.   1 cup coffee per day.   Social Drivers of Corporate Investment Banker Strain: Low Risk  (06/23/2024)   Overall Financial Resource Strain (CARDIA)    Difficulty of Paying Living Expenses: Not hard at all  Food Insecurity: No Food Insecurity (06/23/2024)   Hunger Vital Sign    Worried About Running Out of Food in the Last Year: Never true    Ran Out of Food in the Last Year: Never true  Transportation Needs: No Transportation Needs (06/23/2024)   PRAPARE - Administrator, Civil Service (Medical): No    Lack of Transportation (Non-Medical): No  Physical Activity: Inactive (06/23/2024)   Exercise Vital  Sign    Days of Exercise per Week: 0 days    Minutes of Exercise per Session: Not on file  Stress: Stress Concern Present (06/23/2024)   [employer] of Occupational Health - Occupational Stress Questionnaire    Feeling of Stress: Rather much  Social Connections: Socially Isolated (06/23/2024)   Social Connection and Isolation Panel    Frequency of Communication with Friends and Family: Once a week    Frequency of Social Gatherings with Friends and Family: Once a week    Attends Religious Services: Never    Database Administrator or Organizations: No    Attends Engineer, Structural: Not on file    Marital Status: Married     Family History: The patient's family history includes Anxiety disorder in her brother, brother, and mother; Cancer in her father; Cataracts in her brother; Colon cancer in her paternal grandmother; Depression in her brother, brother, and mother; Diabetes in her brother, mother, and paternal grandfather; Diabetes type II in her brother and mother; Glaucoma in her father; Hearing loss in her mother; Heart attack in her maternal grandmother; Heart disease in her father, maternal grandmother, mother, and paternal grandfather; Hyperlipidemia in her brother, father, and  mother; Hypertension in her brother, brother, father, maternal grandmother, mother, and paternal grandfather; Kidney disease in her mother; Kidney failure in her mother; Obesity in her brother, father, and mother; Other in her maternal grandfather; Sleep apnea in her mother; Tongue cancer in her father; Varicose Veins in her mother. There is no history of Allergic rhinitis, Angioedema, Asthma, Eczema, Immunodeficiency, or Urticaria.  ROS:   Please see the history of present illness.     All other systems reviewed and are negative.  EKGs/Labs/Other Studies Reviewed:    The following studies were reviewed today:       Recent Labs: 06/24/2024: ALT 24; BUN 8; Creatinine, Ser 0.55; Hemoglobin 13.1; Platelets 304.0; Potassium 3.9; Sodium 140  Recent Lipid Panel    Component Value Date/Time   CHOL 203 (H) 06/24/2024 1429   CHOL 211 (H) 06/24/2024 1129   TRIG 191.0 (H) 06/24/2024 1429   HDL 50.60 06/24/2024 1429   HDL 50 06/24/2024 1129   CHOLHDL 4 06/24/2024 1429   CHOLHDL 4.2 06/24/2024 1129   VLDL 38.2 06/24/2024 1429   LDLCALC 115 (H) 06/24/2024 1429   LDLCALC 131 (H) 06/24/2024 1129     Risk Assessment/Calculations:                Physical Exam:    VS:  BP 116/86   Pulse 86   Ht 5' 4 (1.626 m)   Wt 232 lb 6.4 oz (105.4 kg)   SpO2 96%   BMI 39.89 kg/m     Wt Readings from Last 3 Encounters:  08/28/24 232 lb 6.4 oz (105.4 kg)  07/31/24 240 lb 9.6 oz (109.1 kg)  07/25/24 237 lb 6 oz (107.7 kg)     GEN:  Well nourished, well developed in no acute distress HEENT: Normal NECK: No JVD; No carotid bruits LYMPHATICS: No lymphadenopathy CARDIAC: RRR, no murmurs, rubs, gallops RESPIRATORY:  Clear to auscultation without rales, wheezing or rhonchi  ABDOMEN: Soft, non-tender, non-distended MUSCULOSKELETAL:  No edema; No deformity  SKIN: Warm and dry NEUROLOGIC:  Alert and oriented x 3 PSYCHIATRIC:  Normal affect   ASSESSMENT:    1. Primary hypertension   2.  Hyperlipidemia LDL goal <70   3. Gastroesophageal reflux disease, unspecified whether esophagitis present   4. Chest pain  of uncertain etiology   5. Exercise-induced asthma   6. Obesity with body mass index 30 or greater    PLAN:    In order of problems listed above:  Hypertension - Continue 5 mg amlodipine  - May consider ARB given prediabetes -- BP well controlled, no changes -- likely will be able to reduce this with weight loss -- hand swelling - could be related to amlodipine , if persistent would trial ARB instead of amlodipine  given A1c   Chest discomfort with exertion - Reassuring echocardiogram, CT coronary, and PET stress test - chest discomfort resolved with better GERD control, this is great news   GERD - Referred to GI, now on voquenza instead of omeprazole     Exercise-induced asthma suspected - Referred to pulmonology   Hyperlipidemia with LDL less than 70 06/24/2024: Cholesterol 203; HDL 50.60; LDL Cholesterol 115; Triglycerides 191.0; VLDL 38.2 - Has not yet started rosuvastatin  - she wants to work on weight loss first - will plan to recheck lipid panel in April 2026   Follow up in 6 months, then likely PRN after that.            Medication Adjustments/Labs and Tests Ordered: Current medicines are reviewed at length with the patient today.  Concerns regarding medicines are outlined above.  Orders Placed This Encounter  Procedures   Lipid panel   No orders of the defined types were placed in this encounter.   Patient Instructions  Medication Instructions:  Your physician recommends that you continue on your current medications as directed. Please refer to the Current Medication list given to you today.  *If you need a refill on your cardiac medications before your next appointment, please call your pharmacy*  Lab Work: Fasting Lipid Panel in April If you have labs (blood work) drawn today and your tests are completely normal, you will receive  your results only by: MyChart Message (if you have MyChart) OR A paper copy in the mail If you have any lab test that is abnormal or we need to change your treatment, we will call you to review the results.  Testing/Procedures: None ordered  Follow-Up: At [facility], you and your health needs are our priority.  As part of our continuing mission to provide you with exceptional heart care, our providers are all part of one team.  This team includes your primary Cardiologist (physician) and Advanced Practice Providers or APPs (Physician Assistants and Nurse Practitioners) who all work together to provide you with the care you need, when you need it.  Your next appointment:   6 month(s)  Provider:   Lonni Cash, MD    We recommend signing up for the patient portal called MyChart.  Sign up information is provided on this After Visit Summary.  MyChart is used to connect with patients for Virtual Visits (Telemedicine).  Patients are able to view lab/test results, encounter notes, upcoming appointments, etc.  Non-urgent messages can be sent to your provider as well.   To learn more about what you can do with MyChart, go to [email or web address].              Signed, Jon Nat Hails, PA  08/28/2024 8:41 AM    [facility]

## 2024-08-20 ENCOUNTER — Encounter: Payer: Self-pay | Admitting: Internal Medicine

## 2024-08-20 ENCOUNTER — Other Ambulatory Visit (HOSPITAL_COMMUNITY): Payer: Self-pay

## 2024-08-20 ENCOUNTER — Encounter: Payer: Self-pay | Admitting: Allergy & Immunology

## 2024-08-20 DIAGNOSIS — Z579 Occupational exposure to unspecified risk factor: Secondary | ICD-10-CM

## 2024-08-20 MED ORDER — TACROLIMUS 0.1 % EX OINT
1.0000 | TOPICAL_OINTMENT | Freq: Two times a day (BID) | CUTANEOUS | 6 refills | Status: AC
  Filled 2024-08-20: qty 30, 15d supply, fill #0
  Filled 2024-09-02: qty 30, 15d supply, fill #1

## 2024-08-20 NOTE — Telephone Encounter (Signed)
 Do you recall if this the Milk, Wheat, and Pistachio was tested and maybe it just was accidentally not put into the flow sheet?

## 2024-08-21 DIAGNOSIS — Z579 Occupational exposure to unspecified risk factor: Secondary | ICD-10-CM | POA: Insufficient documentation

## 2024-08-28 ENCOUNTER — Encounter: Payer: Self-pay | Admitting: Physician Assistant

## 2024-08-28 ENCOUNTER — Ambulatory Visit: Attending: Cardiology | Admitting: Physician Assistant

## 2024-08-28 VITALS — BP 116/86 | HR 86 | Ht 64.0 in | Wt 232.4 lb

## 2024-08-28 DIAGNOSIS — R079 Chest pain, unspecified: Secondary | ICD-10-CM | POA: Diagnosis not present

## 2024-08-28 DIAGNOSIS — K219 Gastro-esophageal reflux disease without esophagitis: Secondary | ICD-10-CM | POA: Diagnosis not present

## 2024-08-28 DIAGNOSIS — E669 Obesity, unspecified: Secondary | ICD-10-CM

## 2024-08-28 DIAGNOSIS — J45990 Exercise induced bronchospasm: Secondary | ICD-10-CM

## 2024-08-28 DIAGNOSIS — E785 Hyperlipidemia, unspecified: Secondary | ICD-10-CM

## 2024-08-28 DIAGNOSIS — I10 Essential (primary) hypertension: Secondary | ICD-10-CM | POA: Diagnosis not present

## 2024-08-28 NOTE — Patient Instructions (Signed)
 Medication Instructions:  Your physician recommends that you continue on your current medications as directed. Please refer to the Current Medication list given to you today.  *If you need a refill on your cardiac medications before your next appointment, please call your pharmacy*  Lab Work: Fasting Lipid Panel in April If you have labs (blood work) drawn today and your tests are completely normal, you will receive your results only by: MyChart Message (if you have MyChart) OR A paper copy in the mail If you have any lab test that is abnormal or we need to change your treatment, we will call you to review the results.  Testing/Procedures: None ordered  Follow-Up: At [facility], you and your health needs are our priority.  As part of our continuing mission to provide you with exceptional heart care, our providers are all part of one team.  This team includes your primary Cardiologist (physician) and Advanced Practice Providers or APPs (Physician Assistants and Nurse Practitioners) who all work together to provide you with the care you need, when you need it.  Your next appointment:   6 month(s)  Provider:   Lonni Cash, MD    We recommend signing up for the patient portal called MyChart.  Sign up information is provided on this After Visit Summary.  MyChart is used to connect with patients for Virtual Visits (Telemedicine).  Patients are able to view lab/test results, encounter notes, upcoming appointments, etc.  Non-urgent messages can be sent to your provider as well.   To learn more about what you can do with MyChart, go to [email or web address].

## 2024-09-02 ENCOUNTER — Other Ambulatory Visit: Payer: Self-pay

## 2024-09-05 NOTE — Progress Notes (Unsigned)
 Chief Complaint: Primary GI MD: Dr. Albertus  HPI:  *** is a  ***  who was referred to me by Jesus Bernardino MATSU, MD for a complaint of *** .     Discussed the use of AI scribe software for clinical note transcription with the patient, who gave verbal consent to proceed.  History of Present Illness      PREVIOUS GI WORKUP   EGD 06/16/2020 treatment for dysphagia and heartburn with Dr Rollin - Esophagus was normal - Large amount of food residue in stomach - Food residue in duodenum - Active regurgitation of refluxate was noted in the distal esophagus.  Dilation not possible due to food. - no specimens collects   Past Medical History:  Diagnosis Date   Abnormal cervical Papanicolaou smear 06/24/2024   2007 COLPO/ CRYO     Abnormal menstrual periods 01/17/2022   Allergy     Anxiety    Back pain at L4-L5 level    Blood transfusion without reported diagnosis 04/2005   Auto Transfusion Post Lumbar Laminectomy/Discectomy   BMI 30.0-30.9,adult    Chest pain    Constipation    Delayed pressure urticaria dx age 80   trauma to skin causes deep tissue  swelling   Depression    Dysphonia 07/03/2018   GERD (gastroesophageal reflux disease)    Hyperglycemia    Hyperlipidemia    Laryngopharyngeal reflux    Lower back pain    l4 to l5   Obesity    Pain in joint of left shoulder 02/10/2022   Right thyroid  nodule    Seasonal allergies    Thyroid  nodule 05/10/2016   Right lobe FNA negative. Followed by ENT S/P right thyroidectomy 5/19, U/S 12/04/19 negative no nodules, 02/05/21 negative/no nodules     TMJ (dislocation of temporomandibular joint)    Urticaria    Vitamin D  deficiency     Past Surgical History:  Procedure Laterality Date   LUMBAR LAMINECTOMY  2006   L4 and L5 discectomy micro, hemilaminectomy, had conscious sedation for    SPINE SURGERY  04/2005   Micro L4-L5 Lumbar Laminectomy/Discectomy   THYROID  LOBECTOMY Right 02/01/2018   Procedure: RIGHT THYROID  LOBECTOMY;   Surgeon: Eletha Boas, MD;  Location: [facility];  Service: General;  Laterality: Right;   WISDOM TOOTH EXTRACTION  2001    Current Outpatient Medications  Medication Sig Dispense Refill   albuterol  (VENTOLIN  HFA) 108 (90 Base) MCG/ACT inhaler Inhale 2 puffs into the lungs every 6 (six) hours as needed for wheezing or shortness of breath. 6.7 g 6   amLODipine  (NORVASC ) 5 MG tablet Take 1 tablet (5 mg total) by mouth daily. 90 tablet 3   Ascorbic Acid (VITAMIN C) 1000 MG tablet Take 1,000 mg by mouth at bedtime.     cetirizine (ZYRTEC) 10 MG tablet Take 10 mg by mouth at bedtime.      diphenhydrAMINE  (BENADRYL ) 2 % cream Apply 1 application topically 3 (three) times daily as needed (for hives.).     diphenhydrAMINE  (BENADRYL ) 25 mg capsule Take 25-50 mg by mouth every 6 (six) hours as needed (for hives.).      ketoconazole  (NIZORAL ) 2 % cream Apply to affected areas 1-2 times daily as needed for flares: mix with triamcinolone  cream 30 g 5   meloxicam  (MOBIC ) 7.5 MG tablet Take 1 tablet (7.5 mg total) by mouth as needed. 90 tablet 4   metFORMIN  (GLUCOPHAGE -XR) 500 MG 24 hr tablet TAKE 1 TABLET BY MOUTH ONCE DAILY 90 tablet  3   norethindrone -ethinyl estradiol -FE (JUNEL  FE 1/20) 1-20 MG-MCG tablet Take 1 tablet by mouth daily, taking contiously skipping placebo pills 112 tablet 4   norethindrone -ethinyl estradiol -FE (JUNEL  FE 1/20) 1-20 MG-MCG tablet Take 1 tablet by mouth daily, taking contiously skipping placebo pills 112 tablet 4   nystatin  (MYCOSTATIN /NYSTOP ) powder APPLY TO THE AFFECTED AREA(S) 2 TIMES PER DAY AS DIRECTED. 60 g 1   omeprazole  (PRILOSEC) 40 MG capsule Take 1 capsule (40 mg total) by mouth 2 (two) times daily. (Patient not taking: Reported on 08/28/2024) 180 capsule 4   rosuvastatin  (CRESTOR ) 10 MG tablet Take 1 tablet (10 mg total) by mouth daily. (Patient not taking: Reported on 08/28/2024) 90 tablet 3   tacrolimus  (PROTOPIC ) 0.1 % ointment Apply to affected areas twice daily for 2  to 4 weeks as needed for flares 60 g 6   tirzepatide  (ZEPBOUND ) 2.5 MG/0.5ML injection vial Inject 0.5 mL every week by subcutaneous route for 28 days. (Patient not taking: Reported on 08/28/2024)     triamcinolone  (KENALOG ) 0.025 % cream Apply to affected area daily for 2 weeks as needed for flares (mix with ketoconazole ) not safe for long term use 15 g 3   VITAMIN D  PO Take 2,000 Units by mouth daily.     Vonoprazan Fumarate  (VOQUEZNA ) 10 MG TABS Take 1 tablet by mouth daily. 30 tablet 1   No current facility-administered medications for this visit.    Allergies as of 09/06/2024 - Review Complete 08/28/2024  Allergen Reaction Noted   Aspirin Swelling and Other (See Comments) 01/26/2016   Chlorhexidine  Dermatitis and Rash 01/26/2016   Esomeprazole Hives 10/15/2020   Nsaids Other (See Comments) 01/26/2016   Chlorhexidine  gluconate Other (See Comments) and Rash 07/18/2022   Esomeprazole magnesium Rash 07/18/2022   Iodinated contrast media Hives 01/23/2018    Family History  Problem Relation Age of Onset   Diabetes type II Mother    Heart disease Mother        CHF/CAD   Sleep apnea Mother    Kidney failure Mother    Anxiety disorder Mother    Depression Mother    Diabetes Mother    Hearing loss Mother    Hyperlipidemia Mother    Hypertension Mother    Kidney disease Mother    Obesity Mother    Varicose Veins Mother    Tongue cancer Father    Glaucoma Father    Heart disease Father        CAD   Cancer Father    Hyperlipidemia Father    Hypertension Father    Obesity Father    Diabetes type II Brother    Cataracts Brother    Hypertension Brother    Anxiety disorder Brother    Depression Brother    Heart attack Maternal Grandmother    Heart disease Maternal Grandmother    Hypertension Maternal Grandmother    Other Maternal Grandfather        worked in haematologist mine   Colon cancer Paternal Grandmother    Diabetes Paternal Grandfather    Heart disease Paternal Grandfather     Hypertension Paternal Grandfather    Anxiety disorder Brother    Depression Brother    Diabetes Brother    Hyperlipidemia Brother    Hypertension Brother    Obesity Brother    Allergic rhinitis Neg Hx    Angioedema Neg Hx    Asthma Neg Hx    Eczema Neg Hx    Immunodeficiency Neg Hx  Urticaria Neg Hx     Social History   Socioeconomic History   Marital status: Married    Spouse name: Not on file   Number of children: 0   Years of education: college   Highest education level: Master's degree (e.g., MA, MS, MEng, MEd, MSW, MBA)  Occupational History   Occupation: [employer]   Occupation: Engineer, Civil (consulting) works for [employer]  Tobacco Use   Smoking status: Never    Passive exposure: Never   Smokeless tobacco: Never  Vaping Use   Vaping status: Never Used  Substance and Sexual Activity   Alcohol use: No   Drug use: No   Sexual activity: Not Currently    Birth control/protection: Pill  Other Topics Concern   Not on file  Social History Narrative   Lives at home with her husband.   Right-handed.   1 cup coffee per day.   Social Drivers of Health   Tobacco Use: Low Risk (08/28/2024)   Patient History    Smoking Tobacco Use: Never    Smokeless Tobacco Use: Never    Passive Exposure: Never  Financial Resource Strain: Low Risk (06/23/2024)   Overall Financial Resource Strain (CARDIA)    Difficulty of Paying Living Expenses: Not hard at all  Food Insecurity: No Food Insecurity (06/23/2024)   Epic    Worried About Programme Researcher, Broadcasting/film/video in the Last Year: Never true    Ran Out of Food in the Last Year: Never true  Transportation Needs: No Transportation Needs (06/23/2024)   Epic    Lack of Transportation (Medical): No    Lack of Transportation (Non-Medical): No  Physical Activity: Inactive (06/23/2024)   Exercise Vital Sign    Days of Exercise per Week: 0 days    Minutes of Exercise per Session: Not on file  Stress: Stress Concern Present (06/23/2024)   [employer] of Occupational  Health - Occupational Stress Questionnaire    Feeling of Stress: Rather much  Social Connections: Socially Isolated (06/23/2024)   Social Connection and Isolation Panel    Frequency of Communication with Friends and Family: Once a week    Frequency of Social Gatherings with Friends and Family: Once a week    Attends Religious Services: Never    Database Administrator or Organizations: No    Attends Engineer, Structural: Not on file    Marital Status: Married  Catering Manager Violence: Not At Risk (02/05/2024)   Received from [facility]   HITS    Over the last 12 months how often did your partner physically hurt you?: Never    Over the last 12 months how often did your partner insult you or talk down to you?: Never    Over the last 12 months how often did your partner threaten you with physical harm?: Never    Over the last 12 months how often did your partner scream or curse at you?: Never  Depression (PHQ2-9): Medium Risk (06/24/2024)   Depression (PHQ2-9)    PHQ-2 Score: 5  Alcohol Screen: Not on file  Housing: Low Risk (06/23/2024)   Epic    Unable to Pay for Housing in the Last Year: No    Number of Times Moved in the Last Year: 0    Homeless in the Last Year: No  Utilities: Not At Risk (02/05/2024)   Received from [facility] Utilities    Threatened with loss of utilities: No  Health Literacy: Not on file  Review of Systems:    Constitutional: No weight loss, fever, chills, weakness or fatigue HEENT: Eyes: No change in vision               Ears, Nose, Throat:  No change in hearing or congestion Skin: No rash or itching Cardiovascular: No chest pain, chest pressure or palpitations   Respiratory: No SOB or cough Gastrointestinal: See HPI and otherwise negative Genitourinary: No dysuria or change in urinary frequency Neurological: No headache, dizziness or syncope Musculoskeletal: No new muscle or joint pain Hematologic: No bleeding or  bruising Psychiatric: No history of depression or anxiety    Physical Exam:  Vital signs: There were no vitals taken for this visit.  Constitutional: NAD, alert and cooperative Head:  Normocephalic and atraumatic. Eyes:   PEERL, EOMI. No icterus. Conjunctiva pink. Respiratory: Respirations even and unlabored. Lungs clear to auscultation bilaterally.   No wheezes, crackles, or rhonchi.  Cardiovascular:  Regular rate and rhythm. No peripheral edema, cyanosis or pallor.  Gastrointestinal:  Soft, nondistended, nontender. No rebound or guarding. Normal bowel sounds. No appreciable masses or hepatomegaly. Rectal:  Declines Msk:  Symmetrical without gross deformities. Without edema, no deformity or joint abnormality.  Neurologic:  Alert and  oriented x4;  grossly normal neurologically.  Skin:   Dry and intact without significant lesions or rashes. Psychiatric: Oriented to person, place and time. Demonstrates good judgement and reason without abnormal affect or behaviors.  Physical Exam    RELEVANT LABS AND IMAGING: CBC    Component Value Date/Time   WBC 8.1 06/24/2024 1429   RBC 4.40 06/24/2024 1429   HGB 13.1 06/24/2024 1429   HGB 12.9 06/04/2019 0909   HCT 38.9 06/24/2024 1429   HCT 39.4 06/04/2019 0909   PLT 304.0 06/24/2024 1429   MCV 88.5 06/24/2024 1429   MCV 88 06/04/2019 0909   MCH 29.2 10/15/2020 0934   MCHC 33.7 06/24/2024 1429   RDW 13.1 06/24/2024 1429   RDW 13.5 06/04/2019 0909   LYMPHSABS 3.1 06/24/2024 1429   LYMPHSABS 1.8 06/04/2019 0909   MONOABS 0.4 06/24/2024 1429   EOSABS 0.1 06/24/2024 1429   EOSABS 0.1 06/04/2019 0909   BASOSABS 0.0 06/24/2024 1429   BASOSABS 0.1 06/04/2019 0909    CMP     Component Value Date/Time   NA 140 06/24/2024 1429   NA 140 06/24/2024 1129   K 3.9 06/24/2024 1429   CL 102 06/24/2024 1429   CO2 24 06/24/2024 1429   GLUCOSE 84 06/24/2024 1429   GLUCOSE 85 06/24/2024 1129   BUN 8 06/24/2024 1429   BUN 9 06/24/2024 1129    CREATININE 0.55 06/24/2024 1429   CREATININE 0.69 10/15/2020 0934   CALCIUM  9.2 06/24/2024 1429   PROT 7.1 06/24/2024 1429   PROT 6.8 06/24/2024 1129   ALBUMIN 4.2 06/24/2024 1429   ALBUMIN 4.1 06/24/2024 1129   AST 21 06/24/2024 1429   ALT 24 06/24/2024 1429   ALKPHOS 47 06/24/2024 1429   BILITOT 0.3 06/24/2024 1429   BILITOT 0.3 06/24/2024 1129   GFRNONAA >60 07/13/2023 1813   GFRNONAA 110 10/15/2020 0934   GFRAA 128 10/15/2020 0934     Assessment/Plan:   GERD Longstanding gerd with previous negative EGD 2021 with active reflux, food in stomach, otherwise normal. Failed omeprazole  BID. Characterized by retrosternal chest pain and occur with eating/on exertion, regurgitation, burning.  Occasional NSAID use with Mobic . Put on voquezna  10mg  - Provided samples of Voquezna  10 mg - Educated patient on lifestyle modifications  provided patient education handout - Follow-up 6 to 8 weeks   Chronic constipation presenting as overflow with urgency Longstanding constipation since childhood. Previous colonoscopy in 2015.  Normal TSH. 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  LFTs have been normal since October 2024.  BMI of 40.  Currently discussing Zepbound  for weight loss with PCP. FIB 4 score 0.58 excluding advanced fibrosis - Educated patient on MASH and recommend diet and weight loss.  Not a candidate for Rezzdiffra at this time - Recent LFTs normal.   Chest discomfort with exertion Follows with cardiology. echocardiogram 05/2022 with preserved LVEF 60-65% and no valvular disease.  Normal EKG.  Recent negative stress test    Nestor Blower, PA-C [facility] 09/05/2024, 12:36 PM  Cc: Jesus Bernardino MATSU, MD

## 2024-09-06 ENCOUNTER — Other Ambulatory Visit: Payer: Self-pay

## 2024-09-06 ENCOUNTER — Encounter: Payer: Self-pay | Admitting: Gastroenterology

## 2024-09-06 ENCOUNTER — Other Ambulatory Visit (HOSPITAL_COMMUNITY): Payer: Self-pay

## 2024-09-06 ENCOUNTER — Ambulatory Visit: Admitting: Gastroenterology

## 2024-09-06 VITALS — BP 126/80 | HR 82 | Ht 64.0 in | Wt 233.0 lb

## 2024-09-06 DIAGNOSIS — Z800 Family history of malignant neoplasm of digestive organs: Secondary | ICD-10-CM

## 2024-09-06 DIAGNOSIS — R198 Other specified symptoms and signs involving the digestive system and abdomen: Secondary | ICD-10-CM

## 2024-09-06 DIAGNOSIS — K7581 Nonalcoholic steatohepatitis (NASH): Secondary | ICD-10-CM | POA: Diagnosis not present

## 2024-09-06 DIAGNOSIS — K5909 Other constipation: Secondary | ICD-10-CM | POA: Diagnosis not present

## 2024-09-06 DIAGNOSIS — K760 Fatty (change of) liver, not elsewhere classified: Secondary | ICD-10-CM

## 2024-09-06 DIAGNOSIS — K219 Gastro-esophageal reflux disease without esophagitis: Secondary | ICD-10-CM

## 2024-09-06 MED ORDER — SUTAB 1479-225-188 MG PO TABS
ORAL_TABLET | ORAL | 0 refills | Status: AC
  Filled 2024-09-06: qty 24, 1d supply, fill #0

## 2024-09-06 MED ORDER — NA SULFATE-K SULFATE-MG SULF 17.5-3.13-1.6 GM/177ML PO SOLN
1.0000 | Freq: Once | ORAL | 0 refills | Status: DC
  Filled 2024-09-06: qty 354, 1d supply, fill #0

## 2024-09-06 NOTE — Patient Instructions (Addendum)
 Your provider has requested that you go to the basement level for lab work before leaving today. Press B on the elevator. The lab is located at the first door on the left as you exit the elevator.  Due to recent changes in healthcare laws, you may see the results of your imaging and laboratory studies on MyChart before your provider has had a chance to review them.  We understand that in some cases there may be results that are confusing or concerning to you. Not all laboratory results come back in the same time frame and the provider may be waiting for multiple results in order to interpret others.  Please give us  48 hours in order for your provider to thoroughly review all the results before contacting the office for clarification of your results.    You have been scheduled for an endoscopy and colonoscopy. Please follow written instructions given to you at your visit today.  If you use inhalers (even only as needed), please bring them with you on the day of your procedure.  If you take any of the following medications, they will need to be adjusted prior to your procedure:   DO NOT TAKE 7 DAYS PRIOR TO TEST- Trulicity (dulaglutide) Ozempic, Wegovy (semaglutide) Mounjaro , Zepbound  (tirzepatide ) Bydureon Bcise (exanatide extended release)  DO NOT TAKE 1 DAY PRIOR TO YOUR TEST Rybelsus (semaglutide) Adlyxin (lixisenatide) Victoza (liraglutide) Byetta (exanatide) ___________________________________________________________________________   Thank you for trusting me with your gastrointestinal care!   Nestor Blower, PA   _______________________________________________________  If your blood pressure at your visit was 140/90 or greater, please contact your primary care physician to follow up on this.  _______________________________________________________  If you are age 40 or older, your body mass index should be between 23-30. Your Body mass index is 39.99 kg/m. If this is  out of the aforementioned range listed, please consider follow up with your Primary Care Provider.  If you are age 13 or younger, your body mass index should be between 19-25. Your Body mass index is 39.99 kg/m. If this is out of the aformentioned range listed, please consider follow up with your Primary Care Provider.   ________________________________________________________  [facility] providers would like to encourage you to use MYCHART to communicate with providers for non-urgent requests or questions.  Due to long hold times on the telephone, sending your provider a message by [facility] may be a faster and more efficient way to get a response.  Please allow 48 business hours for a response.  Please remember that this is for non-urgent requests.  _______________________________________________________  [facility] is using a team-based approach to care.  Your team is made up of your doctor and two to three APPS. Our APPS (Nurse Practitioners and Physician Assistants) work with your physician to ensure care continuity for you. They are fully qualified to address your health concerns and develop a treatment plan. They communicate directly with your gastroenterologist to care for you. Seeing the Advanced Practice Practitioners on your physician's team can help you by facilitating care more promptly, often allowing for earlier appointments, access to diagnostic testing, procedures, and other specialty referrals.

## 2024-09-09 ENCOUNTER — Other Ambulatory Visit

## 2024-09-09 DIAGNOSIS — R198 Other specified symptoms and signs involving the digestive system and abdomen: Secondary | ICD-10-CM

## 2024-09-09 DIAGNOSIS — K760 Fatty (change of) liver, not elsewhere classified: Secondary | ICD-10-CM

## 2024-09-09 DIAGNOSIS — K219 Gastro-esophageal reflux disease without esophagitis: Secondary | ICD-10-CM

## 2024-09-12 LAB — CALPROTECTIN, FECAL: Calprotectin, Fecal: 62 ug/g (ref 0–120)

## 2024-09-17 ENCOUNTER — Ambulatory Visit: Payer: Self-pay | Admitting: Gastroenterology

## 2024-09-23 ENCOUNTER — Encounter: Admitting: Internal Medicine

## 2024-09-24 ENCOUNTER — Other Ambulatory Visit (HOSPITAL_COMMUNITY): Payer: Self-pay

## 2024-09-25 NOTE — Telephone Encounter (Signed)
 PA team-  Patient got new insurance for 2026 (see attached). Can you help to find out if her new insurance will require PA for Voquezna ?

## 2024-09-26 ENCOUNTER — Ambulatory Visit: Admitting: Internal Medicine

## 2024-09-26 ENCOUNTER — Other Ambulatory Visit (HOSPITAL_COMMUNITY): Payer: Self-pay

## 2024-09-26 NOTE — Telephone Encounter (Signed)
 From what I can see on the test claim, there's a prior authorization in effect until 05-05-2025 and the current co-pay for #30 tablets per 30 days is $60.00

## 2024-09-30 ENCOUNTER — Other Ambulatory Visit: Payer: Self-pay

## 2024-09-30 ENCOUNTER — Other Ambulatory Visit (HOSPITAL_BASED_OUTPATIENT_CLINIC_OR_DEPARTMENT_OTHER): Payer: Self-pay

## 2024-09-30 ENCOUNTER — Other Ambulatory Visit: Payer: Self-pay | Admitting: Gastroenterology

## 2024-09-30 DIAGNOSIS — K219 Gastro-esophageal reflux disease without esophagitis: Secondary | ICD-10-CM

## 2024-09-30 MED ORDER — PANTOPRAZOLE SODIUM 40 MG PO TBEC
40.0000 mg | DELAYED_RELEASE_TABLET | Freq: Two times a day (BID) | ORAL | 0 refills | Status: DC
  Filled 2024-09-30: qty 180, 90d supply, fill #0

## 2024-10-01 ENCOUNTER — Other Ambulatory Visit (HOSPITAL_COMMUNITY): Payer: Self-pay

## 2024-10-03 ENCOUNTER — Ambulatory Visit: Admitting: Allergy & Immunology

## 2024-10-11 ENCOUNTER — Other Ambulatory Visit (HOSPITAL_COMMUNITY): Payer: Self-pay

## 2024-10-14 ENCOUNTER — Other Ambulatory Visit (HOSPITAL_COMMUNITY): Payer: Self-pay

## 2024-10-14 ENCOUNTER — Other Ambulatory Visit: Payer: Self-pay

## 2024-10-15 ENCOUNTER — Other Ambulatory Visit: Payer: Self-pay

## 2024-10-15 ENCOUNTER — Encounter (HOSPITAL_COMMUNITY): Payer: Self-pay

## 2024-10-15 MED ORDER — VOQUEZNA 10 MG PO TABS
1.0000 | ORAL_TABLET | Freq: Every day | ORAL | 6 refills | Status: AC
  Filled 2024-10-15: qty 30, 30d supply, fill #0

## 2024-10-21 ENCOUNTER — Other Ambulatory Visit (HOSPITAL_COMMUNITY): Payer: Self-pay

## 2024-10-22 ENCOUNTER — Other Ambulatory Visit (HOSPITAL_COMMUNITY): Payer: Self-pay

## 2024-10-22 ENCOUNTER — Other Ambulatory Visit: Payer: Self-pay

## 2024-10-22 MED ORDER — METFORMIN HCL ER 500 MG PO TB24
500.0000 mg | ORAL_TABLET | Freq: Every day | ORAL | 3 refills | Status: AC
  Filled 2024-10-22: qty 90, 90d supply, fill #0

## 2024-10-29 ENCOUNTER — Encounter: Admitting: Internal Medicine

## 2025-02-18 ENCOUNTER — Encounter: Admitting: Internal Medicine
